# Patient Record
Sex: Female | Born: 1945
Health system: Southern US, Community
[De-identification: ages and names within clinical notes are randomized; demographics above are authoritative.]

## PROBLEM LIST (undated history)

## (undated) DIAGNOSIS — R609 Edema, unspecified: Secondary | ICD-10-CM

## (undated) DIAGNOSIS — I10 Essential (primary) hypertension: Secondary | ICD-10-CM

## (undated) DIAGNOSIS — E119 Type 2 diabetes mellitus without complications: Secondary | ICD-10-CM

## (undated) DIAGNOSIS — R42 Dizziness and giddiness: Secondary | ICD-10-CM

## (undated) DIAGNOSIS — R079 Chest pain, unspecified: Secondary | ICD-10-CM

## (undated) HISTORY — DX: Edema, unspecified: R60.9

## (undated) HISTORY — DX: Dizziness and giddiness: R42

## (undated) HISTORY — PX: OTHER SURGICAL HISTORY: SHX169

## (undated) HISTORY — DX: Type 2 diabetes mellitus without complications: E11.9

## (undated) HISTORY — DX: Essential (primary) hypertension: I10

## (undated) HISTORY — DX: Chest pain, unspecified: R07.9

## (undated) HISTORY — PX: TOTAL ABDOMINAL HYSTERECTOMY W/ BILATERAL SALPINGOOPHORECTOMY: SHX83

---

## 1998-12-06 ENCOUNTER — Ambulatory Visit (HOSPITAL_COMMUNITY): Admission: RE | Admit: 1998-12-06 | Discharge: 1998-12-06 | Payer: Self-pay | Admitting: Internal Medicine

## 1998-12-06 ENCOUNTER — Encounter: Payer: Self-pay | Admitting: Internal Medicine

## 1998-12-27 ENCOUNTER — Encounter: Admission: RE | Admit: 1998-12-27 | Discharge: 1999-03-27 | Payer: Self-pay | Admitting: Internal Medicine

## 2004-03-27 ENCOUNTER — Ambulatory Visit: Payer: Self-pay | Admitting: Internal Medicine

## 2004-04-02 ENCOUNTER — Ambulatory Visit: Payer: Self-pay | Admitting: Internal Medicine

## 2004-08-06 ENCOUNTER — Ambulatory Visit: Payer: Self-pay | Admitting: Internal Medicine

## 2004-10-17 ENCOUNTER — Ambulatory Visit: Payer: Self-pay | Admitting: Internal Medicine

## 2005-03-11 ENCOUNTER — Ambulatory Visit: Payer: Self-pay | Admitting: Internal Medicine

## 2005-03-14 ENCOUNTER — Ambulatory Visit: Payer: Self-pay

## 2006-07-22 ENCOUNTER — Ambulatory Visit: Payer: Self-pay | Admitting: Internal Medicine

## 2006-07-22 LAB — CONVERTED CEMR LAB
ALT: 12 units/L (ref 0–40)
AST: 15 units/L (ref 0–37)
Albumin: 3.4 g/dL — ABNORMAL LOW (ref 3.5–5.2)
Alkaline Phosphatase: 54 units/L (ref 39–117)
BUN: 14 mg/dL (ref 6–23)
Basophils Absolute: 0.1 10*3/uL (ref 0.0–0.1)
Basophils Relative: 1.7 % — ABNORMAL HIGH (ref 0.0–1.0)
Bilirubin Urine: NEGATIVE
Bilirubin, Direct: 0.1 mg/dL (ref 0.0–0.3)
CO2: 35 meq/L — ABNORMAL HIGH (ref 19–32)
Calcium: 9.8 mg/dL (ref 8.4–10.5)
Chloride: 106 meq/L (ref 96–112)
Cholesterol: 145 mg/dL (ref 0–200)
Creatinine, Ser: 0.7 mg/dL (ref 0.4–1.2)
Eosinophils Absolute: 0.1 10*3/uL (ref 0.0–0.6)
Eosinophils Relative: 2 % (ref 0.0–5.0)
GFR calc Af Amer: 110 mL/min
GFR calc non Af Amer: 91 mL/min
Glucose, Bld: 150 mg/dL — ABNORMAL HIGH (ref 70–99)
HCT: 36.6 % (ref 36.0–46.0)
HDL: 52.5 mg/dL (ref >39.0)
Hemoglobin, Urine: NEGATIVE
Hemoglobin: 12.2 g/dL (ref 12.0–15.0)
Ketones, ur: NEGATIVE mg/dL
LDL Cholesterol: 81 mg/dL (ref 0–99)
Leukocytes, UA: NEGATIVE
Lymphocytes Relative: 34.9 % (ref 12.0–46.0)
MCHC: 33.3 g/dL (ref 30.0–36.0)
MCV: 76.6 fL — ABNORMAL LOW (ref 78.0–100.0)
Monocytes Absolute: 0.4 10*3/uL (ref 0.2–0.7)
Monocytes Relative: 7.2 % (ref 3.0–11.0)
Neutro Abs: 3 10*3/uL (ref 1.4–7.7)
Neutrophils Relative %: 54.2 % (ref 43.0–77.0)
Nitrite: NEGATIVE
Platelets: 272 10*3/uL (ref 150–400)
Potassium: 4 meq/L (ref 3.5–5.1)
RBC: 4.78 M/uL (ref 3.87–5.11)
RDW: 17.2 % — ABNORMAL HIGH (ref 11.5–14.6)
Sodium: 146 meq/L — ABNORMAL HIGH (ref 135–145)
Specific Gravity, Urine: >=1.03 (ref 1.000–1.03)
TSH: 2.21 microintl units/mL (ref 0.35–5.50)
Total Bilirubin: 0.6 mg/dL (ref 0.3–1.2)
Total CHOL/HDL Ratio: 2.8
Total Protein, Urine: NEGATIVE mg/dL
Total Protein: 6.7 g/dL (ref 6.0–8.3)
Triglycerides: 60 mg/dL (ref 0–149)
Urine Glucose: NEGATIVE mg/dL
Urobilinogen, UA: 0.2 (ref 0.0–1.0)
VLDL: 12 mg/dL (ref 0–40)
WBC: 5.6 10*3/uL (ref 4.5–10.5)
pH: 5.5 (ref 5.0–8.0)

## 2006-07-30 ENCOUNTER — Ambulatory Visit: Payer: Self-pay | Admitting: Internal Medicine

## 2006-07-30 LAB — CONVERTED CEMR LAB
Creatinine,U: 178.5 mg/dL
Hgb A1c MFr Bld: 7.1 % — ABNORMAL HIGH (ref 4.6–6.0)
Microalb Creat Ratio: 2.2 mg/g (ref 0.0–30.0)
Microalb, Ur: 0.4 mg/dL (ref 0.0–1.9)

## 2006-10-16 ENCOUNTER — Ambulatory Visit: Payer: Self-pay | Admitting: Internal Medicine

## 2006-10-30 ENCOUNTER — Ambulatory Visit: Payer: Self-pay | Admitting: Internal Medicine

## 2006-10-30 HISTORY — PX: COLONOSCOPY: SHX174

## 2006-10-30 LAB — HM COLONOSCOPY

## 2007-01-29 ENCOUNTER — Ambulatory Visit: Payer: Self-pay | Admitting: Internal Medicine

## 2007-01-29 LAB — CONVERTED CEMR LAB
BUN: 13 mg/dL (ref 6–23)
Bilirubin Urine: NEGATIVE
CO2: 32 meq/L (ref 19–32)
Calcium: 10 mg/dL (ref 8.4–10.5)
Chloride: 102 meq/L (ref 96–112)
Creatinine, Ser: 0.6 mg/dL (ref 0.4–1.2)
Creatinine,U: 91.9 mg/dL
Crystals: NEGATIVE
GFR calc Af Amer: 131 mL/min
GFR calc non Af Amer: 108 mL/min
Glucose, Bld: 70 mg/dL (ref 70–99)
Hemoglobin, Urine: NEGATIVE
Hgb A1c MFr Bld: 6.9 % — ABNORMAL HIGH (ref 4.6–6.0)
Ketones, ur: NEGATIVE mg/dL
Leukocytes, UA: NEGATIVE
Microalb Creat Ratio: 13.1 mg/g (ref 0.0–30.0)
Microalb, Ur: 1.2 mg/dL (ref 0.0–1.9)
Mucus, UA: NEGATIVE
Nitrite: NEGATIVE
Potassium: 3.8 meq/L (ref 3.5–5.1)
Sodium: 141 meq/L (ref 135–145)
Specific Gravity, Urine: 1.025 (ref 1.000–1.03)
Total Protein, Urine: NEGATIVE mg/dL
Urine Glucose: NEGATIVE mg/dL
Urobilinogen, UA: 0.2 (ref 0.0–1.0)
pH: 5.5 (ref 5.0–8.0)

## 2007-08-15 ENCOUNTER — Encounter: Payer: Self-pay | Admitting: *Deleted

## 2007-08-15 DIAGNOSIS — E1149 Type 2 diabetes mellitus with other diabetic neurological complication: Secondary | ICD-10-CM | POA: Insufficient documentation

## 2007-08-15 DIAGNOSIS — Z8669 Personal history of other diseases of the nervous system and sense organs: Secondary | ICD-10-CM | POA: Insufficient documentation

## 2007-08-15 DIAGNOSIS — R079 Chest pain, unspecified: Secondary | ICD-10-CM | POA: Insufficient documentation

## 2007-08-15 DIAGNOSIS — I872 Venous insufficiency (chronic) (peripheral): Secondary | ICD-10-CM | POA: Insufficient documentation

## 2007-08-15 DIAGNOSIS — E118 Type 2 diabetes mellitus with unspecified complications: Secondary | ICD-10-CM | POA: Insufficient documentation

## 2007-08-15 DIAGNOSIS — I10 Essential (primary) hypertension: Secondary | ICD-10-CM | POA: Insufficient documentation

## 2007-08-15 DIAGNOSIS — Z9889 Other specified postprocedural states: Secondary | ICD-10-CM | POA: Insufficient documentation

## 2007-08-15 DIAGNOSIS — E1165 Type 2 diabetes mellitus with hyperglycemia: Secondary | ICD-10-CM | POA: Insufficient documentation

## 2007-12-04 ENCOUNTER — Ambulatory Visit: Payer: Self-pay | Admitting: Internal Medicine

## 2007-12-04 LAB — CONVERTED CEMR LAB
BUN: 14 mg/dL (ref 6–23)
CO2: 35 meq/L — ABNORMAL HIGH (ref 19–32)
Calcium: 9.4 mg/dL (ref 8.4–10.5)
Chloride: 102 meq/L (ref 96–112)
Cholesterol: 167 mg/dL (ref 0–200)
Creatinine, Ser: 0.5 mg/dL (ref 0.4–1.2)
GFR calc Af Amer: 161 mL/min
GFR calc non Af Amer: 133 mL/min
Glucose, Bld: 143 mg/dL — ABNORMAL HIGH (ref 70–99)
HDL: 43.6 mg/dL (ref >39.0)
Hgb A1c MFr Bld: 7.3 % — ABNORMAL HIGH (ref 4.6–6.0)
LDL Cholesterol: 115 mg/dL — ABNORMAL HIGH (ref 0–99)
Potassium: 4 meq/L (ref 3.5–5.1)
Sodium: 143 meq/L (ref 135–145)
TSH: 1.42 microintl units/mL (ref 0.35–5.50)
Total CHOL/HDL Ratio: 3.8
Triglycerides: 43 mg/dL (ref 0–149)
VLDL: 9 mg/dL (ref 0–40)

## 2007-12-08 ENCOUNTER — Encounter: Payer: Self-pay | Admitting: Internal Medicine

## 2007-12-08 LAB — CONVERTED CEMR LAB: Protein, Ur: 35 mg/24hr — ABNORMAL LOW (ref 50–100)

## 2007-12-10 ENCOUNTER — Encounter: Payer: Self-pay | Admitting: Internal Medicine

## 2007-12-11 ENCOUNTER — Telehealth: Payer: Self-pay | Admitting: Internal Medicine

## 2008-03-16 ENCOUNTER — Ambulatory Visit: Payer: Self-pay | Admitting: Internal Medicine

## 2008-03-16 ENCOUNTER — Encounter: Payer: Self-pay | Admitting: Internal Medicine

## 2008-03-16 DIAGNOSIS — S51009A Unspecified open wound of unspecified elbow, initial encounter: Secondary | ICD-10-CM | POA: Insufficient documentation

## 2008-03-16 DIAGNOSIS — S7000XA Contusion of unspecified hip, initial encounter: Secondary | ICD-10-CM | POA: Insufficient documentation

## 2008-08-08 ENCOUNTER — Ambulatory Visit: Payer: Self-pay | Admitting: Internal Medicine

## 2008-08-08 DIAGNOSIS — L0201 Cutaneous abscess of face: Secondary | ICD-10-CM | POA: Insufficient documentation

## 2008-08-08 DIAGNOSIS — L03211 Cellulitis of face: Secondary | ICD-10-CM

## 2008-08-08 LAB — HM DIABETES FOOT EXAM

## 2008-08-11 ENCOUNTER — Ambulatory Visit: Payer: Self-pay | Admitting: Internal Medicine

## 2008-12-12 ENCOUNTER — Encounter: Payer: Self-pay | Admitting: Internal Medicine

## 2009-07-03 ENCOUNTER — Ambulatory Visit: Payer: Self-pay | Admitting: Internal Medicine

## 2009-07-03 DIAGNOSIS — N951 Menopausal and female climacteric states: Secondary | ICD-10-CM | POA: Insufficient documentation

## 2009-07-03 DIAGNOSIS — J069 Acute upper respiratory infection, unspecified: Secondary | ICD-10-CM | POA: Insufficient documentation

## 2009-07-03 LAB — CONVERTED CEMR LAB
BUN: 10 mg/dL (ref 6–23)
CO2: 34 meq/L — ABNORMAL HIGH (ref 19–32)
Calcium: 9.3 mg/dL (ref 8.4–10.5)
Chloride: 105 meq/L (ref 96–112)
Creatinine, Ser: 0.6 mg/dL (ref 0.4–1.2)
FSH: 35 milliintl units/mL
GFR calc non Af Amer: 129.87 mL/min (ref >60)
Glucose, Bld: 146 mg/dL — ABNORMAL HIGH (ref 70–99)
Hgb A1c MFr Bld: 8 % — ABNORMAL HIGH (ref 4.6–6.5)
LH: 21 milliintl units/mL
Potassium: 3.6 meq/L (ref 3.5–5.1)
Sodium: 143 meq/L (ref 135–145)
TSH: 2.16 microintl units/mL (ref 0.35–5.50)

## 2009-07-06 ENCOUNTER — Encounter: Payer: Self-pay | Admitting: Internal Medicine

## 2009-07-06 LAB — CONVERTED CEMR LAB
Collection Interval-CRCL: 24 hr
Creatinine 24 HR UR: 1108 mg/24hr (ref 700–1800)
Creatinine Clearance: 108 mL/min (ref 75–115)
Creatinine, Urine: 110.8 mg/dL
Protein, Ur: 20 mg/24hr — ABNORMAL LOW (ref 50–100)

## 2009-07-08 ENCOUNTER — Encounter: Payer: Self-pay | Admitting: Internal Medicine

## 2009-12-12 ENCOUNTER — Telehealth: Payer: Self-pay | Admitting: Internal Medicine

## 2009-12-12 ENCOUNTER — Encounter: Payer: Self-pay | Admitting: Internal Medicine

## 2009-12-12 LAB — HM DIABETES EYE EXAM

## 2010-05-17 NOTE — Progress Notes (Signed)
     Diabetes Management Exam:    Eye Exam:       Eye Exam done elsewhere          Date: 12/12/2009          Results: diabetic retinopathy          Done by: Dr Antony Contras

## 2010-05-17 NOTE — Letter (Signed)
[facility] [address] [place], [place]  [address] Phone: [phone]      July 11, 2009   Kuuipo Russom [address] [place], [place] [address]  RE:  LAB RESULTS  Dear  Ms. Wehrman,  The following is an interpretation of your most recent lab tests.  Please take note of any instructions provided or changes to medications that have resulted from your lab work.  ELECTROLYTES:  Good - no changes needed  KIDNEY FUNCTION TESTS:  Good - no changes needed   THYROID STUDIES:  Thyroid studies normal TSH: 2.16     DIABETIC STUDIES:  Poor - schedule a follow-up appointment soon Blood Glucose: 146   HgbA1C: 8.0   Microalbumin/Creatinine Ratio: 13.1     Hormone levels - LH and FSH are consistent with post-menopausal state.  24 hurf urine studies reflect normal kidney function.    Blood sugar and A1C levels are too high indicating poor diabetic control. Please schedule an appointment so that we can develop a strategy for bringing your diabetes under better control.    Sincerely Yours,    Jacques Navy MD  Patient: Sherri Sear Note: All result statuses are Final unless otherwise noted.  Tests: (1) BMP (METABOL)   Sodium                    143 mEq/L                   135-145   Potassium                 3.6 mEq/L                   3.5-5.1   Chloride                  105 mEq/L                   96-112   Carbon Dioxide       [H]  34 mEq/L                    19-32   Glucose              [H]  146 mg/dL                   29-56   BUN                       10 mg/dL                    2-13   Creatinine                0.6 mg/dL                   0.8-6.5   Calcium                   9.3 mg/dL                   7.8-46.9   GFR                       129.87 mL/min               >60  Tests: (2) Hemoglobin A1C (A1C)   Hemoglobin A1C       [H]  8.0 %                       4.6-6.5     Glycemic Control Guidelines for People with Diabetes:     Non Diabetic:  <6%   Goal of Therapy: <7%     Additional Action Suggested:  >8%   Tests: (3) Luteinizing Hormone (LH)   Leutinizing Hormone       21.00 mIU/mL     Female Reference Range:     20-70 yrs     1.5-9.3 mIU/mL     >70 yrs       3.1-35.6 mIU/mL     Female Reference Range:     Follicular Phase     1.9-12.5 mIU/mL     Midcycle             8.7-76.3 mIU/mL     Luteal Phase         0.5-16.9 mIU/mL       Post Menopausal      15.9-54.0 mIU/mL     Pregnant             <1.5 mIU/mL     Contraceptives       0.7-5.6 mIU/mL  Tests: (4) FSH (FSH)   FSH                       35.0 mIU/ML     Female Reference Range:  1.4-18.1 mIU/mL     Female Reference Range:     Follicular Phase          2.5-10.2 mIU/mL     MidCycle Peak          3.4-33.4 mIU/mL     Luteal Phase          1.5-9.1 mIU/mL     Post Menopausal     23.0-116.3 mIU/mL     Pregnant          <0.3 mIU/mL  Tests: (5) TSH (TSH)   FastTSH                   2.16 uIU/mL                 0.35-5.50  Patient: Telesha Sieling Note: All result statuses are Final unless otherwise noted.  Tests: (1) Creatinine Clearance ([address]) ! Creatinine                0.71 mg/dL                  0.40-1.20 ! Total Volume, Urine       1000 mL   Collection Interval       24 hours   Creatinine, Urine         110.8 mg/dL  Creatinine, 24 Hr Urine                             1108 mg/day                 [phone]   Creatinine Clearance      108 mL/min                  75-115     To convert to Grams, divide mg by 1000  Tests: (2) Total Protein, Urine Timed ([address]) ! Total Volume, Urine       1000 mL ! Collection Interval  24 hours   Protein, 24 Hr Urine [L]  20 mg/day                   50-100

## 2010-05-17 NOTE — Assessment & Plan Note (Signed)
Summary: f/u app and meds/#/ cd   Vital Signs:  Patient profile:   65 year old female Height:      61 inches (154.94 cm) Weight:      246.50 pounds (112.05 kg) BMI:     46.74 O2 Sat:      94 % on Room air Temp:     97.8 degrees F (36.56 degrees C) oral Pulse rate:   80 / minute BP sitting:   118 / 72  (left arm)  Vitals Entered By: Bill Salinas CMA (2009-07-18 11:07 AM) Taken by Sydell Axon, SMA  O2 Flow:  Room air CC: pt here for a follow up and cold symptoms X4 days. pt  last mammogram 2 years ago, but unsure of the date per pt/[place]   Primary Care Provider:  Illene Regulus, MD  CC:  pt here for a follow up and cold symptoms X4 days. pt  last mammogram 2 years ago and but unsure of the date per pt/[place].  History of Present Illness: Presents with a 5 day h/o initial sore throat; deep cough - dark phlegm with a bad taste; low grade fevers over the w/e; mild SOB; no N/V; no sinus pain. She has been taking APAP cold medicine ([facility] cold medicine)  Paient with persistent swelling of the lower extremities which does not resolve over night.  She is due for refill on her meds.   Current Medications (verified): 1)  Glucotrol Xl 10 Mg  Tb24 (Glipizide) .... Take 1 Tablet By Mouth Once A Day 2)  Hydrochlorothiazide 25 Mg  Tabs (Hydrochlorothiazide) .... Take 1 Tablet By Mouth Once A Day 3)  Glucophage 1000 Mg  Tabs (Metformin Hcl) .... Take 1 Tablet By Mouth Two Times A Day 4)  Cardura 2 Mg  Tabs (Doxazosin Mesylate) .... Take 1 Tablet By Mouth Once A Day 5)  Actos 30 Mg  Tabs (Pioglitazone Hcl) .... Take 1 Tablet By Mouth Once A Day  Allergies (verified): No Known Drug Allergies  Past History:  Past Medical History: Last updated: 08/15/2007 VERTIGO, HX OF (ICD-V12.49) Hx of CHEST PAIN (ICD-786.50) Hx of PERIPHERAL EDEMA (ICD-782.3) HYPERTENSION (ICD-401.9) DIABETES MELLITUS (ICD-250.00)    Past Surgical History: Last updated: 08/15/2007 * Hx of TAH/BSO DUE TO  FIBROID TUMORS AND UTERINE BLEEDING DILATION AND CURETTAGE, HX OF (ICD-V45.89)  Family History: Last updated: 2009/07/18 Father - deceased - age ? Mother- deceased @ 80: CHF CAD, DM, HTN,  Neg - breast or colon cancer, CVA  Social History: Last updated: 03/16/2008 Married: 1970 Retired: Science writer Never Smoked Alcohol use-no Drug use-no Regular exercise-no  Family History: Father - deceased - age ? Mother- deceased @ 20: CHF CAD, DM, HTN,  Neg - breast or colon cancer, CVA  Review of Systems       The patient complains of fever and dyspnea on exertion.  The patient denies anorexia, weight loss, weight gain, decreased hearing, hoarseness, chest pain, syncope, peripheral edema, headaches, abdominal pain, muscle weakness, abnormal bleeding, and enlarged lymph nodes.    Physical Exam  General:  overweight AA female in no distress Head:  no sinus tenderness., Atraumatic Eyes:  vision grossly intact, pupils equal, pupils round, corneas and lenses clear, and no injection.   Ears:  R ear normal and L ear normal.   Nose:  no external deformity, no external erythema, and nasal dischargemucosal pallor.   Mouth:  posterior pharynx clear Neck:  supple, full ROM, no thyromegaly, and no carotid bruits.  Chest Wall:  no deformities.   Lungs:  normal respiratory effort, normal breath sounds, no crackles, and no wheezes.   Heart:  normal rate and regular rhythm.   Abdomen:  obese, soft, BS + Genitalia:  deferred to s/p hysterectomy Msk:  normal ROM, no joint tenderness, no joint swelling, no joint warmth, and no joint deformities.   Pulses:  2+ radial pulses Extremities:  2+ LE edema with minmal pitting Neurologic:  alert & oriented X3, cranial nerves II-XII intact, gait normal, and DTRs symmetrical and normal.   Skin:  turgor normal, color normal, no rashes, and no ulcerations.   Cervical Nodes:  no anterior cervical adenopathy and no posterior cervical adenopathy.   Psych:   Oriented X3, memory intact for recent and remote, normally interactive, and good eye contact.    Diabetes Management Exam:    Eye Exam:       Eye Exam done elsewhere          Date: 12/12/2008          Results: normal          Done by: Dr., Eulah Pont   Impression & Recommendations:  Problem # 1:  URI (ICD-465.9) No evidence of bacterial infection   Planj - supportive care - see pt instr.   Problem # 2:  Hx of PERIPHERAL EDEMA (ICD-782.3)  worsening problem in a diabetic pateint  Plan - 24 hr Urine for total protein and Creatinine clearance.   Her updated medication list for this problem includes:    Hydrochlorothiazide 25 Mg Tabs (Hydrochlorothiazide) .Marland Kitchen... Take 1 tablet by mouth once a day  Orders: T-Urine 24 Hr. Protein [phone]) T-Urine 24 Hr. Creatinine Clearance [phone])  Problem # 3:  HOT FLASHES (ICD-627.2) Will need to r/o hormonal deficiency as cause  Plan - lab: FSH, LH  Orders: TLB-Luteinizing Hormone (LH) (83002-LH) TLB-FSH (Follicle Stimulating Hormone) (83001-FSH) TLB-TSH (Thyroid Stimulating Hormone) (84443-TSH)  Problem # 4:  HYPERTENSION (ICD-401.9)  Her updated medication list for this problem includes:    Hydrochlorothiazide 25 Mg Tabs (Hydrochlorothiazide) .Marland Kitchen... Take 1 tablet by mouth once a day    Cardura 2 Mg Tabs (Doxazosin mesylate) .Marland Kitchen... Take 1 tablet by mouth once a day  Orders: TLB-BMP (Basic Metabolic Panel-BMET) (80048-METABOL)  BP today: 118/72 Prior BP: 140/88 (08/08/2008)  Prior 10 Yr Risk Heart Disease: > 32 % (08/08/2008)  Good control. due for routine lab: Bmet  Problem # 5:  DIABETES MELLITUS (ICD-250.00) Due for routine lab with recommendations to follow: A1C, 24 hr Urine eval, lipid panel with last LDL '09 of 116 - above goal.   Her updated medication list for this problem includes:    Glucotrol Xl 10 Mg Tb24 (Glipizide) .Marland Kitchen... Take 1 tablet by mouth once a day    Glucophage 1000 Mg Tabs (Metformin hcl) .Marland Kitchen...  Take 1 tablet by mouth two times a day    Actos 30 Mg Tabs (Pioglitazone hcl) .Marland Kitchen... Take 1 tablet by mouth once a day  Orders: T-Urine 24 Hr. Creatinine Clearance ([address]) TLB-A1C / Hgb A1C (Glycohemoglobin) (83036-A1C)  Complete Medication List: 1)  Glucotrol Xl 10 Mg Tb24 (Glipizide) .... Take 1 tablet by mouth once a day 2)  Hydrochlorothiazide 25 Mg Tabs (Hydrochlorothiazide) .... Take 1 tablet by mouth once a day 3)  Glucophage 1000 Mg Tabs (Metformin hcl) .... Take 1 tablet by mouth two times a day 4)  Cardura 2 Mg Tabs (Doxazosin mesylate) .... Take 1 tablet by mouth once a day 5)  Actos 30 Mg Tabs (Pioglitazone hcl) .... Take 1 tablet by mouth once a day  Patient Instructions: 1)  Cold- no evidence of bacterial infection, thus no need for antibiotics. Plan - loratadine 10mg  once a day, may take pseudoephedrine 30mg  after supper for nocturnal congestion; sugar free Robitussin DM, tylenol for fever or aches. 2)  Diabetes - for lab today. Will also recheck renal function (last checked in '09) as possible cause of swelling 3)  High blood pressure - well controlled. 4)  Cholesterol - last checked '09 LDL 116, which is above goal of 100 or less. Plan - lipid profile today 5)  Hot flashes - will check hormone levels.  Prescriptions: ACTOS 30 MG  TABS (PIOGLITAZONE HCL) Take 1 tablet by mouth once a day  #30 Tablet x 12   Entered and Authorized by:   Jacques Navy MD   Signed by:   Jacques Navy MD on 07/03/2009   Method used:   Electronically to        [facility] (retail)       [address]       [place], [place]  [address]       Ph: [phone] or [phone]       Fax: [phone]   RxID:   [id number] CARDURA 2 MG  TABS (DOXAZOSIN MESYLATE) Take 1 tablet by mouth once a day  #30 Tablet x 12   Entered and Authorized by:   Jacques Navy MD   Signed by:   Jacques Navy MD on 07/03/2009   Method used:   Electronically to         [facility] (retail)       [address]       [place], [place]  [address]       Ph: [phone] or [phone]       Fax: [phone]   RxID:   [id number] GLUCOPHAGE 1000 MG  TABS (METFORMIN HCL) Take 1 tablet by mouth two times a day  #60 Tablet x 12   Entered and Authorized by:   Jacques Navy MD   Signed by:   Jacques Navy MD on 07/03/2009   Method used:   Electronically to        [facility] (retail)       [address]       [place], [place]  [address]       Ph: [phone] or [phone]       Fax: [phone]   RxID:   [id number] HYDROCHLOROTHIAZIDE 25 MG  TABS (HYDROCHLOROTHIAZIDE) Take 1 tablet by mouth once a day  #30 Tablet x 12   Entered and Authorized by:   Jacques Navy MD   Signed by:   Jacques Navy MD on 07/03/2009   Method used:   Electronically to        [facility] (retail)       [address]       [place], [place]  [address]       Ph: [phone] or [phone]       Fax: [phone]   RxID:   [id number] GLUCOTROL XL 10 MG  TB24 (GLIPIZIDE) Take 1 tablet by mouth once a day  #30  Tablet x 12   Entered and Authorized by:   Jacques Navy MD   Signed by:   Jacques Navy MD on 07/03/2009   Method used:   Electronically to        [facility] (retail)       [address]       [place], [place]  [address]       Ph: [phone] or [phone]       Fax: [phone]   RxID:   [id number]

## 2010-05-17 NOTE — Letter (Signed)
Summary: [facility]   Imported By: Sherian Rein 12/15/2009 07:59:41  _____________________________________________________________________  External Attachment:    Type:   Image     Comment:   External Document

## 2010-07-25 ENCOUNTER — Other Ambulatory Visit: Payer: Self-pay | Admitting: Internal Medicine

## 2010-08-31 NOTE — Assessment & Plan Note (Signed)
[facility]                           PRIMARY CARE OFFICE NOTE   ARIBA, LEHNEN                       MRN:          [id number]  DATE:07/30/2006                            DOB:          [birth year]    Ms. Cheryl Delgado is a 65 year old African American woman who presents for  followup evaluation and exam.  She is followed for her diabetes and  hypertension.  The patient was last seen in the office March 11, 2005.  In the interval, she reports that she has been feeling well and  doing well with no specific medical problems.  She has continued to take  her diabetic medication, but only rarely monitors her blood sugars.   PAST MEDICAL HISTORY:   SURGICAL:  1. D&C in the past.  2. TAH/BSO secondary to fibroid tumors and dysfunctional uterine      bleeding.  3. Dental surgery.   MEDICAL ILLNESSES:  1. Usual childhood disease.  2. Diabetes.  3. Hypertension.   MEDICATIONS:  1. Glucotrol  XL 10 mg daily.  2. Glucophage 1000 mg b.i.d.  3. Cardura 2 mg daily.  4. Hydrochlorothiazide 25 mg daily.  5. Actos 30 mg daily.   FAMILY HISTORY:  Noncontributory.   SOCIAL HISTORY:  The patient is retired after working for [employer] for many years.  She has been married 38 years.   REVIEW OF SYSTEMS:  The patient has had no constitutional symptoms.  She  has had an eye exam and does get seen every 6 months for cataract and  early retinopathy.  She does have partial denture but has no active  dental problems.  No cardiovascular, respiratory complaints.  She does  have occasional constipation.  No GU problems.  The patient does have  some osteoarthritis of the left 5th digit and also some pain and  discomfort in her right knee, with occasional swelling.  No dermatologic  or neurologic complaints.   CHART REVIEW:  The last colorectal exam was a flex sig in 2002.   Last nuclear cardiology study, March 14, 2005, was read out as  negative  for any pharmacological stress abnormalities.  Last 2-D echo  from July 01, 2001 with an EF of 60% with no focal wall motion  abnormalities.  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   The patient had abdominal ultrasound April 02, 2004 which showed  moderate increase hepatic echodensity with no other abnormalities noted.   The patient had last mammogram January 05, 2003, and it was  unremarkable.   The patient had an x-ray of the left knee for pain and swelling which  showed no significant abnormality.   Last 12-lead electrocardiogram from March 11, 2005 showed no  significant change from previous studies with a sinus rhythm and mild ST-  T wave changes.   EXAMINATION:  Temperature was 98.  Blood pressure was 132/76.  Pulse was  73.  Weight 229.  Height 5 foot 4 inches.  GENERAL APPEARANCE:  This is an overweight African American woman in no  acute distress.  HEENT:  Normocephalic, atraumatic.  EACs and TMs were unremarkable.  Oropharynx reveals an upper partial denture.  No buccal lesions were  noted.  Posterior pharynx was clear.  Conjunctivae, sclerae were clear.  Pupils equal, round and reactive.  Further exam deferred to  ophthalmology.  NECK:  Supple without thyromegaly.  No lymphadenopathy was noted in the  cervical supraclavicular regions.  CHEST:  No CVA tenderness.  LUNGS:  Clears with no wheezes, rales or rhonchi.  BREAST:  Skin was normal.  Nipples without discharge.  The patient has  minor fibrocystic type changes, but no fixed mass, lesion or  abnormality.  There is no axillary adenopathy.  CARDIOVASCULAR:  Two-plus radial pulse, no JVD, no carotid bruits.  She  had a quiet precordium with regular rate and rhythm, without murmurs,  rubs  or gallops.  ABDOMEN:  Soft, no guarding, no rebound.  No organosplenomegaly was  appreciated.  PELVIC:  Deferred with the patient being status post hysterectomy.  RECTAL:  Exam deferred with patient going to be coming up for  colonoscopy.  EXTREMITIES:  Without clubbing, cyanosis, edema or deformity.  NEUROLOGIC:  The patient has well-preserved sensation.  Normal DTRs.   LABORATORY:  Hemoglobin was 12.2 grams, white count was 5,600, MCV was  slightly low at 76.6.  Chemistries with a sodium of 146, potassium 4,  glucose was 150.  Kidney function normal with a creatinine of 0.7.  Liver functions were normal.  Cholesterol 145, triglycerides 60, HDL  52.5, LDL was 81.  Thyroid function normal with a TSH of 2.21.  Urinalysis was unremarkable.  Hemoglobin A1c was 7.1%.  The patient was  negative for any microalbuminuria.   ASSESSMENT AND PLAN:  1. Diabetes.  The patient is adequately controlled on her present      medical regimen with a hemoglobin A1c that is very very close to      goal.  Plan is to continue her present medications.  2. Hypertension.  The patient's blood pressure is adequately      controlled on her present regimen.  She will continue the same.  3. Orthopedic.  The patient has no real significant orthopedic      problems.  If she continues to have knee discomfort, I would      recommend over-the-counter anti-inflammatory medication.  4. Health maintenance.  The patient is referred to GI for a      colonoscopy.  She is scheduled for a mammography at [facility]      [facility] for April 17 at 9:00 and she is aware of this      appointment.  The patient is current and up to date with her      ophthalmologist.   SUMMARY:  A very pleasant woman who seems to be medically stable with  her medical problems being well managed.  Mammography and colonoscopy  pending as noted.     Rosalyn Gess Norins, MD  Electronically Signed   MEN/MedQ  DD: 07/31/2006  DT: 07/31/2006  Job  #: [id number]   cc:   Dickie La., M.D.

## 2010-09-04 ENCOUNTER — Other Ambulatory Visit: Payer: Self-pay | Admitting: Internal Medicine

## 2010-10-08 ENCOUNTER — Other Ambulatory Visit: Payer: Self-pay | Admitting: Internal Medicine

## 2010-10-15 ENCOUNTER — Encounter: Payer: Self-pay | Admitting: Internal Medicine

## 2010-10-16 ENCOUNTER — Other Ambulatory Visit (INDEPENDENT_AMBULATORY_CARE_PROVIDER_SITE_OTHER): Payer: Managed Care, Other (non HMO)

## 2010-10-16 ENCOUNTER — Ambulatory Visit (INDEPENDENT_AMBULATORY_CARE_PROVIDER_SITE_OTHER): Payer: Managed Care, Other (non HMO) | Admitting: Internal Medicine

## 2010-10-16 VITALS — BP 120/80 | HR 75 | Temp 98.0°F | Wt 240.0 lb

## 2010-10-16 DIAGNOSIS — Z23 Encounter for immunization: Secondary | ICD-10-CM

## 2010-10-16 DIAGNOSIS — I10 Essential (primary) hypertension: Secondary | ICD-10-CM

## 2010-10-16 DIAGNOSIS — R609 Edema, unspecified: Secondary | ICD-10-CM

## 2010-10-16 DIAGNOSIS — Z1239 Encounter for other screening for malignant neoplasm of breast: Secondary | ICD-10-CM

## 2010-10-16 DIAGNOSIS — E119 Type 2 diabetes mellitus without complications: Secondary | ICD-10-CM

## 2010-10-16 DIAGNOSIS — Z136 Encounter for screening for cardiovascular disorders: Secondary | ICD-10-CM

## 2010-10-16 DIAGNOSIS — E669 Obesity, unspecified: Secondary | ICD-10-CM

## 2010-10-16 DIAGNOSIS — Z0000 Encounter for general adult medical examination without abnormal findings: Secondary | ICD-10-CM

## 2010-10-16 LAB — COMPREHENSIVE METABOLIC PANEL
Albumin: 3.7 g/dL (ref 3.5–5.2)
CO2: 32 mEq/L (ref 19–32)
Calcium: 9.5 mg/dL (ref 8.4–10.5)
Chloride: 102 mEq/L (ref 96–112)
GFR: 115.87 mL/min (ref >60.00)
Glucose, Bld: 105 mg/dL — ABNORMAL HIGH (ref 70–99)
Potassium: 3.7 mEq/L (ref 3.5–5.1)
Sodium: 141 mEq/L (ref 135–145)
Total Protein: 7.1 g/dL (ref 6.0–8.3)

## 2010-10-16 LAB — CBC WITH DIFFERENTIAL/PLATELET
Eosinophils Relative: 2.2 % (ref 0.0–5.0)
HCT: 37.7 % (ref 36.0–46.0)
Hemoglobin: 12.3 g/dL (ref 12.0–15.0)
Lymphs Abs: 2.1 10*3/uL (ref 0.7–4.0)
Monocytes Relative: 8.9 % (ref 3.0–12.0)
Platelets: 228 10*3/uL (ref 150.0–400.0)
RBC: 4.73 Mil/uL (ref 3.87–5.11)
WBC: 6.3 10*3/uL (ref 4.5–10.5)

## 2010-10-16 LAB — TSH: TSH: 1.87 u[IU]/mL (ref 0.35–5.50)

## 2010-10-16 LAB — HEPATIC FUNCTION PANEL
ALT: 10 U/L (ref 0–35)
Total Bilirubin: 0.4 mg/dL (ref 0.3–1.2)

## 2010-10-16 LAB — HEMOGLOBIN A1C: Hgb A1c MFr Bld: 8.4 % — ABNORMAL HIGH (ref 4.6–6.5)

## 2010-10-16 LAB — LIPID PANEL: Cholesterol: 153 mg/dL (ref 0–200)

## 2010-10-16 MED ORDER — PNEUMOCOCCAL VAC POLYVALENT 25 MCG/0.5ML IJ INJ: 0.5000 mL | INJECTION | Freq: Once | INTRAMUSCULAR | Status: DC

## 2010-10-16 NOTE — Progress Notes (Signed)
Subjective:    Patient ID: Cheryl Delgado, female    DOB: 1945-11-10, 65 y.o.   MRN: [id number]  HPI The patient is here for annual wellness examination and management of other chronic and acute problems. She reports that she has been doing OK except for pedal edema which seems to have gotten worse. In the interval she has had no major medical illness, surgery or injury   The risk factors are reflected in the social history.  The roster of all physicians providing medical care to patient - is listed in the Snapshot section of the chart.  Activities of daily living:  The patient is 100% inedpendent in all ADLs: dressing, toileting, feeding as well as independent mobility  Home safety : The patient has smoke detectors in the home. They wear seatbelts. No firearms at home. No violence in the home.   There is no risks for hepatitis, STDs or HIV. There is no   history of blood transfusion. They have no travel history to infectious disease endemic areas of the world.  The patient has not seen their dentist in the last six month. They have seen their eye doctor in the last year. They deny any hearing difficulty and have not had audiologic testing in the last year.  They do not  have excessive sun exposure. Discussed the need for sun protection: hats, long sleeves and use of sunscreen if there is significant sun exposure.   Diet: the importance of a healthy diet is discussed. They do have a sort of healthy diabetic diet.  The patient has no regular exercise program.  The benefits of regular aerobic exercise were discussed.  Depression screen: there are no signs or vegative symptoms of depression- irritability, change in appetite, anhedonia, sadness/tearfullness.  Cognitive assessment: the patient manages all their financial and personal affairs and is actively engaged. They could relate day,date,year and events; recalled 3/3 objects at 3 minutes.  The following portions of the patient's history  were reviewed and updated as appropriate: allergies, current medications, past family history, past medical history,  past surgical history, past social history  and problem list.  Vision, hearing, body mass index were assessed and reviewed.   During the course of the visit the patient was educated and counseled about appropriate screening and preventive services including : fall prevention , diabetes screening, nutrition counseling, colorectal cancer screening, and recommended immunizations.  Past Medical History  Diagnosis Date  . Hypertension   . Type II or unspecified type diabetes mellitus without mention of complication, not stated as uncontrolled   . Edema     peripheral  . Chest pain, unspecified   . Vertigo    Past Surgical History  Procedure Date  . Total abdominal hysterectomy w/ bilateral salpingoophorectomy     due to fibroid tumors and uterine bleeding  . Dialtion and curettage    Family History  Problem Relation Age of Onset  . Breast cancer Neg Hx   . Colon cancer Neg Hx   . Stroke Neg Hx    History   Social History  . Marital Status: Married    Spouse Name: N/A    Number of Children: N/A  . Years of Education: N/A   Occupational History  . retired     Science writer   Social History Main Topics  . Smoking status: Never Smoker   . Smokeless tobacco: Not on file  . Alcohol Use: No  . Drug Use: No  . Sexually Active: Not  on file   Other Topics Concern  . Not on file   Social History Narrative   Regular exercise-No. Married 1970.       Review of Systems Review of Systems  Constitutional:  Negative for fever, chills, activity change and unexpected weight change.  HEENT:  Negative for hearing loss, ear pain, congestion, neck stiffness and postnasal drip. Negative for sore throat or swallowing problems. Negative for dental complaints.   Eyes: Negative for vision loss or change in visual acuity.  Respiratory: Negative for chest tightness  and wheezing.   Cardiovascular: Negative for chest pain and palpitation. No decreased exercise tolerance Gastrointestinal: No change in bowel habit. No bloating or gas. No reflux or indigestion Genitourinary: Negative for urgency, frequency, flank pain and difficulty urinating.  Musculoskeletal: Negative for myalgias, back pain, arthralgias and gait problem.  Neurological: Negative for dizziness, tremors, weakness and headaches.  Hematological: Negative for adenopathy.  Psychiatric/Behavioral: Negative for behavioral problems and dysphoric mood.       Objective:   Physical Exam Vitals reviewed.BMI 43.9 based on height of 5'2" Gen'l: well nourished, well developed, very overweight AA woman in no distress HEENT - [place]/AT, EACs/TMs normal, oropharynx with upper denture and native dentition mandible in good condition, no buccal or palatal lesions, posterior pharynx clear, mucous membranes moist. C&S clear, PERRLA, fundi - normal Neck - supple, no thyromegaly Nodes- negative submental, cervical, supraclavicular regions Chest - no deformity, no CVAT Lungs - cleat without rales, wheezes. No increased work of breathing Breast - skin normal, nipples without discharge, no fixed mass or lesion, no axillary nodes. Cardiovascular - regular rate and rhythm, quiet precordium, no murmurs, rubs or gallops, 2+ radial, DP and PT pulses Abdomen - BS+ x 4, no HSM, no guarding or rebound or tenderness Pelvic - deferred  Rectal - deferred  Extremities - no clubbing, cyanosis, edema or deformity.  Neuro - A&O x 3, CN II-XII normal, motor strength normal and equal, DTRs 2+ and symmetrical biceps, radial, and patellar tendons. Cerebellar - no tremor, no rigidity, fluid movement and normal gait. Derm - Head, neck, back, abdomen and extremities without suspicious lesions  Lab Results  Component Value Date   WBC 6.3 10/16/2010   HGB 12.3 10/16/2010   HCT 37.7 10/16/2010   PLT 228.0 10/16/2010   CHOL 153 10/16/2010   TRIG  70.0 10/16/2010   HDL 63.60 10/16/2010   ALT 10 10/16/2010   ALT 10 10/16/2010   AST 13 10/16/2010   AST 13 10/16/2010   NA 141 10/16/2010   K 3.7 10/16/2010   CL 102 10/16/2010   CREATININE 0.7 10/16/2010   BUN 17 10/16/2010   CO2 32 10/16/2010   TSH 1.87 10/16/2010   HGBA1C 8.4* 10/16/2010   MICROALBUR 1.2 01/29/2007   Lab Results  Component Value Date   LDLCALC 75 10/16/2010           Assessment & Plan:

## 2010-10-17 DIAGNOSIS — Z0000 Encounter for general adult medical examination without abnormal findings: Secondary | ICD-10-CM | POA: Insufficient documentation

## 2010-10-17 DIAGNOSIS — Z0001 Encounter for general adult medical examination with abnormal findings: Secondary | ICD-10-CM | POA: Insufficient documentation

## 2010-10-17 NOTE — Assessment & Plan Note (Signed)
BP Readings from Last 3 Encounters:  10/16/10 120/80  07/03/09 118/72  08/08/08 140/88   Good control on present medications

## 2010-10-17 NOTE — Assessment & Plan Note (Signed)
Very elevated BMI. Patient advised as to the health risk associated with her obesity, especially in the setting of diabetes and hypertension.  Plan - weight management: Improved food choices - strict no sugar low calorie diet; portion size control; regular exercise.

## 2010-10-17 NOTE — Assessment & Plan Note (Signed)
Interval history notable for progressive leg swelling and poorly controlled diabetes. Physical exam, sans pelvic, notable for obesity and leg swelling with no skin breakdown. Lab results are in normal limits except for A1C. She is overdue for mammography and study to be scheduled. She is current with colorectal cancer screening with last study July '08. Immunizations: Tetanus Dec '09; she is due for pneumonia and shingles vaccine. 12 lead EKG is normal with no signs of ischemia or injury.  In summary - a very nice woman who needs better control of her diabetes, weight and peripheral edema. She will be asked to return for consultation on medical management of diabetes.

## 2010-10-17 NOTE — Assessment & Plan Note (Signed)
A1C reveals diabetes to be out of control at 8+ %.  Plan - return office visit to adjust medical management

## 2010-10-17 NOTE — Assessment & Plan Note (Signed)
Progressive problem with swelling now being constant with no change after elevation of legs. She has had no skin break-down.  Plan - will refer to vein specialist for evaluation

## 2010-10-18 ENCOUNTER — Encounter: Payer: Self-pay | Admitting: Internal Medicine

## 2010-11-14 ENCOUNTER — Other Ambulatory Visit: Payer: Self-pay | Admitting: Internal Medicine

## 2010-12-19 ENCOUNTER — Other Ambulatory Visit: Payer: Self-pay | Admitting: *Deleted

## 2010-12-19 MED ORDER — HYDROCHLOROTHIAZIDE 25 MG PO TABS: 25.0000 mg | ORAL_TABLET | Freq: Every day | ORAL | Status: DC

## 2010-12-19 MED ORDER — DOXAZOSIN MESYLATE 2 MG PO TABS: 2.0000 mg | ORAL_TABLET | Freq: Every day | ORAL | Status: DC

## 2010-12-19 MED ORDER — PIOGLITAZONE HCL 30 MG PO TABS: 30.0000 mg | ORAL_TABLET | Freq: Every day | ORAL | Status: DC

## 2010-12-19 MED ORDER — METFORMIN HCL 1000 MG PO TABS: 500.0000 mg | ORAL_TABLET | Freq: Two times a day (BID) | ORAL | Status: DC

## 2010-12-19 MED ORDER — GLIPIZIDE ER 10 MG PO TB24: 10.0000 mg | ORAL_TABLET | Freq: Every day | ORAL | Status: DC

## 2010-12-20 ENCOUNTER — Telehealth: Payer: Self-pay | Admitting: *Deleted

## 2010-12-20 NOTE — Telephone Encounter (Signed)
Patient requesting a call regarding med change at last OV.

## 2010-12-24 MED ORDER — METFORMIN HCL 1000 MG PO TABS: 1000.0000 mg | ORAL_TABLET | Freq: Two times a day (BID) | ORAL | Status: DC

## 2010-12-24 NOTE — Telephone Encounter (Signed)
Ok to update med list. THANK YOU

## 2010-12-24 NOTE — Telephone Encounter (Signed)
Spoke w/pt - She says she takes glucophage 1000 mg bid # 60. Our records show 1/2 tab bid. She was not aware of need for any change. OK to update to 1 tab bid?

## 2011-06-28 ENCOUNTER — Other Ambulatory Visit: Payer: Self-pay

## 2011-06-28 MED ORDER — DOXAZOSIN MESYLATE 2 MG PO TABS: 2.0000 mg | ORAL_TABLET | Freq: Every day | ORAL | Status: DC

## 2011-06-28 MED ORDER — HYDROCHLOROTHIAZIDE 25 MG PO TABS: 25.0000 mg | ORAL_TABLET | Freq: Every day | ORAL | Status: DC

## 2011-06-28 MED ORDER — GLIPIZIDE ER 10 MG PO TB24: 10.0000 mg | ORAL_TABLET | Freq: Every day | ORAL | Status: DC

## 2011-07-02 ENCOUNTER — Other Ambulatory Visit: Payer: Self-pay

## 2011-07-02 MED ORDER — HYDROCHLOROTHIAZIDE 25 MG PO TABS: 25.0000 mg | ORAL_TABLET | Freq: Every day | ORAL | Status: DC

## 2011-07-02 MED ORDER — METFORMIN HCL 1000 MG PO TABS: 1000.0000 mg | ORAL_TABLET | Freq: Two times a day (BID) | ORAL | Status: DC

## 2011-07-10 ENCOUNTER — Other Ambulatory Visit: Payer: Self-pay

## 2011-07-10 MED ORDER — PIOGLITAZONE HCL 30 MG PO TABS: 30.0000 mg | ORAL_TABLET | Freq: Every day | ORAL | Status: DC

## 2011-12-31 ENCOUNTER — Other Ambulatory Visit: Payer: Self-pay | Admitting: Internal Medicine

## 2012-01-20 ENCOUNTER — Encounter: Payer: Self-pay | Admitting: Internal Medicine

## 2012-01-20 ENCOUNTER — Ambulatory Visit (INDEPENDENT_AMBULATORY_CARE_PROVIDER_SITE_OTHER): Payer: Managed Care, Other (non HMO) | Admitting: Internal Medicine

## 2012-01-20 ENCOUNTER — Other Ambulatory Visit (INDEPENDENT_AMBULATORY_CARE_PROVIDER_SITE_OTHER): Payer: Managed Care, Other (non HMO)

## 2012-01-20 ENCOUNTER — Telehealth: Payer: Self-pay | Admitting: Internal Medicine

## 2012-01-20 VITALS — BP 132/82 | HR 72 | Temp 98.0°F | Ht 61.0 in | Wt 249.0 lb

## 2012-01-20 DIAGNOSIS — L600 Ingrowing nail: Secondary | ICD-10-CM

## 2012-01-20 DIAGNOSIS — L03019 Cellulitis of unspecified finger: Secondary | ICD-10-CM

## 2012-01-20 DIAGNOSIS — IMO0001 Reserved for inherently not codable concepts without codable children: Secondary | ICD-10-CM

## 2012-01-20 MED ORDER — CEPHALEXIN 500 MG PO CAPS: 500.0000 mg | ORAL_CAPSULE | Freq: Three times a day (TID) | ORAL | Status: DC

## 2012-01-20 NOTE — Patient Instructions (Signed)
It was good to see you today. We have drained your nailbed abscess (paronychia) in the office today Soak your affected finger in warm soapy water for 10 minutes 3 times a day Keflex antibiotics 3 times a day - Your prescription(s) have been submitted to your pharmacy. Please take as directed and contact our office if you believe you are having problem(s) with the medication(s). we'll make referral to podiatry for your toe and nail care. Our office will contact you regarding appointment(s) once made. Medications reviewed, no other changes at this time. Test(s) ordered today for diabetes mellitus check. Your results will be released to MyChart (or called to you) after review, usually within 72hours after test completion. If any changes need to be made, you will be notified at that same time. Please schedule followup in 3 months for diabetes mellitus check with Dr [name], call sooner if problems. Paronychia Paronychia is an inflammatory reaction involving the folds of the skin surrounding the fingernail. This is commonly caused by an infection in the skin around a nail. The most common cause of paronychia is frequent wetting of the hands (as seen with bartenders, food servers, nurses or others who wet their hands). This makes the skin around the fingernail susceptible to infection by bacteria (germs) or fungus. Other predisposing factors are:  Aggressive manicuring.   Nail biting.   Thumb sucking.  The most common cause is a staphylococcal (a type of germ) infection, or a fungal (Candida) infection. When caused by a germ, it usually comes on suddenly with redness, swelling, pus and is often painful. It may get under the nail and form an abscess (collection of pus), or form an abscess around the nail. If the nail itself is infected with a fungus, the treatment is usually prolonged and may require oral medicine for up to one year. Your caregiver will determine the length of time treatment is required. The  paronychia caused by bacteria (germs) may largely be avoided by not pulling on hangnails or picking at cuticles. When the infection occurs at the tips of the finger it is called felon. When the cause of paronychia is from the herpes simplex virus (HSV) it is called herpetic whitlow. TREATMENT   When an abscess is present treatment is often incision and drainage. This means that the abscess must be cut open so the pus can get out. When this is done, the following home care instructions should be followed. HOME CARE INSTRUCTIONS    It is important to keep the affected fingers very dry. Rubber or plastic gloves over cotton gloves should be used whenever the hand must be placed in water.   Keep wound clean, dry and dressed as suggested by your caregiver between warm soaks or warm compresses.   Soak in warm water for fifteen to twenty minutes three to four times per day for bacterial infections. Fungal infections are very difficult to treat, so often require treatment for long periods of time.   For bacterial (germ) infections take antibiotics (medicine which kill germs) as directed and finish the prescription, even if the problem appears to be solved before the medicine is gone.   Only take over-the-counter or prescription medicines for pain, discomfort, or fever as directed by your caregiver.  SEEK IMMEDIATE MEDICAL CARE IF:  You have redness, swelling, or increasing pain in the wound.   You notice pus coming from the wound.   You have a fever.   You notice a bad smell coming from the wound or  dressing.  Document Released: 09/25/2000 Document Revised: 06/24/2011 Document Reviewed: 05/27/2008 [facility] Patient Information 2013 [place], [place].

## 2012-01-20 NOTE — Progress Notes (Signed)
  Subjective:    Patient ID: Cheryl Delgado, female    DOB: March 26, 1946, 66 y.o.   MRN: [id number]  HPI complains of finger pain and redness Onset 6 days ago, progressively worse Denies trauma  No hx same on same or other fingers - ?similar problem on L 4th toe last month  Past Medical History  Diagnosis Date  . Hypertension   . Type II or unspecified type diabetes mellitus without mention of complication, not stated as uncontrolled   . Edema     peripheral  . Chest pain, unspecified   . Vertigo     Review of Systems  Constitutional: Negative for fever and fatigue.  Skin: Positive for color change (4th L toe) and wound. Negative for rash.       Objective:   Physical Exam BP 132/82  Pulse 72  Temp 98 F (36.7 C) (Oral)  Ht 5\' 1"  (1.549 m)  Wt 249 lb (112.946 kg)  BMI 47.05 kg/m2  SpO2 95% Weight: 249 lb (112.946 kg)  Constitutional: She is overweight, but appears well-developed and well-nourished. No distress.  Neck: Normal range of motion. Neck supple. No JVD present. No thyromegaly present.  Cardiovascular: Normal rate, regular rhythm and normal heart sounds.  No murmur heard. No BLE edema. Pulmonary/Chest: Effort normal and breath sounds normal. No respiratory distress. She has no wheezes.  Skin: Paronychia around L 3rd nailbed -left fourth toe with darkening at interdigital fold and web space but no edema, tenderness and neurovascularly intact. Skin is warm and dry. No rash noted. No erythema.  Psychiatric: She has a normal mood and affect. Her behavior is normal. Judgment and thought content normal.   Lab Results  Component Value Date   WBC 6.3 10/16/2010   HGB 12.3 10/16/2010   HCT 37.7 10/16/2010   PLT 228.0 10/16/2010   GLUCOSE 105* 10/16/2010   CHOL 153 10/16/2010   TRIG 70.0 10/16/2010   HDL 63.60 10/16/2010   LDLCALC 75 10/16/2010   ALT 10 10/16/2010   ALT 10 10/16/2010   AST 13 10/16/2010   AST 13 10/16/2010   NA 141 10/16/2010   K 3.7 10/16/2010   CL 102 10/16/2010   CREATININE  0.7 10/16/2010   BUN 17 10/16/2010   CO2 32 10/16/2010   TSH 1.87 10/16/2010   HGBA1C 8.4* 10/16/2010   MICROALBUR 1.2 01/29/2007   Procedure: I&D of paronychia   the patient elects to proceed after verbal consent is obtained. the patient informed of possible risks and complications. Using sterile technique throughout,  I&D of abscess performed. Copious amounts of purulent material expressed followed by irrigation of the wound. the patient tolerated the procedure well. Wound care instructions provided.       Assessment & Plan:  Paronychia, left index finger -drain today. Instructions provided on soaking and wound care, also systemic antibiotics - education provided  L 4th toe discoloration- ?fungal changes - no paronychia. Also in need of nail care. Will refer to podiatry for review and care/treatment as needed  DM2 - check A1c and followup with primary care as needed

## 2012-01-20 NOTE — Telephone Encounter (Signed)
Patient calling, her middle finger on the left hand is swollen more then 2x normal size.  Denies any trauma.  The swelling started last week but worsened over the weekend.  Denies any insect bites.  The entire finger is red and painful, unable to bend completely.   The finger is gray around the fingernail.  Needs to be seen in 4 hours per Hand Non-Injury protocal. Scheduled for 1345 with Dr. Felicity Coyer.

## 2012-02-06 ENCOUNTER — Other Ambulatory Visit: Payer: Self-pay | Admitting: Internal Medicine

## 2012-04-22 ENCOUNTER — Ambulatory Visit: Payer: Medicare Other | Admitting: Internal Medicine

## 2012-06-17 ENCOUNTER — Other Ambulatory Visit: Payer: Self-pay | Admitting: *Deleted

## 2012-06-17 MED ORDER — METFORMIN HCL 1000 MG PO TABS: 1000.0000 mg | ORAL_TABLET | Freq: Two times a day (BID) | ORAL | Status: DC

## 2012-06-17 NOTE — Telephone Encounter (Signed)
PATIENT CALLED TO REQUEST REFILL ON METFORMIN, INFORMED PATIENT NEEDING OFFICE VISIT WITH DR. [name] FOR FURTHER REFILLS. PATIENT TRANSFERRED TO SCHEDULER. PATIENT STATES SHE HAS NOT BEEN FEELING WELL DUE TO BEING OUT OF METFORMIN. Rx SENT IN FOR 30 DAYS

## 2012-07-15 ENCOUNTER — Encounter: Payer: Self-pay | Admitting: Internal Medicine

## 2012-07-15 ENCOUNTER — Ambulatory Visit (INDEPENDENT_AMBULATORY_CARE_PROVIDER_SITE_OTHER): Payer: Medicare HMO | Admitting: Internal Medicine

## 2012-07-15 ENCOUNTER — Other Ambulatory Visit (INDEPENDENT_AMBULATORY_CARE_PROVIDER_SITE_OTHER): Payer: Medicare HMO

## 2012-07-15 VITALS — BP 134/78 | HR 78 | Temp 98.0°F | Resp 12 | Ht 62.0 in | Wt 241.4 lb

## 2012-07-15 DIAGNOSIS — R609 Edema, unspecified: Secondary | ICD-10-CM

## 2012-07-15 DIAGNOSIS — E119 Type 2 diabetes mellitus without complications: Secondary | ICD-10-CM

## 2012-07-15 DIAGNOSIS — I10 Essential (primary) hypertension: Secondary | ICD-10-CM

## 2012-07-15 LAB — LIPID PANEL
HDL: 55.8 mg/dL (ref >39.00)
Triglycerides: 66 mg/dL (ref 0.0–149.0)
VLDL: 13.2 mg/dL (ref 0.0–40.0)

## 2012-07-15 LAB — COMPREHENSIVE METABOLIC PANEL
ALT: 10 U/L (ref 0–35)
AST: 12 U/L (ref 0–37)
Alkaline Phosphatase: 51 U/L (ref 39–117)
Glucose, Bld: 142 mg/dL — ABNORMAL HIGH (ref 70–99)
Sodium: 138 mEq/L (ref 135–145)
Total Bilirubin: 0.6 mg/dL (ref 0.3–1.2)
Total Protein: 6.9 g/dL (ref 6.0–8.3)

## 2012-07-15 LAB — CBC WITH DIFFERENTIAL/PLATELET
Basophils Absolute: 0.1 10*3/uL (ref 0.0–0.1)
Eosinophils Absolute: 0.1 10*3/uL (ref 0.0–0.7)
HCT: 36.2 % (ref 36.0–46.0)
Lymphs Abs: 1.8 10*3/uL (ref 0.7–4.0)
MCV: 77 fl — ABNORMAL LOW (ref 78.0–100.0)
Monocytes Absolute: 0.4 10*3/uL (ref 0.1–1.0)
Platelets: 227 10*3/uL (ref 150.0–400.0)
RDW: 17.6 % — ABNORMAL HIGH (ref 11.5–14.6)

## 2012-07-15 LAB — MICROALBUMIN / CREATININE URINE RATIO
Creatinine,U: 116.3 mg/dL
Microalb Creat Ratio: 0.8 mg/g (ref 0.0–30.0)

## 2012-07-15 LAB — HEPATIC FUNCTION PANEL
ALT: 10 U/L (ref 0–35)
AST: 12 U/L (ref 0–37)
Total Bilirubin: 0.6 mg/dL (ref 0.3–1.2)

## 2012-07-15 NOTE — Assessment & Plan Note (Signed)
Worsening peripheral edema, non-pitting  Plan  24 hr urine for protein and Cr clearance

## 2012-07-15 NOTE — Progress Notes (Signed)
  Subjective:    Patient ID: Cheryl Delgado, female    DOB: December 05, 1945, 67 y.o.   MRN: [id number]  HPI Cheryl Delgado presents for follow up of several medical problems. Her Rx's have expired and OV was needed to refill.  She has been feeling well. She does report swelling at the ankles that does not diminish overnight. Her CBGs have not been well controlled.   PMH, FamHx and SocHx reviewed for any changes and relevance.  Current Outpatient Prescriptions on File Prior to Visit  Medication Sig Dispense Refill  . cephALEXin (KEFLEX) 500 MG capsule Take 1 capsule (500 mg total) by mouth 3 (three) times daily.  21 capsule  0  . doxazosin (CARDURA) 2 MG tablet TAKE ONE TABLET BY MOUTH AT BEDTIME  30 tablet  6  . GLIPIZIDE XL 10 MG 24 hr tablet TAKE ONE TABLET BY MOUTH ONE TIME DAILY  30 tablet  6  . hydrochlorothiazide (HYDRODIURIL) 25 MG tablet Take 1 tablet (25 mg total) by mouth daily.  30 tablet  5  . metFORMIN (GLUCOPHAGE) 1000 MG tablet Take 1 tablet (1,000 mg total) by mouth 2 (two) times daily with a meal.  60 tablet  0  . metFORMIN (GLUCOPHAGE) 1000 MG tablet Take 1 tablet (1,000 mg total) by mouth 2 (two) times daily with a meal.  60 tablet  5  . pioglitazone (ACTOS) 30 MG tablet TAKE ONE TABLET BY MOUTH ONE TIME DAILY  30 tablet  6   Current Facility-Administered Medications on File Prior to Visit  Medication Dose Route Frequency Provider Last Rate Last Dose  . pneumococcal 23 valent vaccine (PNU-IMMUNE) injection 0.5 mL  0.5 mL Intramuscular Once Jacques Navy, MD          Review of Systems System review is negative for any constitutional, cardiac, pulmonary, GI or neuro symptoms or complaints other than as described in the HPI.     Objective:   Physical Exam Filed Vitals:   07/15/12 1033  BP: 134/78  Pulse: 78  Temp: 98 F (36.7 C)  Resp: 12   Weight: 241 lb 6.4 oz (109.498 kg)    Gen'l - overweight AA woman in no distress Cor - RRR, no JVD, no carotid bruits PUlm -  lungs CTAP Ext- 3+ non-pitting edema at th ankles. Neuro - A&O x 3.        Assessment & Plan:

## 2012-07-15 NOTE — Assessment & Plan Note (Signed)
BP Readings from Last 3 Encounters:  07/15/12 134/78  01/20/12 132/82  10/16/10 120/80   Reasonable control.  Plan Bmet  Continue present medications

## 2012-07-15 NOTE — Patient Instructions (Addendum)
Good to see you  We will check labs today inlcuding special studies to investigate the swelling in your legs  I will see you in June for a Welcome to Medicare visit  Please sign up for MyChart

## 2012-07-15 NOTE — Assessment & Plan Note (Signed)
Patient due for lab follow up  A1C 8.1 %  Plan Will adjust medications at up-coming physical exam.

## 2012-07-20 ENCOUNTER — Other Ambulatory Visit: Payer: Self-pay

## 2012-07-20 MED ORDER — HYDROCHLOROTHIAZIDE 25 MG PO TABS: 25.0000 mg | ORAL_TABLET | Freq: Every day | ORAL | Status: DC

## 2012-07-21 ENCOUNTER — Other Ambulatory Visit: Payer: Medicare HMO

## 2012-07-21 DIAGNOSIS — R609 Edema, unspecified: Secondary | ICD-10-CM

## 2012-07-22 LAB — CREATININE CLEARANCE, URINE, 24 HOUR
Creatinine, Urine: 72.2 mg/dL
Creatinine: 0.63 mg/dL (ref 0.50–1.10)

## 2012-07-22 LAB — PROTEIN, URINE, 24 HOUR

## 2012-07-27 ENCOUNTER — Encounter: Payer: Self-pay | Admitting: Internal Medicine

## 2012-09-21 ENCOUNTER — Other Ambulatory Visit: Payer: Self-pay | Admitting: Internal Medicine

## 2012-09-22 ENCOUNTER — Encounter: Payer: Self-pay | Admitting: Internal Medicine

## 2012-09-22 ENCOUNTER — Ambulatory Visit (INDEPENDENT_AMBULATORY_CARE_PROVIDER_SITE_OTHER): Payer: Medicare HMO | Admitting: Internal Medicine

## 2012-09-22 VITALS — BP 138/76 | HR 67 | Temp 97.9°F | Resp 12 | Ht 62.0 in | Wt 243.8 lb

## 2012-09-22 DIAGNOSIS — Z1239 Encounter for other screening for malignant neoplasm of breast: Secondary | ICD-10-CM

## 2012-09-22 DIAGNOSIS — F17210 Nicotine dependence, cigarettes, uncomplicated: Secondary | ICD-10-CM

## 2012-09-22 DIAGNOSIS — I10 Essential (primary) hypertension: Secondary | ICD-10-CM

## 2012-09-22 DIAGNOSIS — F17200 Nicotine dependence, unspecified, uncomplicated: Secondary | ICD-10-CM

## 2012-09-22 DIAGNOSIS — Z0000 Encounter for general adult medical examination without abnormal findings: Secondary | ICD-10-CM

## 2012-09-22 DIAGNOSIS — E669 Obesity, unspecified: Secondary | ICD-10-CM

## 2012-09-22 DIAGNOSIS — R609 Edema, unspecified: Secondary | ICD-10-CM

## 2012-09-22 DIAGNOSIS — E119 Type 2 diabetes mellitus without complications: Secondary | ICD-10-CM

## 2012-09-22 MED ORDER — DOXAZOSIN MESYLATE 2 MG PO TABS: 2.0000 mg | ORAL_TABLET | Freq: Every day | ORAL | Status: DC

## 2012-09-22 MED ORDER — METFORMIN HCL 1000 MG PO TABS: 1000.0000 mg | ORAL_TABLET | Freq: Two times a day (BID) | ORAL | Status: DC

## 2012-09-22 MED ORDER — HYDROCHLOROTHIAZIDE 25 MG PO TABS: 25.0000 mg | ORAL_TABLET | Freq: Every day | ORAL | Status: DC

## 2012-09-22 NOTE — Patient Instructions (Addendum)
Thanks for coming in to see me.  The most important issue is getting control of the diabetes - the A1C should be 7% or less and you are at 8% on 3 oral agents. Plan Stop glucotrol, Stop Actos  Continue metformin  Start Levemir basal insulin at 36 units once a day at bedtime.  Check your blood sugar every morning before breakfast - the goal is to have a blood sugar of 150 or less every morning. If not we will increase levemir  Come back to see me in 7-10 days  Blood pressure is good  Check with your insuror about coverage for the shingles vaccine - Zostavax. We can give this at your next visit.  Please follow a good diabetic diet: sugar free and low carb; please develop a regular exercise program - at least 3 days a week. Look at [employer].   Need to stop smoking: Smoking Cessation Quitting smoking is important to your health and has many advantages. However, it is not always easy to quit since nicotine is a very addictive drug. Often times, people try 3 times or more before being able to quit. This document explains the best ways for you to prepare to quit smoking. Quitting takes hard work and a lot of effort, but you can do it. ADVANTAGES OF QUITTING SMOKING  You will live longer, feel better, and live better.  Your body will feel the impact of quitting smoking almost immediately.  Within 20 minutes, blood pressure decreases. Your pulse returns to its normal level.  After 8 hours, carbon monoxide levels in the blood return to normal. Your oxygen level increases.  After 24 hours, the chance of having a heart attack starts to decrease. Your breath, hair, and body stop smelling like smoke.  After 48 hours, damaged nerve endings begin to recover. Your sense of taste and smell improve.  After 72 hours, the body is virtually free of nicotine. Your bronchial tubes relax and breathing becomes easier.  After 2 to 12 weeks, lungs can hold more air. Exercise becomes easier and  circulation improves.  The risk of having a heart attack, stroke, cancer, or lung disease is greatly reduced.  After 1 year, the risk of coronary heart disease is cut in half.  After 5 years, the risk of stroke falls to the same as a nonsmoker.  After 10 years, the risk of lung cancer is cut in half and the risk of other cancers decreases significantly.  After 15 years, the risk of coronary heart disease drops, usually to the level of a nonsmoker.  If you are pregnant, quitting smoking will improve your chances of having a healthy baby.  The people you live with, especially any children, will be healthier.  You will have extra money to spend on things other than cigarettes. QUESTIONS TO THINK ABOUT BEFORE ATTEMPTING TO QUIT You may want to talk about your answers with your caregiver.  Why do you want to quit?  If you tried to quit in the past, what helped and what did not?  What will be the most difficult situations for you after you quit? How will you plan to handle them?  Who can help you through the tough times? Your family? Friends? A caregiver?  What pleasures do you get from smoking? What ways can you still get pleasure if you quit? Here are some questions to ask your caregiver:  How can you help me to be successful at quitting?  What medicine do you  think would be best for me and how should I take it?  What should I do if I need more help?  What is smoking withdrawal like? How can I get information on withdrawal? GET READY  Set a quit date.  Change your environment by getting rid of all cigarettes, ashtrays, matches, and lighters in your home, car, or work. Do not let people smoke in your home.  Review your past attempts to quit. Think about what worked and what did not. GET SUPPORT AND ENCOURAGEMENT You have a better chance of being successful if you have help. You can get support in many ways.  Tell your family, friends, and co-workers that you are going to  quit and need their support. Ask them not to smoke around you.  Get individual, group, or telephone counseling and support. Programs are available at [employer] and health centers. Call your local health department for information about programs in your area.  Spiritual beliefs and practices may help some smokers quit.  Download a "quit meter" on your computer to keep track of quit statistics, such as how long you have gone without smoking, cigarettes not smoked, and money saved.  Get a self-help book about quitting smoking and staying off of tobacco. LEARN NEW SKILLS AND BEHAVIORS  Distract yourself from urges to smoke. Talk to someone, go for a walk, or occupy your time with a task.  Change your normal routine. Take a different route to work. Drink tea instead of coffee. Eat breakfast in a different place.  Reduce your stress. Take a hot bath, exercise, or read a book.  Plan something enjoyable to do every day. Reward yourself for not smoking.  Explore interactive web-based programs that specialize in helping you quit. GET MEDICINE AND USE IT CORRECTLY Medicines can help you stop smoking and decrease the urge to smoke. Combining medicine with the above behavioral methods and support can greatly increase your chances of successfully quitting smoking.  Nicotine replacement therapy helps deliver nicotine to your body without the negative effects and risks of smoking. Nicotine replacement therapy includes nicotine gum, lozenges, inhalers, nasal sprays, and skin patches. Some may be available over-the-counter and others require a prescription.  Antidepressant medicine helps people abstain from smoking, but how this works is unknown. This medicine is available by prescription.  Nicotinic receptor partial agonist medicine simulates the effect of nicotine in your brain. This medicine is available by prescription. Ask your caregiver for advice about which medicines to use and how to use  them based on your health history. Your caregiver will tell you what side effects to look out for if you choose to be on a medicine or therapy. Carefully read the information on the package. Do not use any other product containing nicotine while using a nicotine replacement product.  RELAPSE OR DIFFICULT SITUATIONS Most relapses occur within the first 3 months after quitting. Do not be discouraged if you start smoking again. Remember, most people try several times before finally quitting. You may have symptoms of withdrawal because your body is used to nicotine. You may crave cigarettes, be irritable, feel very hungry, cough often, get headaches, or have difficulty concentrating. The withdrawal symptoms are only temporary. They are strongest when you first quit, but they will go away within 10 14 days. To reduce the chances of relapse, try to:  Avoid drinking alcohol. Drinking lowers your chances of successfully quitting.  Reduce the amount of caffeine you consume. Once you quit smoking, the amount of  caffeine in your body increases and can give you symptoms, such as a rapid heartbeat, sweating, and anxiety.  Avoid smokers because they can make you want to smoke.  Do not let weight gain distract you. Many smokers will gain weight when they quit, usually less than 10 pounds. Eat a healthy diet and stay active. You can always lose the weight gained after you quit.  Find ways to improve your mood other than smoking. FOR MORE INFORMATION  www.smokefree.gov  Document Released: 03/26/2001 Document Revised: 10/01/2011 Document Reviewed: 07/11/2011 [facility] Patient Information 2014 [place], [place].

## 2012-09-22 NOTE — Assessment & Plan Note (Signed)
She is adamantly counseled to stop smoking

## 2012-09-22 NOTE — Assessment & Plan Note (Signed)
BP Readings from Last 3 Encounters:  09/22/12 138/76  07/15/12 134/78  01/20/12 132/82   Adequate control on present medications  Plan Refills provided.

## 2012-09-22 NOTE — Assessment & Plan Note (Signed)
Interval history is significant for poor control of diabetes but otherwise normal. Labs reviewed - A1C elevated with remainder of labs being OK. She is current with colorectal cancer screening and is due for mammography (office to schedule). Immunizations are up to date except for shingles vaccine.  In summary - a nice woman with out of control diabetes plus weight issues. She is upset about insulin shots but reassured that this will be less of a problem then she imagines. She is to return in 7-10 days.

## 2012-09-22 NOTE — Assessment & Plan Note (Signed)
Edema became more prominent and did not resolve over night since starting Actos.  Plan  D/c Actos

## 2012-09-22 NOTE — Assessment & Plan Note (Signed)
A1C remains elevated on triple oral drug therapy  Plan Strongly recommended basal insulin therapy - levemir 36 u qHS.  She is reassured that even with her fear of needles that this will be very tolerable. Demonstration done  F/u 7-10 days.

## 2012-09-22 NOTE — Assessment & Plan Note (Signed)
Patient with Obesity III. It is very important to reduce weight.  Plan - Diet management: smart food choices, PORTION SIZE CONTROL, regular exercise. Goal - to loose 1-2 lbs.month. Target weight - 180 lbs, 4 year project

## 2012-09-22 NOTE — Progress Notes (Signed)
Subjective:    Patient ID: Cheryl Delgado, female    DOB: 04/06/1946, 67 y.o.   MRN: [id number]  HPI Ms. Gintz is here for annual Medicare wellness examination and management of other chronic and acute problems. She is doing ok. In the interval she has not had any major illness or surgery. DM has not been as well controlled as she would like.    The risk factors are reflected in the social history.  The roster of all physicians providing medical care to patient - is listed in the Snapshot section of the chart.  Activities of daily living:  The patient is 100% inedpendent in all ADLs: dressing, toileting, feeding as well as independent mobility  Home safety : The patient has smoke detectors in the home. Falls - no falls. Home is fall safe. They wear seatbelts. No firearms at home. There is no violence in the home.   There is no risks for hepatitis, STDs or HIV. There is no history of blood transfusion. They have no travel history to infectious disease endemic areas of the world.  The patient has not seen their dentist in the last six month. They have seen their eye doctor in the last year. They deny any hearing difficulty and have not had audiologic testing in the last year.    They do not  have excessive sun exposure. Discussed the need for sun protection: hats, long sleeves and use of sunscreen if there is significant sun exposure.   Diet: the importance of a healthy diet is discussed. They do have a healthy diet.  The patient has no regular exercise program.  The benefits of regular aerobic exercise were discussed.  Depression screen: there are no signs or vegative symptoms of depression- irritability, change in appetite, anhedonia, sadness/tearfullness.  Cognitive assessment: the patient manages all their financial and personal affairs and is actively engaged.   The following portions of the patient's history were reviewed and updated as appropriate: allergies, current medications,  past family history, past medical history,  past surgical history, past social history  and problem list.  Vision, hearing, body mass index were assessed and reviewed.   During the course of the visit the patient was educated and counseled about appropriate screening and preventive services including : fall prevention , diabetes screening, nutrition counseling, colorectal cancer screening, and recommended immunizations.  Past Medical History  Diagnosis Date  . Hypertension   . Type II or unspecified type diabetes mellitus without mention of complication, not stated as uncontrolled   . Edema     peripheral  . Chest pain, unspecified   . Vertigo    Past Surgical History  Procedure Laterality Date  . Total abdominal hysterectomy w/ bilateral salpingoophorectomy      due to fibroid tumors and uterine bleeding  . Dialtion and curettage    . Colonoscopy  [id number]   Family History  Problem Relation Age of Onset  . Breast cancer Neg Hx   . Colon cancer Neg Hx   . Stroke Neg Hx   . Hyperlipidemia Mother   . Diabetes Mother   . Heart disease Mother    History   Social History  . Marital Status: Married    Spouse Name: N/A    Number of Children: 1  . Years of Education: 15   Occupational History  . retired     Science writer   Social History Main Topics  . Smoking status: Current Every Day Smoker -- 0.50 packs/day  for 30 years    Types: Cigarettes  . Smokeless tobacco: Never Used     Comment: pt states she has smoked for a long time  . Alcohol Use: No  . Drug Use: No  . Sexually Active: Yes -- Female partner(s)   Other Topics Concern  . Not on file   Social History Narrative   HSG, 3 years college. Married 1971. 1 son - '78.  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 doxazosin  (CARDURA) 2 MG tablet TAKE 1 TABLET BY MOUTH EVERY NIGHT AT BEDTIME.  30 tablet  5  . glipiZIDE (GLUCOTROL XL) 10 MG 24 hr tablet TAKE 1 TABLET BY MOUTH DAILY.  30 tablet  5  . hydrochlorothiazide (HYDRODIURIL) 25 MG tablet Take 1 tablet (25 mg total) by mouth daily.  30 tablet  2  . metFORMIN (GLUCOPHAGE) 1000 MG tablet Take 1 tablet (1,000 mg total) by mouth 2 (two) times daily with a meal.  60 tablet  5  . pioglitazone (ACTOS) 30 MG tablet TAKE 1 TABLET BY MOUTH DAILY.  30 tablet  5   Current Facility-Administered Medications on File Prior to Visit  Medication Dose Route Frequency Provider Last Rate Last Dose  . pneumococcal 23 valent vaccine (PNU-IMMUNE) injection 0.5 mL  0.5 mL Intramuscular Once Jacques Navy, MD            Review of Systems Constitutional:  Negative for fever, chills, activity change and unexpected weight change.  HEENT:  Negative for hearing loss, ear pain, congestion, neck stiffness and postnasal drip. Negative for sore throat or swallowing problems. Negative for dental complaints.   Eyes: Negative for vision loss or change in visual acuity.  Respiratory: Negative for chest tightness and wheezing. Negative for DOE.   Cardiovascular: Negative for chest pain or palpitations. No decreased exercise tolerance Gastrointestinal: No change in bowel habit. No bloating or gas. No reflux or indigestion Genitourinary: Negative for urgency, frequency, flank pain and difficulty urinating.  Musculoskeletal: Negative for myalgias, back pain, arthralgias and gait problem.  Neurological: Negative for dizziness, tremors, weakness and headaches.  Hematological: Negative for adenopathy.  Psychiatric/Behavioral: Negative for behavioral problems and dysphoric mood.       Objective:   Physical Exam Filed Vitals:   09/22/12 1019  BP: 138/76  Pulse: 67  Temp: 97.9 F (36.6 C)  Resp: 12   Wt Readings from Last 3 Encounters:  09/22/12 243 lb 12.8 oz (110.587 kg)  07/15/12  241 lb 6.4 oz (109.498 kg)  01/20/12 249 lb (112.946 kg)   Gen'l: well nourished, well developed AA Woman in no distress HEENT - [place]/AT, EACs/TMs normal, oropharynx with upper denture with lower teeth native in good condition, no buccal lesions, posterior pharynx clear, mucous membranes moist. C&S clear, PERRLA. Neck - supple, no thyromegaly Nodes- negative submental, cervical, supraclavicular regions Chest - no deformity, no CVAT Lungs - clear without rales, wheezes. No increased work of breathing Breast - deferred - will schedule mammogram Cardiovascular - regular rate and rhythm, quiet precordium, no murmurs, rubs or gallops, 2+ radial, DP and PT pulses Abdomen - BS+ x 4, no HSM, no guarding or rebound or tenderness Pelvic - deferred due to s/p hysterectomy Rectal - deferred to GI Extremities - no clubbing, cyanosis, edema or deformity.  Neuro - A&O x 3, CN II-XII normal, motor  strength normal and equal, DTRs 2+ and symmetrical biceps, radial, and patellar tendons. Cerebellar - no tremor, no rigidity, fluid movement and normal gait. Normal light touch, pin-prick and deep vibratory sensation. Derm - Head, neck, back, abdomen and extremities without suspicious lesions  Recent Results (from the past 2160 hour(s))  LIPID PANEL     Status: None   Collection Time    07/15/12 12:04 PM      Result Value Range   Cholesterol 155  0 - 200 mg/dL   Comment: ATP III Classification       Desirable:  < 200 mg/dL               Borderline High:  200 - 239 mg/dL          High:  > = 960 mg/dL   Triglycerides 45.4  0.0 - 149.0 mg/dL   Comment: Normal:  <098 mg/dLBorderline High:  150 - 199 mg/dL   HDL 11.91  >47.82 mg/dL   VLDL 95.6  0.0 - 21.3 mg/dL   LDL Cholesterol 86  0 - 99 mg/dL   Total CHOL/HDL Ratio 3     Comment:                Men          Women1/2 Average Risk     3.4          3.3Average Risk          5.0          4.42X Average Risk          9.6          7.13X Average Risk          15.0           11.0                      CBC WITH DIFFERENTIAL     Status: Abnormal   Collection Time    07/15/12 12:04 PM      Result Value Range   WBC 5.8  4.5 - 10.5 K/uL   RBC 4.69  3.87 - 5.11 Mil/uL   Hemoglobin 11.7 (*) 12.0 - 15.0 g/dL   HCT 08.6  57.8 - 46.9 %   MCV 77.0 (*) 78.0 - 100.0 fl   MCHC 32.5  30.0 - 36.0 g/dL   RDW 62.9 (*) 52.8 - 41.3 %   Platelets 227.0  150.0 - 400.0 K/uL   Neutrophils Relative % 59.5  43.0 - 77.0 %   Lymphocytes Relative 31.1  12.0 - 46.0 %   Monocytes Relative 7.5  3.0 - 12.0 %   Eosinophils Relative 1.0  0.0 - 5.0 %   Basophils Relative 0.9  0.0 - 3.0 %   Neutro Abs 3.5  1.4 - 7.7 K/uL   Lymphs Abs 1.8  0.7 - 4.0 K/uL   Monocytes Absolute 0.4  0.1 - 1.0 K/uL   Eosinophils Absolute 0.1  0.0 - 0.7 K/uL   Basophils Absolute 0.1  0.0 - 0.1 K/uL  COMPREHENSIVE METABOLIC PANEL     Status: Abnormal   Collection Time    07/15/12 12:04 PM      Result Value Range   Sodium 138  135 - 145 mEq/L   Potassium 4.1  3.5 - 5.1 mEq/L   Chloride 101  96 - 112 mEq/L   CO2 32  19 - 32 mEq/L   Glucose, Bld 142 (*) 70 -  99 mg/dL   BUN 15  6 - 23 mg/dL   Creatinine, Ser 0.5  0.4 - 1.2 mg/dL   Total Bilirubin 0.6  0.3 - 1.2 mg/dL   Alkaline Phosphatase 51  39 - 117 U/L   AST 12  0 - 37 U/L   ALT 10  0 - 35 U/L   Total Protein 6.9  6.0 - 8.3 g/dL   Albumin 3.4 (*) 3.5 - 5.2 g/dL   Calcium 9.2  8.4 - 09.8 mg/dL   GFR 119.14  >78.29 mL/min  HEPATIC FUNCTION PANEL     Status: Abnormal   Collection Time    07/15/12 12:04 PM      Result Value Range   Total Bilirubin 0.6  0.3 - 1.2 mg/dL   Bilirubin, Direct 0.1  0.0 - 0.3 mg/dL   Alkaline Phosphatase 51  39 - 117 U/L   AST 12  0 - 37 U/L   ALT 10  0 - 35 U/L   Total Protein 6.9  6.0 - 8.3 g/dL   Albumin 3.4 (*) 3.5 - 5.2 g/dL  SEDIMENTATION RATE     Status: Abnormal   Collection Time    07/15/12 12:04 PM      Result Value Range   Sed Rate 29 (*) 0 - 22 mm/hr  HEMOGLOBIN A1C     Status: Abnormal   Collection  Time    07/15/12 12:04 PM      Result Value Range   Hemoglobin A1C 8.1 (*) 4.6 - 6.5 %   Comment: Glycemic Control Guidelines for People with Diabetes:Non Diabetic:  <6%Goal of Therapy: <7%Additional Action Suggested:  >8%   MICROALBUMIN / CREATININE URINE RATIO     Status: None   Collection Time    07/15/12 12:04 PM      Result Value Range   Microalb, Ur 0.9  0.0 - 1.9 mg/dL   Creatinine,U 562.1     Microalb Creat Ratio 0.8  0.0 - 30.0 mg/g  PROTEIN, URINE, 24 HOUR     Status: None   Collection Time    07/21/12  2:03 PM      Result Value Range   Protein, Urine <3     Protein, 24H Urine NOT CALC  50 - 100 mg/day   Comment:                                                                            UNABLE TO CALCULATE mg/day OF URINE PROTEIN DUE TO LOW TOTAL PROTEIN     VALUE OF < 3 mg/dL.  CREATININE CLEARANCE, URINE, 24 HOUR     Status: Abnormal   Collection Time    07/21/12  2:03 PM      Result Value Range   Creatinine 0.63  0.50 - 1.10 mg/dL   Creatinine, Urine 30.8     Creatinine, 24H Ur 1083  700 - 1800 mg/day   Creatinine Clearance 119 (*) 75 - 115 mL/min   Comment: To convert to Grams, divide mg by 1000         Assessment & Plan:  Smoking cessation: Encouraged her to stop smoking - reviewed multi-system effects.

## 2012-09-29 ENCOUNTER — Ambulatory Visit (INDEPENDENT_AMBULATORY_CARE_PROVIDER_SITE_OTHER): Payer: Medicare HMO | Admitting: Internal Medicine

## 2012-09-29 ENCOUNTER — Encounter: Payer: Self-pay | Admitting: Internal Medicine

## 2012-09-29 VITALS — BP 140/82 | HR 80 | Temp 97.1°F | Ht 62.0 in | Wt 243.0 lb

## 2012-09-29 DIAGNOSIS — E119 Type 2 diabetes mellitus without complications: Secondary | ICD-10-CM

## 2012-09-29 MED ORDER — "PEN NEEDLES 5/16"" 31G X 8 MM MISC": 1.0000 [IU] | Freq: Every day | Status: DC

## 2012-09-29 MED ORDER — INSULIN DETEMIR 100 UNIT/ML FLEXPEN: 50.0000 [IU] | PEN_INJECTOR | Freq: Every morning | SUBCUTANEOUS | Status: DC

## 2012-09-29 NOTE — Progress Notes (Signed)
  Subjective:    Patient ID: Cheryl Delgado, female    DOB: 12/17/1945, 67 y.o.   MRN: [id number]  HPI Ms. Murin returns for follow up after starting basal insulin. She is doing well - the injections are not as bad as she thought.  PMH, FamHx and SocHx reviewed for any changes and relevance.  Current Outpatient Prescriptions on File Prior to Visit  Medication Sig Dispense Refill  . doxazosin (CARDURA) 2 MG tablet Take 1 tablet (2 mg total) by mouth at bedtime.  30 tablet  5  . hydrochlorothiazide (HYDRODIURIL) 25 MG tablet Take 1 tablet (25 mg total) by mouth daily.  30 tablet  5  . metFORMIN (GLUCOPHAGE) 1000 MG tablet Take 1 tablet (1,000 mg total) by mouth 2 (two) times daily with a meal.  60 tablet  5   Current Facility-Administered Medications on File Prior to Visit  Medication Dose Route Frequency Provider Last Rate Last Dose  . pneumococcal 23 valent vaccine (PNU-IMMUNE) injection 0.5 mL  0.5 mL Intramuscular Once Jacques Navy, MD          Review of Systems System review is negative for any constitutional, cardiac, pulmonary, GI or neuro symptoms or complaints other than as described in the HPI.     Objective:   Physical Exam Filed Vitals:   09/29/12 1109  BP: 140/82  Pulse: 80  Temp: 97.1 F (36.2 C)   Cor- RRR Pulm - normal respirations       Assessment & Plan:

## 2012-09-29 NOTE — Patient Instructions (Addendum)
I am very PROUD of you for taking the basal insulin. Good Job.  Titration schedule: check blood sugar every morning before you eat. On a three day cycle - if the blood sugar is 150 or higher 2 out of three days or more increase the levemir by 3 units. Continue this tiration until the AM fasting blood sugar if 2 out 3 times under 150 and this will be you final long term dose.  The needles are very sharp and you can use them 2 or 3 times before they will start to dull.  Rx sent in for Pens and Needles.

## 2012-10-01 NOTE — Assessment & Plan Note (Signed)
Tolerating basal insulin injections.  Plan Titration schedule: check blood sugar every morning before you eat. On a three day cycle - if the blood sugar is 150 or higher 2 out of three days or more increase the levemir by 3 units. Continue this tiration until the AM fasting blood sugar if 2 out 3 times under 150 and this will be you final long term dose.  The needles are very sharp and you can use them 2 or 3 times before they will start to dull.

## 2012-10-09 ENCOUNTER — Ambulatory Visit
Admission: RE | Admit: 2012-10-09 | Discharge: 2012-10-09 | Disposition: A | Discharge status: Home / Self Care | Payer: Medicare Other | Source: Ambulatory Visit

## 2012-10-09 ENCOUNTER — Other Ambulatory Visit: Payer: Self-pay | Admitting: Internal Medicine

## 2012-10-09 ENCOUNTER — Ambulatory Visit: Admission: RE | Admit: 2012-10-09 | Payer: Medicare Other | Source: Ambulatory Visit

## 2012-10-09 DIAGNOSIS — Z1239 Encounter for other screening for malignant neoplasm of breast: Secondary | ICD-10-CM

## 2012-10-29 ENCOUNTER — Ambulatory Visit (INDEPENDENT_AMBULATORY_CARE_PROVIDER_SITE_OTHER): Payer: Medicare Other | Admitting: Internal Medicine

## 2012-10-29 ENCOUNTER — Encounter: Payer: Self-pay | Admitting: Internal Medicine

## 2012-10-29 VITALS — BP 138/80 | HR 69 | Temp 98.1°F | Ht 62.0 in | Wt 235.4 lb

## 2012-10-29 DIAGNOSIS — R609 Edema, unspecified: Secondary | ICD-10-CM

## 2012-10-29 DIAGNOSIS — I10 Essential (primary) hypertension: Secondary | ICD-10-CM

## 2012-10-29 DIAGNOSIS — Z8669 Personal history of other diseases of the nervous system and sense organs: Secondary | ICD-10-CM

## 2012-10-29 DIAGNOSIS — E119 Type 2 diabetes mellitus without complications: Secondary | ICD-10-CM

## 2012-10-29 MED ORDER — FUROSEMIDE 40 MG PO TABS: 40.0000 mg | ORAL_TABLET | Freq: Every day | ORAL | Status: DC

## 2012-10-29 NOTE — Patient Instructions (Addendum)
1. Diabetes - you are doing well.  Plan Continue to titrate up the levemir dose - makes slow changes going up 3 units every 3 days as needed to get to a fasting blood sugar of less than 150.   Continue metformin  2. Peripheral edema - still a problem with swelling of the ankles Plan Continue the use of the stockings  Change diuretic: finish the HCTZ and the switch to furosemide 40 mg once a day.   3. Positional vertigo - a common phenomena.  Plan Be sure you are adequately hydrated  As long as the dizziness doesn't last for more than a minute or so we don't have to do any further diagnostic testing.  4. Blood pressure: Vitals - 1 value per visit 10/29/2012 09/29/2012 09/22/2012 07/15/2012 01/20/2012  SYSTOLIC 138 140 409 134 132  DIASTOLIC 80 82 76 78 82   Vitals - 1 value per visit 10/16/2010 07/03/2009  SYSTOLIC 120 118  DIASTOLIC 80 72   Doing OK   1800 Calorie Diet for Diabetes Meal Planning The 1800 calorie diet is designed for eating up to 1800 calories each day. Following this diet and making healthy meal choices can help improve overall health. This diet controls blood sugar (glucose) levels and can also help lower blood pressure and cholesterol. SERVING SIZES Measuring foods and serving sizes helps to make sure you are getting the right amount of food. The list below tells how big or small some common serving sizes are:  1 oz.........4 stacked dice.  3 oz........Marland KitchenDeck of cards.  1 tsp.......Marland KitchenTip of little finger.  1 tbs......Marland KitchenMarland KitchenThumb.  2 tbs.......Marland KitchenGolf ball.   cup......Marland KitchenHalf of a fist.  1 cup.......Marland KitchenA fist. GUIDELINES FOR CHOOSING FOODS The goal of this diet is to eat a variety of foods and limit calories to 1800 each day. This can be done by choosing foods that are low in calories and fat. The diet also suggests eating small amounts of food frequently. Doing this helps control your blood glucose levels so they do not get too high or too low. Each meal or snack may include  a protein food source to help you feel more satisfied and to stabilize your blood glucose. Try to eat about the same amount of food around the same time each day. This includes weekend days, travel days, and days off work. Space your meals about 4 to 5 hours apart and add a snack between them if you wish.  For example, a daily food plan could include breakfast, a morning snack, lunch, dinner, and an evening snack. Healthy meals and snacks include whole grains, vegetables, fruits, lean meats, poultry, fish, and dairy products. As you plan your meals, select a variety of foods. Choose from the bread and starch, vegetable, fruit, dairy, and meat/protein groups. Examples of foods from each group and their suggested serving sizes are listed below. Use measuring cups and spoons to become familiar with what a healthy portion looks like. Bread and Starch Each serving equals 15 grams of carbohydrates.  1 slice bread.   bagel.   cup cold cereal (unsweetened).   cup hot cereal or mashed potatoes.  1 small potato (size of a computer mouse).   cup cooked pasta or rice.   English muffin.  1 cup broth-based soup.  3 cups of popcorn.  4 to 6 whole-wheat crackers.   cup cooked beans, peas, or corn. Vegetable Each serving equals 5 grams of carbohydrates.   cup cooked vegetables.  1 cup raw vegetables.  cup tomato or vegetable juice. Fruit Each serving equals 15 grams of carbohydrates.  1 small apple or orange.  1 cup watermelon or strawberries.   cup applesauce (no sugar added).  2 tbs raisins.   banana.   cup canned fruit, packed in water, its own juice, or sweetened with a sugar substitute.   cup unsweetened fruit juice. Dairy Each serving equals 12 to 15 grams of carbohydrates.  1 cup fat-free milk.  6 oz artificially sweetened yogurt or plain yogurt.  1 cup low-fat buttermilk.  1 cup soy milk.  1 cup almond milk. Meat/Protein  1 large egg.  2 to 3 oz  meat, poultry, or fish.   cup low-fat cottage cheese.  1 tbs peanut butter.  1 oz low-fat cheese.   cup tuna in water.   cup tofu. Fat  1 tsp oil.  1 tsp trans-fat-free margarine.  1 tsp butter.  1 tsp mayonnaise.  2 tbs avocado.  1 tbs salad dressing.  1 tbs cream cheese.  2 tbs sour cream. SAMPLE 1800 CALORIE DIET PLAN Breakfast   cup unsweetened cereal (1 carb serving).  1 cup fat-free milk (1 carb serving).  1 slice whole-wheat toast (1 carb serving).   small banana (1 carb serving).  1 scrambled egg.  1 tsp trans-fat-free margarine. Lunch  Tuna sandwich.  2 slices whole-wheat bread (2 carb servings).   cup canned tuna in water, drained.  1 tbs reduced fat mayonnaise.  1 stalk celery, chopped.  2 slices tomato.  1 lettuce leaf.  1 cup carrot sticks.  24 to 30 seedless grapes (2 carb servings).  6 oz light yogurt (1 carb serving). Afternoon Snack  3 graham cracker squares (1 carb serving).  Fat-free milk, 1 cup (1 carb serving).  1 tbs peanut butter. Dinner  3 oz salmon, broiled with 1 tsp oil.  1 cup mashed potatoes (2 carb servings) with 1 tsp trans-fat-free margarine.  1 cup fresh or frozen green beans.  1 cup steamed asparagus.  1 cup fat-free milk (1 carb serving). Evening Snack  3 cups air-popped popcorn (1 carb serving).  2 tbs parmesan cheese sprinkled on top. MEAL PLAN Use this worksheet to help you make a daily meal plan based on the 1800 calorie diet suggestions. If you are using this plan to help you control your blood glucose, you may interchange carbohydrate-containing foods (dairy, starches, and fruits). Select a variety of fresh foods of varying colors and flavors. The total amount of carbohydrate in your meals or snacks is more important than making sure you include all of the food groups every time you eat. Choose from the following foods to build your day's meals:  8 Starches.  4 Vegetables.  3  Fruits.  2 Dairy.  6 to 7 oz Meat/Protein.  Up to 4 Fats. Your dietician can use this worksheet to help you decide how many servings and which types of foods are right for you. BREAKFAST Food Group and Servings / Food Choice Starch ________________________________________________________ Dairy _________________________________________________________ Fruit _________________________________________________________ Meat/Protein __________________________________________________ Fat ___________________________________________________________ LUNCH Food Group and Servings / Food Choice Starch ________________________________________________________ Meat/Protein __________________________________________________ Vegetable _____________________________________________________ Fruit _________________________________________________________ Dairy _________________________________________________________ Fat ___________________________________________________________ Cheryl Delgado Food Group and Servings / Food Choice Starch ________________________________________________________ Meat/Protein __________________________________________________ Fruit __________________________________________________________ Dairy _________________________________________________________ Cheryl Delgado Food Group and Servings / Food Choice Starch _________________________________________________________ Meat/Protein ___________________________________________________ Dairy __________________________________________________________ Vegetable ______________________________________________________ Fruit ___________________________________________________________ Fat ____________________________________________________________ Cheryl Delgado Food Group and Servings / Food Choice Fruit __________________________________________________________ Meat/Protein ___________________________________________________ Dairy  __________________________________________________________ Starch _________________________________________________________  DAILY TOTALS Starch ____________________________ Vegetable _________________________ Fruit _____________________________ Dairy _____________________________ Meat/Protein______________________ Fat _______________________________ Document Released: 10/22/2004 Document Revised: 06/24/2011 Document Reviewed: 02/15/2011 [employer] Patient Information 2014 [place], [place].   Benign Positional Vertigo Vertigo means you feel like you or your surroundings are moving when they are not. Benign positional vertigo is the most common form of vertigo. Benign means that the cause of your condition is not serious. Benign positional vertigo is more common in older adults. CAUSES  Benign positional vertigo is the result of an upset in the labyrinth system. This is an area in the middle ear that helps control your balance. This may be caused by a viral infection, head injury, or repetitive motion. However, often no specific cause is found. SYMPTOMS  Symptoms of benign positional vertigo occur when you move your head or eyes in different directions. Some of the symptoms may include:  Loss of balance and falls.  Vomiting.  Blurred vision.  Dizziness.  Nausea.  Involuntary eye movements (nystagmus). DIAGNOSIS  Benign positional vertigo is usually diagnosed by physical exam. If the specific cause of your benign positional vertigo is unknown, your caregiver may perform imaging tests, such as magnetic resonance imaging (MRI) or computed tomography (CT). TREATMENT  Your caregiver may recommend movements or procedures to correct the benign positional vertigo. Medicines such as meclizine, benzodiazepines, and medicines for nausea may be used to treat your symptoms. In rare cases, if your symptoms are caused by certain conditions that affect the inner ear, you may need surgery. HOME CARE  INSTRUCTIONS   Follow your caregiver's instructions.  Move slowly. Do not make sudden body or head movements.  Avoid driving.  Avoid operating heavy machinery.  Avoid performing any tasks that would be dangerous to you or others during a vertigo episode.  Drink enough fluids to keep your urine clear or pale yellow. SEEK IMMEDIATE MEDICAL CARE IF:   You develop problems with walking, weakness, numbness, or using your arms, hands, or legs.  You have difficulty speaking.  You develop severe headaches.  Your nausea or vomiting continues or gets worse.  You develop visual changes.  Your family or friends notice any behavioral changes.  Your condition gets worse.  You have a fever.  You develop a stiff neck or sensitivity to light. MAKE SURE YOU:   Understand these instructions.  Will watch your condition.  Will get help right away if you are not doing well or get worse. Document Released: 01/07/2006 Document Revised: 06/24/2011 Document Reviewed: 12/20/2010 [facility] Patient Information 2014 [place], [place].

## 2012-10-29 NOTE — Assessment & Plan Note (Signed)
Diabetes - you are doing well.  Plan Continue to titrate up the levemir dose - makes slow changes going up 3 units every 3 days as needed to get to a fasting blood sugar of less than 150.   Continue metformin

## 2012-10-29 NOTE — Assessment & Plan Note (Signed)
BP Readings from Last 3 Encounters:  10/29/12 138/80  09/29/12 140/82  09/22/12 138/76   Good control. Will be better on furosemide instead of HCT

## 2012-10-29 NOTE — Assessment & Plan Note (Signed)
Peripheral edema - still a problem with swelling of the ankles Plan Continue the use of the stockings  Change diuretic: finish the HCTZ and the switch to furosemide 40 mg once a day

## 2012-10-29 NOTE — Assessment & Plan Note (Signed)
Positional vertigo - a common phenomena.  Plan Be sure you are adequately hydrated  As long as the dizziness doesn't last for more than a minute or so we don't have to do any further diagnostic testing.

## 2012-10-29 NOTE — Progress Notes (Signed)
  Subjective:    Patient ID: Cheryl Delgado, female    DOB: Nov 25, 1945, 67 y.o.   MRN: [id number]  HPI Follow up for insulin dependent diabetes. She is doing ok with shots. Her fasting CBG is still running in the 200 range. She has been asymptomatic.  She is still having peripheral edema despite the use of stockings and diuretic.  PMH, FamHx and SocHx reviewed for any changes and relevance. Current Outpatient Prescriptions on File Prior to Visit  Medication Sig Dispense Refill  . doxazosin (CARDURA) 2 MG tablet Take 1 tablet (2 mg total) by mouth at bedtime.  30 tablet  5  . hydrochlorothiazide (HYDRODIURIL) 25 MG tablet Take 1 tablet (25 mg total) by mouth daily.  30 tablet  5  . Insulin Detemir (LEVEMIR FLEXPEN) 100 UNIT/ML SOPN Inject 50 Units into the skin every morning.  5 pen  11  . Insulin Pen Needle (PEN NEEDLES 31GX5/16") 31G X 8 MM MISC 1 Units by Does not apply route daily.  30 each  11  . metFORMIN (GLUCOPHAGE) 1000 MG tablet Take 1 tablet (1,000 mg total) by mouth 2 (two) times daily with a meal.  60 tablet  5   Current Facility-Administered Medications on File Prior to Visit  Medication Dose Route Frequency Provider Last Rate Last Dose  . pneumococcal 23 valent vaccine (PNU-IMMUNE) injection 0.5 mL  0.5 mL Intramuscular Once Jacques Navy, MD          Review of Systems System review is negative for any constitutional, cardiac, pulmonary, GI or neuro symptoms or complaints other than as described in the HPI.     Objective:   Physical Exam Filed Vitals:   10/29/12 1041  BP: 138/80  Pulse: 69  Temp: 98.1 F (36.7 C)   Wt Readings from Last 3 Encounters:  10/29/12 235 lb 6.4 oz (106.777 kg)  09/29/12 243 lb (110.224 kg)  09/22/12 243 lb 12.8 oz (110.587 kg)   Gen'l- overweight AA woman in no distress Cor- RRR, ext - 2+ pedal edema Pulm - normal respirations. Neuro - A&O x 3, normal gait, normal strength       Assessment & Plan:

## 2012-11-18 ENCOUNTER — Other Ambulatory Visit: Payer: Self-pay

## 2013-01-20 ENCOUNTER — Other Ambulatory Visit: Payer: Self-pay | Admitting: Internal Medicine

## 2013-02-18 ENCOUNTER — Other Ambulatory Visit: Payer: Self-pay

## 2013-07-01 ENCOUNTER — Other Ambulatory Visit: Payer: Self-pay | Admitting: Internal Medicine

## 2013-08-03 ENCOUNTER — Other Ambulatory Visit: Payer: Self-pay | Admitting: *Deleted

## 2013-08-03 MED ORDER — METFORMIN HCL 1000 MG PO TABS: ORAL_TABLET | ORAL | Status: DC

## 2013-08-17 LAB — HM DIABETES EYE EXAM

## 2013-10-06 ENCOUNTER — Other Ambulatory Visit: Payer: Self-pay

## 2013-10-06 MED ORDER — DOXAZOSIN MESYLATE 2 MG PO TABS: 2.0000 mg | ORAL_TABLET | Freq: Every day | ORAL | Status: DC

## 2014-01-01 ENCOUNTER — Telehealth: Payer: Self-pay

## 2014-01-01 NOTE — Telephone Encounter (Signed)
LVM for pt to call back to schedule AWV

## 2014-01-06 ENCOUNTER — Encounter: Payer: Self-pay | Admitting: Internal Medicine

## 2014-01-06 ENCOUNTER — Ambulatory Visit (INDEPENDENT_AMBULATORY_CARE_PROVIDER_SITE_OTHER): Payer: Medicare HMO | Admitting: Internal Medicine

## 2014-01-06 VITALS — BP 142/84 | HR 89 | Temp 99.1°F | Ht 62.0 in | Wt 225.1 lb

## 2014-01-06 DIAGNOSIS — Z23 Encounter for immunization: Secondary | ICD-10-CM

## 2014-01-06 DIAGNOSIS — I10 Essential (primary) hypertension: Secondary | ICD-10-CM

## 2014-01-06 DIAGNOSIS — R609 Edema, unspecified: Secondary | ICD-10-CM

## 2014-01-06 DIAGNOSIS — E119 Type 2 diabetes mellitus without complications: Secondary | ICD-10-CM

## 2014-01-06 DIAGNOSIS — Z0000 Encounter for general adult medical examination without abnormal findings: Secondary | ICD-10-CM

## 2014-01-06 MED ORDER — DOXAZOSIN MESYLATE 2 MG PO TABS: 2.0000 mg | ORAL_TABLET | Freq: Every day | ORAL | Status: DC

## 2014-01-06 MED ORDER — METFORMIN HCL 1000 MG PO TABS: ORAL_TABLET | ORAL | Status: DC

## 2014-01-06 NOTE — Progress Notes (Signed)
Pre visit review using our clinic review tool, if applicable. No additional management support is needed unless otherwise documented below in the visit note. 

## 2014-01-06 NOTE — Progress Notes (Signed)
Subjective:    Patient ID: Cheryl Delgado, female    DOB: [birth year], 68 y.o.   MRN: [id number]  HPI  Here to f/u; overall doing ok,  Pt denies chest pain, increased sob or doe, wheezing, orthopnea, PND, increased LE swelling, palpitations, dizziness or syncope.  Pt denies polydipsia, polyuria, or low sugar symptoms such as weakness or confusion improved with po intake.  Pt denies new neurological symptoms such as new headache, or facial or extremity weakness or numbness.   Pt states overall good compliance with meds, has been trying to follow lower cholesterol, diabetic diet, with wt overall stable,  but little exercise however. Out of BP meds for 5 days  Increased fluid pills did not help swelling. Has appt with new PCP oct 16 Past Medical History  Diagnosis Date  . Hypertension   . Type II or unspecified type diabetes mellitus without mention of complication, not stated as uncontrolled   . Edema     peripheral  . Chest pain, unspecified   . Vertigo    Past Surgical History  Procedure Laterality Date  . Total abdominal hysterectomy w/ bilateral salpingoophorectomy      due to fibroid tumors and uterine bleeding  . Dialtion and curettage    . Colonoscopy  [id number]    reports that she has been smoking Cigarettes.  She has a 15 pack-year smoking history. She has never used smokeless tobacco. She reports that she does not drink alcohol or use illicit drugs. family history includes Diabetes in her mother; Heart disease in her mother; Hyperlipidemia in her mother. There is no history of Breast cancer, Colon cancer, or Stroke. No Known Allergies Current Outpatient Prescriptions on File Prior to Visit  Medication Sig Dispense Refill  . furosemide (LASIX) 40 MG tablet TAKE 1 TABLET BY MOUTH DAILY  30 tablet  5  . Insulin Detemir (LEVEMIR FLEXPEN) 100 UNIT/ML SOPN Inject 50 Units into the skin every morning.  5 pen  11  . Insulin Pen Needle (PEN NEEDLES 31GX5/16") 31G X 8 MM MISC 1 Units by Does  not apply route daily.  30 each  11  . ONETOUCH DELICA LANCETS 01S MISC CHECK BLOOD SUGARS TWICE DAILY  100 each  0  . ONETOUCH VERIO test strip TEST BLOOD SUGARS TWICE DAILY  100 each  0   Current Facility-Administered Medications on File Prior to Visit  Medication Dose Route Frequency Provider Last Rate Last Dose  . pneumococcal 23 valent vaccine (PNU-IMMUNE) injection 0.5 mL  0.5 mL Intramuscular Once Neena Rhymes, MD        Review of Systems  Constitutional: Negative for unusual diaphoresis or other sweats  HENT: Negative for ringing in ear Eyes: Negative for double vision or worsening visual disturbance.  Respiratory: Negative for choking and stridor.   Gastrointestinal: Negative for vomiting or other signifcant bowel change Genitourinary: Negative for hematuria or decreased urine volume.  Musculoskeletal: Negative for other MSK pain or swelling Skin: Negative for color change and worsening wound.  Neurological: Negative for tremors and numbness other than noted  Psychiatric/Behavioral: Negative for decreased concentration or agitation other than above       Objective:   Physical Exam BP 142/84  Pulse 89  Temp(Src) 99.1 F (37.3 C) (Oral)  Ht 5\' 2"  (1.575 m)  Wt 225 lb 2 oz (102.116 kg)  BMI 41.17 kg/m2  SpO2 95% VS noted,  Constitutional: Pt appears well-developed, well-nourished.  HENT: Head: NCAT.  Right Ear: External ear normal.  Left Ear: External ear normal.  Eyes: . Pupils are equal, round, and reactive to light. Conjunctivae and EOM are normal Neck: Normal range of motion. Neck supple.  Cardiovascular: Normal rate and regular rhythm.   Pulmonary/Chest: Effort normal and breath sounds normal.  Abd:  Soft, NT, ND, + BS Neurological: Pt is alert. Not confused , motor grossly intact Skin: Skin is warm. 1+ LE edema to knees bilat, mult varicose veins Psychiatric: Pt behavior is normal. No agitation.     Assessment & Plan:

## 2014-01-06 NOTE — Patient Instructions (Addendum)
You had the flu shot today  You should plan to have the new Prevnar pneumonia shot on oct 16 with seeing Dr Radonna Ricker are given the prescription for the compression stockings, to only wear during the day  Please continue all other medications as before, and refills have been done if requested.  Please have the pharmacy call with any other refills you may need.  Please keep your appointments with your specialists as you may have planned  Please go to the LAB in the Basement (turn left off the elevator) for the tests to be done a few days before your next appt with your new PCP - Dr Doug Sou - Oct 16

## 2014-01-07 NOTE — Assessment & Plan Note (Signed)
stable overall by history and exam, recent data reviewed with pt, and pt to continue medical treatment as before,  to f/u any worsening symptoms or concerns Lab Results  Component Value Date   HGBA1C 8.1* 07/15/2012   For f/u labs

## 2014-01-07 NOTE — Assessment & Plan Note (Signed)
stable overall by history and exam, recent data reviewed with pt, and pt to continue medical treatment as before,  to f/u any worsening symptoms or concerns BP Readings from Last 3 Encounters:  01/06/14 142/84  10/29/12 138/80  09/29/12 140/82

## 2014-01-07 NOTE — Assessment & Plan Note (Signed)
C/w venous insuff - for compression stockings, low salt diet, leg elevation, wt loss

## 2014-01-26 ENCOUNTER — Other Ambulatory Visit (INDEPENDENT_AMBULATORY_CARE_PROVIDER_SITE_OTHER): Payer: Medicare HMO

## 2014-01-26 DIAGNOSIS — E119 Type 2 diabetes mellitus without complications: Secondary | ICD-10-CM

## 2014-01-26 DIAGNOSIS — Z0000 Encounter for general adult medical examination without abnormal findings: Secondary | ICD-10-CM

## 2014-01-26 LAB — URINALYSIS, ROUTINE W REFLEX MICROSCOPIC
Bilirubin Urine: NEGATIVE
HGB URINE DIPSTICK: NEGATIVE
KETONES UR: NEGATIVE
Leukocytes, UA: NEGATIVE
Nitrite: NEGATIVE
RBC / HPF: NONE SEEN (ref 0–?)
Specific Gravity, Urine: 1.015 (ref 1.000–1.030)
Total Protein, Urine: NEGATIVE
URINE GLUCOSE: NEGATIVE
UROBILINOGEN UA: 1 (ref 0.0–1.0)
pH: 6.5 (ref 5.0–8.0)

## 2014-01-26 LAB — HEPATIC FUNCTION PANEL
ALK PHOS: 63 U/L (ref 39–117)
ALT: 11 U/L (ref 0–35)
AST: 14 U/L (ref 0–37)
Albumin: 3.2 g/dL — ABNORMAL LOW (ref 3.5–5.2)
BILIRUBIN DIRECT: 0.1 mg/dL (ref 0.0–0.3)
TOTAL PROTEIN: 7.1 g/dL (ref 6.0–8.3)
Total Bilirubin: 0.7 mg/dL (ref 0.2–1.2)

## 2014-01-26 LAB — CBC WITH DIFFERENTIAL/PLATELET
BASOS ABS: 0 10*3/uL (ref 0.0–0.1)
Basophils Relative: 0.6 % (ref 0.0–3.0)
EOS ABS: 0.2 10*3/uL (ref 0.0–0.7)
Eosinophils Relative: 2.8 % (ref 0.0–5.0)
HEMATOCRIT: 39.4 % (ref 36.0–46.0)
HEMOGLOBIN: 12.5 g/dL (ref 12.0–15.0)
LYMPHS ABS: 2.6 10*3/uL (ref 0.7–4.0)
LYMPHS PCT: 38.6 % (ref 12.0–46.0)
MCHC: 31.7 g/dL (ref 30.0–36.0)
MCV: 78.4 fl (ref 78.0–100.0)
Monocytes Absolute: 0.5 10*3/uL (ref 0.1–1.0)
Monocytes Relative: 7.6 % (ref 3.0–12.0)
NEUTROS ABS: 3.4 10*3/uL (ref 1.4–7.7)
Neutrophils Relative %: 50.4 % (ref 43.0–77.0)
Platelets: 224 10*3/uL (ref 150.0–400.0)
RBC: 5.03 Mil/uL (ref 3.87–5.11)
RDW: 17 % — AB (ref 11.5–15.5)
WBC: 6.7 10*3/uL (ref 4.0–10.5)

## 2014-01-26 LAB — BASIC METABOLIC PANEL
BUN: 12 mg/dL (ref 6–23)
CALCIUM: 9.5 mg/dL (ref 8.4–10.5)
CO2: 35 meq/L — AB (ref 19–32)
Chloride: 103 mEq/L (ref 96–112)
Creatinine, Ser: 0.6 mg/dL (ref 0.4–1.2)
GFR: 133.15 mL/min (ref >60.00)
Glucose, Bld: 85 mg/dL (ref 70–99)
Potassium: 4.1 mEq/L (ref 3.5–5.1)
SODIUM: 141 meq/L (ref 135–145)

## 2014-01-26 LAB — LIPID PANEL
Cholesterol: 141 mg/dL (ref 0–200)
HDL: 42.6 mg/dL (ref >39.00)
LDL Cholesterol: 84 mg/dL (ref 0–99)
NONHDL: 98.4
Total CHOL/HDL Ratio: 3
Triglycerides: 74 mg/dL (ref 0.0–149.0)
VLDL: 14.8 mg/dL (ref 0.0–40.0)

## 2014-01-26 LAB — MICROALBUMIN / CREATININE URINE RATIO
CREATININE, U: 186.6 mg/dL
MICROALB UR: 1 mg/dL (ref 0.0–1.9)
MICROALB/CREAT RATIO: 0.5 mg/g (ref 0.0–30.0)

## 2014-01-26 LAB — HEMOGLOBIN A1C: HEMOGLOBIN A1C: 7.7 % — AB (ref 4.6–6.5)

## 2014-01-26 LAB — TSH: TSH: 4.14 u[IU]/mL (ref 0.35–4.50)

## 2014-01-28 ENCOUNTER — Ambulatory Visit (INDEPENDENT_AMBULATORY_CARE_PROVIDER_SITE_OTHER): Payer: Medicare HMO | Admitting: Internal Medicine

## 2014-01-28 ENCOUNTER — Encounter: Payer: Self-pay | Admitting: Internal Medicine

## 2014-01-28 VITALS — BP 124/72 | HR 73 | Temp 98.2°F | Resp 14 | Ht 62.0 in | Wt 224.8 lb

## 2014-01-28 DIAGNOSIS — E669 Obesity, unspecified: Secondary | ICD-10-CM

## 2014-01-28 DIAGNOSIS — Z0000 Encounter for general adult medical examination without abnormal findings: Secondary | ICD-10-CM

## 2014-01-28 DIAGNOSIS — I872 Venous insufficiency (chronic) (peripheral): Secondary | ICD-10-CM

## 2014-01-28 DIAGNOSIS — Z418 Encounter for other procedures for purposes other than remedying health state: Secondary | ICD-10-CM

## 2014-01-28 DIAGNOSIS — F17210 Nicotine dependence, cigarettes, uncomplicated: Secondary | ICD-10-CM

## 2014-01-28 DIAGNOSIS — I10 Essential (primary) hypertension: Secondary | ICD-10-CM

## 2014-01-28 DIAGNOSIS — Z299 Encounter for prophylactic measures, unspecified: Secondary | ICD-10-CM

## 2014-01-28 DIAGNOSIS — E1169 Type 2 diabetes mellitus with other specified complication: Secondary | ICD-10-CM

## 2014-01-28 DIAGNOSIS — E119 Type 2 diabetes mellitus without complications: Secondary | ICD-10-CM

## 2014-01-28 MED ORDER — GABAPENTIN 100 MG PO CAPS: 100.0000 mg | ORAL_CAPSULE | Freq: Two times a day (BID) | ORAL | Status: DC | PRN

## 2014-01-28 MED ORDER — FUROSEMIDE 40 MG PO TABS: 40.0000 mg | ORAL_TABLET | Freq: Every day | ORAL | Status: DC

## 2014-01-28 MED ORDER — PNEUMOCOCCAL 13-VAL CONJ VACC IM SUSP: 0.5000 mL | INTRAMUSCULAR | Status: DC

## 2014-01-28 NOTE — Assessment & Plan Note (Addendum)
She is currently lost 10 pounds since last year and advised her she needs to continue to work on weight loss and increasing her level of exercise.

## 2014-01-28 NOTE — Patient Instructions (Addendum)
We think that the nerve that runs along the thigh is likely getting compressed and causing you to have pain when you are sitting for long periods of time.   The first step to treatment is working on wearing loose fitting garments that do not squeeze your thighs and to try to lose some weight to help with the pressure on the nerve.   If you think that we need to try a medicine for it the first option is a nerve medicine called gabapentin. If you want to try that we will have you start with 1 pill daily for the first 4 days, then you can take 1 pill up to 2 times a day as needed for pain. This medicine can make you sleepy and you should not attempt to drive until you know how it will affect you.  For the diabetes we will give you some information about a new medicine to replace the levemir that may help you to lose weight more easily. Look over the information and call us before your next appointment, if you want to try it we will send in the prescription and we will have you give the first dose during our visit to make sure we can help show you how to use it.   Exercise to Lose Weight Exercise and a healthy diet may help you lose weight. Your doctor may suggest specific exercises. EXERCISE IDEAS AND TIPS  Choose low-cost things you enjoy doing, such as walking, bicycling, or exercising to workout videos.  Take stairs instead of the elevator.  Walk during your lunch break.  Park your car further away from work or school.  Go to a gym or an exercise class.  Start with 5 to 10 minutes of exercise each day. Build up to 30 minutes of exercise 4 to 6 days a week.  Wear shoes with good support and comfortable clothes.  Stretch before and after working out.  Work out until you breathe harder and your heart beats faster.  Drink extra water when you exercise.  Do not do so much that you hurt yourself, feel dizzy, or get very short of breath. Exercises that burn about 150 calories:  Running 1   miles in 15 minutes.  Playing volleyball for 45 to 60 minutes.  Washing and waxing a car for 45 to 60 minutes.  Playing touch football for 45 minutes.  Walking 1  miles in 35 minutes.  Pushing a stroller 1  miles in 30 minutes.  Playing basketball for 30 minutes.  Raking leaves for 30 minutes.  Bicycling 5 miles in 30 minutes.  Walking 2 miles in 30 minutes.  Dancing for 30 minutes.  Shoveling snow for 15 minutes.  Swimming laps for 20 minutes.  Walking up stairs for 15 minutes.  Bicycling 4 miles in 15 minutes.  Gardening for 30 to 45 minutes.  Jumping rope for 15 minutes.  Washing windows or floors for 45 to 60 minutes. Document Released: 05/04/2010 Document Revised: 06/24/2011 Document Reviewed: 05/04/2010 [facility] Patient Information 2015 [place], [place]. This information is not intended to replace advice given to you by your health care provider. Make sure you discuss any questions you have with your health care provider.

## 2014-01-28 NOTE — Progress Notes (Signed)
   Subjective:    Patient ID: Cheryl Delgado, female    DOB: [birth year], 68 y.o.   MRN: [id number]  HPI The patient is a 68 year old female who comes in today for establish care. She has past medical history of tobacco abuse, venous insufficiency, diabetes mellitus type 2, hypertension. She has been having some leg pains on the lateral aspect of the thigh which are worse when she is sitting and has pressure against leg.she has been taking Levemir metformin for her diabetes and denies any hyperglycemia, she does have occasional episodes where she is shaky although her meter is not currently working. She does take some food and this resolves the issue. She denies chest pains, shortness of breath, abdominal pain, constipation, diarrhea, blood in stool. She is not exercising less recently although she does play cards with her friends. Since retirement her physical activity level has decreased significantly.  Review of Systems  Constitutional: Negative for fever, activity change, appetite change, fatigue and unexpected weight change.  HENT: Negative.   Eyes: Negative.   Respiratory: Negative for cough, chest tightness, shortness of breath and wheezing.   Cardiovascular: Positive for leg swelling. Negative for chest pain and palpitations.  Gastrointestinal: Negative for abdominal pain, diarrhea, constipation and abdominal distention.  Musculoskeletal: Positive for myalgias.  Skin: Negative.   Neurological: Positive for dizziness. Negative for weakness, light-headedness and headaches.       Some dizziness at night time when she lies down in bed.      Objective:   Physical Exam  Constitutional: She is oriented to person, place, and time. She appears well-developed and well-nourished.  Obese  HENT:  Head: Normocephalic and atraumatic.  Eyes: EOM are normal.  Neck: Normal range of motion.  Cardiovascular: Normal rate and regular rhythm.   No murmur heard. Pulmonary/Chest: Effort normal and breath  sounds normal. No respiratory distress. She has no wheezes. She has no rales.  Abdominal: Soft. Bowel sounds are normal. She exhibits no distension. There is no tenderness. There is no rebound.  Musculoskeletal: She exhibits tenderness.  Some tenderness along the lateral aspect of the thigh which radiates below the knee. Sensitive to compression of the lateral femoral nerve.  Neurological: She is alert and oriented to person, place, and time. Coordination normal.   Filed Vitals:   01/28/14 0935  BP: 124/72  Pulse: 73  Temp: 98.2 F (36.8 C)  TempSrc: Oral  Resp: 14  Height: 5\' 2"  (1.575 m)  Weight: 224 lb 12.8 oz (101.969 kg)  SpO2: 96%      Assessment & Plan:

## 2014-01-28 NOTE — Assessment & Plan Note (Signed)
Already had flu shot, given Prevnar 13 at today's visit. Reminded she is overdue for eye exam.

## 2014-01-28 NOTE — Assessment & Plan Note (Signed)
Continue metformin 1000 twice a day, Levemir advise decrease to 40 units. Last hemoglobin A1c of 7.7. Talk to her about Glp-1 agonist that she could take weekly to replace or work with Levemir. She does not like the injections. Spoke with her also about modifying her diet and increasing her level of activity. She is overdue for eye exam and reminded her about that. Exam done today. No signs of diabetic nephropathy on recent micron to creatinine ratio. She's not currently on ACE inhibitor and will discuss change at next visit.

## 2014-01-28 NOTE — Progress Notes (Signed)
Pre visit review using our clinic review tool, if applicable. No additional management support is needed unless otherwise documented below in the visit note. 

## 2014-01-28 NOTE — Assessment & Plan Note (Signed)
She continues to smoke and does not wish to quit at this time although I have advised her about the adverse effects on her health.

## 2014-01-28 NOTE — Assessment & Plan Note (Signed)
Doxazosin and Lasix. This is not an ideal regimen given her concomitant diabetes. She does not wish to make any switches at today's visit however we'll think about it at next visit we may try to start ACE inhibitor and stopped one of her current medications.

## 2014-01-28 NOTE — Assessment & Plan Note (Signed)
No kidney impairment, she does take Lasix 40 mg daily and her legs are recumbent most of the time. She was given prescription for compression stockings which he has been unable to fill yet.

## 2014-03-17 ENCOUNTER — Ambulatory Visit: Payer: Medicare HMO | Admitting: Internal Medicine

## 2014-04-06 ENCOUNTER — Telehealth: Payer: Self-pay | Admitting: Internal Medicine

## 2014-04-06 ENCOUNTER — Other Ambulatory Visit: Payer: Self-pay | Admitting: Geriatric Medicine

## 2014-04-06 MED ORDER — INSULIN DETEMIR 100 UNIT/ML FLEXPEN: 50.0000 [IU] | PEN_INJECTOR | Freq: Every morning | SUBCUTANEOUS | Status: DC

## 2014-04-06 NOTE — Telephone Encounter (Signed)
Sent to pharmacy 

## 2014-04-06 NOTE — Telephone Encounter (Signed)
[facility]/[address] told pt they have been trying to get Levamir prescription filled with office for 3 days with no response. Pt is out of this and needs for her diabetes.  [phone]---pt's #

## 2015-01-12 ENCOUNTER — Other Ambulatory Visit: Payer: Self-pay | Admitting: Internal Medicine

## 2015-01-13 ENCOUNTER — Other Ambulatory Visit: Payer: Self-pay | Admitting: Internal Medicine

## 2015-02-16 ENCOUNTER — Other Ambulatory Visit: Payer: Self-pay | Admitting: Internal Medicine

## 2015-03-02 ENCOUNTER — Telehealth: Payer: Self-pay

## 2015-03-02 NOTE — Telephone Encounter (Signed)
LVM for pt to call back in regards to scheduling AWV with our health coach.   RE: please schedule visit with pt for 2016

## 2015-04-19 ENCOUNTER — Other Ambulatory Visit: Payer: Self-pay | Admitting: Internal Medicine

## 2015-05-25 ENCOUNTER — Other Ambulatory Visit: Payer: Self-pay | Admitting: Internal Medicine

## 2015-07-21 ENCOUNTER — Other Ambulatory Visit: Payer: Self-pay | Admitting: Internal Medicine

## 2015-08-16 ENCOUNTER — Other Ambulatory Visit: Payer: Self-pay | Admitting: Internal Medicine

## 2015-08-17 ENCOUNTER — Other Ambulatory Visit: Payer: Self-pay | Admitting: Internal Medicine

## 2015-08-29 ENCOUNTER — Other Ambulatory Visit: Payer: Self-pay | Admitting: Internal Medicine

## 2015-10-16 ENCOUNTER — Other Ambulatory Visit: Payer: Self-pay | Admitting: Internal Medicine

## 2015-10-27 ENCOUNTER — Encounter: Payer: Self-pay | Admitting: Internal Medicine

## 2015-10-27 ENCOUNTER — Ambulatory Visit (INDEPENDENT_AMBULATORY_CARE_PROVIDER_SITE_OTHER): Payer: Medicare HMO | Admitting: Internal Medicine

## 2015-10-27 ENCOUNTER — Other Ambulatory Visit (INDEPENDENT_AMBULATORY_CARE_PROVIDER_SITE_OTHER): Payer: Medicare HMO

## 2015-10-27 VITALS — BP 142/90 | HR 68 | Temp 98.9°F | Ht 62.0 in | Wt 207.8 lb

## 2015-10-27 DIAGNOSIS — R69 Illness, unspecified: Secondary | ICD-10-CM | POA: Diagnosis not present

## 2015-10-27 DIAGNOSIS — F17210 Nicotine dependence, cigarettes, uncomplicated: Secondary | ICD-10-CM | POA: Diagnosis not present

## 2015-10-27 DIAGNOSIS — IMO0001 Reserved for inherently not codable concepts without codable children: Secondary | ICD-10-CM

## 2015-10-27 DIAGNOSIS — E1165 Type 2 diabetes mellitus with hyperglycemia: Secondary | ICD-10-CM

## 2015-10-27 DIAGNOSIS — E1169 Type 2 diabetes mellitus with other specified complication: Secondary | ICD-10-CM

## 2015-10-27 DIAGNOSIS — E119 Type 2 diabetes mellitus without complications: Secondary | ICD-10-CM

## 2015-10-27 DIAGNOSIS — E669 Obesity, unspecified: Secondary | ICD-10-CM | POA: Diagnosis not present

## 2015-10-27 DIAGNOSIS — I10 Essential (primary) hypertension: Secondary | ICD-10-CM | POA: Diagnosis not present

## 2015-10-27 LAB — LIPID PANEL
CHOL/HDL RATIO: 3
Cholesterol: 145 mg/dL (ref 0–200)
HDL: 54.5 mg/dL (ref >39.00)
LDL Cholesterol: 69 mg/dL (ref 0–99)
NONHDL: 90.44
Triglycerides: 108 mg/dL (ref 0.0–149.0)
VLDL: 21.6 mg/dL (ref 0.0–40.0)

## 2015-10-27 LAB — COMPREHENSIVE METABOLIC PANEL
ALBUMIN: 4 g/dL (ref 3.5–5.2)
ALK PHOS: 64 U/L (ref 39–117)
ALT: 8 U/L (ref 0–35)
AST: 10 U/L (ref 0–37)
BUN: 13 mg/dL (ref 6–23)
CO2: 31 mEq/L (ref 19–32)
CREATININE: 0.64 mg/dL (ref 0.40–1.20)
Calcium: 9.9 mg/dL (ref 8.4–10.5)
Chloride: 101 mEq/L (ref 96–112)
GFR: 118.24 mL/min (ref >60.00)
GLUCOSE: 289 mg/dL — AB (ref 70–99)
POTASSIUM: 4.2 meq/L (ref 3.5–5.1)
SODIUM: 140 meq/L (ref 135–145)
TOTAL PROTEIN: 7.2 g/dL (ref 6.0–8.3)
Total Bilirubin: 0.5 mg/dL (ref 0.2–1.2)

## 2015-10-27 LAB — HEMOGLOBIN A1C

## 2015-10-27 LAB — MICROALBUMIN / CREATININE URINE RATIO
CREATININE, U: 70.8 mg/dL
MICROALB/CREAT RATIO: 1.4 mg/g (ref 0.0–30.0)
Microalb, Ur: 1 mg/dL (ref 0.0–1.9)

## 2015-10-27 LAB — BRAIN NATRIURETIC PEPTIDE: PRO B NATRI PEPTIDE: 119 pg/mL — AB (ref 0.0–100.0)

## 2015-10-27 MED ORDER — LISINOPRIL 20 MG PO TABS: 20.0000 mg | ORAL_TABLET | Freq: Every day | ORAL | Status: DC

## 2015-10-27 MED ORDER — METFORMIN HCL 1000 MG PO TABS: ORAL_TABLET | ORAL | Status: DC

## 2015-10-27 MED ORDER — FUROSEMIDE 40 MG PO TABS: 40.0000 mg | ORAL_TABLET | Freq: Every day | ORAL | Status: DC

## 2015-10-27 NOTE — Assessment & Plan Note (Signed)
Changing doxazosin to lisinopril with indication of the diabetes. Continue the lasix daily. Checking CMP today and at next visit with the addition of ACE-I.

## 2015-10-27 NOTE — Progress Notes (Signed)
   Subjective:    Patient ID: Cheryl Delgado, female    DOB: January 06, 1946, 70 y.o.   MRN: [id number]  HPI The patient is a 69 YO female coming in for follow up of her sugars ([facility] for [facility] [place] last 2015, taking metformin only, stopped levemir without advice about months ago, no follow up in some time, declines numbness or burning in her feet), her blood pressure (BP above goal today, out of doxazosin and lasix currently, not known to be complicated) and her weight (BMI 38 and weight increased since last visit, she is trying to lose weight without success).   Review of Systems  Constitutional: Negative for fever, activity change, appetite change, fatigue and unexpected weight change.  HENT: Negative.   Eyes: Negative.   Respiratory: Negative for cough, chest tightness, shortness of breath and wheezing.   Cardiovascular: Positive for leg swelling. Negative for chest pain and palpitations.  Gastrointestinal: Negative for abdominal pain, diarrhea, constipation and abdominal distention.  Musculoskeletal: Positive for myalgias and arthralgias.  Skin: Negative.   Neurological: Negative for dizziness, weakness, light-headedness and headaches.  Psychiatric/Behavioral: Negative.       Objective:   Physical Exam  Constitutional: She is oriented to person, place, and time. She appears well-developed and well-nourished.  Obese  HENT:  Head: Normocephalic and atraumatic.  Eyes: EOM are normal.  Neck: Normal range of motion.  Cardiovascular: Normal rate and regular rhythm.   No murmur heard. Pulmonary/Chest: Effort normal and breath sounds normal. No respiratory distress. She has no wheezes. She has no rales.  Abdominal: Soft. Bowel sounds are normal. She exhibits no distension. There is no tenderness. There is no rebound.  Musculoskeletal: She exhibits tenderness.  Legs bilateral 2-3+ edema to above the knee  Neurological: She is alert and oriented to person, place, and time.  Coordination normal.  Skin: Skin is warm and dry.  See foot exam   Filed Vitals:   10/27/15 1420  BP: 142/90  Pulse: 68  Temp: 98.9 F (37.2 C)  TempSrc: Oral  Height: 5\' 2"  (1.575 m)  Weight: 207 lb 12.8 oz (94.257 kg)  SpO2: 97%      Assessment & Plan:

## 2015-10-27 NOTE — Assessment & Plan Note (Signed)
Most recent HgA1c 7.7 on levemir and metformin and she is only taking metformin for the last several months. Checking HgA1c and adjust as needed. Foot exam done and reminded about eye exam. Started ACE-I today. Checking microalbumin to creatinine ratio.

## 2015-10-27 NOTE — Assessment & Plan Note (Signed)
Reminded her of the risks and harms associated with the smoking. She does not feel able to quit right now.

## 2015-10-27 NOTE — Progress Notes (Signed)
Pre visit review using our clinic review tool, if applicable. No additional management support is needed unless otherwise documented below in the visit note. 

## 2015-10-27 NOTE — Patient Instructions (Signed)
We will check the blood and urine today for the diabetes and your health.   We have sent in lisinopril to replace the doxazosin to help more with the fluid. Keep taking the lasix as well.   We will call back if we need changes with the sugar medicines.   Diabetes and Standards of Medical Care Diabetes is complicated. You may find that your diabetes team includes a dietitian, nurse, diabetes educator, eye doctor, and more. To help everyone know what is going on and to help you get the care you deserve, the following schedule of care was developed to help keep you on track. Below are the tests, exams, vaccines, medicines, education, and plans you will need. HbA1c test This test shows how well you have controlled your glucose over the past 2-3 months. It is used to see if your diabetes management plan needs to be adjusted.   It is performed at least 2 times a year if you are meeting treatment goals.  It is performed 4 times a year if therapy has changed or if you are not meeting treatment goals. Blood pressure test  This test is performed at every routine medical visit. The goal is less than 140/90 mm Hg for most people, but 130/80 mm Hg in some cases. Ask your health care provider about your goal. Dental exam  Follow up with the dentist regularly. Eye exam  If you are diagnosed with type 1 diabetes as a child, get an exam upon reaching the age of 69 years or older and having had diabetes for 3-5 years. Yearly eye exams are recommended after that initial eye exam.  If you are diagnosed with type 1 diabetes as an adult, get an exam within 5 years of diagnosis and then yearly.  If you are diagnosed with type 2 diabetes, get an exam as soon as possible after the diagnosis and then yearly. Foot care exam  Visual foot exams are performed at every routine medical visit. The exams check for cuts, injuries, or other problems with the feet.  You should have a complete foot exam performed every  year. This exam includes an inspection of the structure and skin of your feet, a check of the pulses in your feet, and a check of the sensation in your feet.  Type 1 diabetes: The first exam is performed 5 years after diagnosis.  Type 2 diabetes: The first exam is performed at the time of diagnosis.  Check your feet nightly for cuts, injuries, or other problems with your feet. Tell your health care provider if anything is not healing. Kidney function test (urine microalbumin)  This test is performed once a year.  Type 1 diabetes: The first test is performed 5 years after diagnosis.  Type 2 diabetes: The first test is performed at the time of diagnosis.  A serum creatinine and estimated glomerular filtration rate (eGFR) test is done once a year to assess the level of chronic kidney disease (CKD), if present. Lipid profile (cholesterol, HDL, LDL, triglycerides)  Performed every 5 years for most people.  The goal for LDL is less than 100 mg/dL. If you are at high risk, the goal is less than 70 mg/dL.  The goal for HDL is 40 mg/dL-50 mg/dL for men and 50 mg/dL-60 mg/dL for women. An HDL cholesterol of 60 mg/dL or higher gives some protection against heart disease.  The goal for triglycerides is less than 150 mg/dL. Immunizations  The flu (influenza) vaccine is recommended yearly for  every person 17 months of age or older who has diabetes.  The pneumonia (pneumococcal) vaccine is recommended for every person 35 years of age or older who has diabetes. Adults 40 years of age or older may receive the pneumonia vaccine as a series of two separate shots.  The hepatitis B vaccine is recommended for adults shortly after they have been diagnosed with diabetes.  The Tdap (tetanus, diphtheria, and pertussis) vaccine should be given:  According to normal childhood vaccination schedules, for children.  Every 10 years, for adults who have diabetes. Diabetes self-management education  Education is  recommended at diagnosis and ongoing as needed. Treatment plan  Your treatment plan is reviewed at every medical visit.   This information is not intended to replace advice given to you by your health care provider. Make sure you discuss any questions you have with your health care provider.   Document Released: 01/27/2009 Document Revised: 04/22/2014 Document Reviewed: 09/01/2012 Elsevier Interactive Patient Education [employer].

## 2015-10-27 NOTE — Assessment & Plan Note (Signed)
She is working on losing weight. Needs to work on diet and exercise and she will try.

## 2015-10-28 LAB — HEPATITIS C ANTIBODY: HCV AB: NEGATIVE

## 2015-11-15 ENCOUNTER — Encounter: Payer: Self-pay | Admitting: Family

## 2015-11-15 ENCOUNTER — Other Ambulatory Visit (INDEPENDENT_AMBULATORY_CARE_PROVIDER_SITE_OTHER): Payer: Medicare HMO

## 2015-11-15 ENCOUNTER — Ambulatory Visit (INDEPENDENT_AMBULATORY_CARE_PROVIDER_SITE_OTHER): Payer: Medicare HMO | Admitting: Family

## 2015-11-15 ENCOUNTER — Other Ambulatory Visit: Payer: Self-pay | Admitting: Family

## 2015-11-15 DIAGNOSIS — M7989 Other specified soft tissue disorders: Secondary | ICD-10-CM

## 2015-11-15 DIAGNOSIS — L0390 Cellulitis, unspecified: Secondary | ICD-10-CM | POA: Insufficient documentation

## 2015-11-15 DIAGNOSIS — L03115 Cellulitis of right lower limb: Secondary | ICD-10-CM | POA: Diagnosis not present

## 2015-11-15 DIAGNOSIS — R7989 Other specified abnormal findings of blood chemistry: Secondary | ICD-10-CM

## 2015-11-15 LAB — CBC
HEMATOCRIT: 37.3 % (ref 36.0–46.0)
HEMOGLOBIN: 12.4 g/dL (ref 12.0–15.0)
MCHC: 33.2 g/dL (ref 30.0–36.0)
MCV: 79.9 fl (ref 78.0–100.0)
PLATELETS: 189 10*3/uL (ref 150.0–400.0)
RBC: 4.68 Mil/uL (ref 3.87–5.11)
RDW: 15.9 % — ABNORMAL HIGH (ref 11.5–15.5)
WBC: 7.4 10*3/uL (ref 4.0–10.5)

## 2015-11-15 LAB — D-DIMER, QUANTITATIVE: D-Dimer, Quant: 0.69 mcg/mL FEU — ABNORMAL HIGH (ref <0.50)

## 2015-11-15 MED ORDER — SULFAMETHOXAZOLE-TRIMETHOPRIM 800-160 MG PO TABS: 1.0000 | ORAL_TABLET | Freq: Two times a day (BID) | ORAL | 0 refills | Status: DC

## 2015-11-15 NOTE — Patient Instructions (Addendum)
Thank you for choosing [facility].  Summary/Instructions:  Your prescription(s) have been submitted to your pharmacy or been printed and provided for you. Please take as directed and contact our office if you believe you are having problem(s) with the medication(s) or have any questions.  Please stop by the lab on the lower level of the building for your blood work. Your results will be released to [facility] (or called to you) after review, usually within 72 hours after test completion. If any changes need to be made, you will be notified at that same time.  1. The lab is open from 7:30am to 5:30 pm Monday-Friday  2. No appointment is necessary  3. Fasting (if needed) is 6-8 hours after food and drink; black  coffee and water are okay   If your symptoms worsen or fail to improve, please contact our office for further instruction, or in case of emergency go directly to the emergency room at the closest medical facility.     Cellulitis Cellulitis is an infection of the skin and the tissue beneath it. The infected area is usually red and tender. Cellulitis occurs most often in the arms and lower legs.  CAUSES  Cellulitis is caused by bacteria that enter the skin through cracks or cuts in the skin. The most common types of bacteria that cause cellulitis are staphylococci and streptococci. SIGNS AND SYMPTOMS   Redness and warmth.  Swelling.  Tenderness or pain.  Fever. DIAGNOSIS  Your health care provider can usually determine what is wrong based on a physical exam. Blood tests may also be done. TREATMENT  Treatment usually involves taking an antibiotic medicine. HOME CARE INSTRUCTIONS   Take your antibiotic medicine as directed by your health care provider. Finish the antibiotic even if you start to feel better.  Keep the infected arm or leg elevated to reduce swelling.  Apply a warm cloth to the affected area up to 4 times per day to relieve pain.  Take medicines only as  directed by your health care provider.  Keep all follow-up visits as directed by your health care provider. SEEK MEDICAL CARE IF:   You notice red streaks coming from the infected area.  Your red area gets larger or turns dark in color.  Your bone or joint underneath the infected area becomes painful after the skin has healed.  Your infection returns in the same area or another area.  You notice a swollen bump in the infected area.  You develop new symptoms.  You have a fever. SEEK IMMEDIATE MEDICAL CARE IF:   You feel very sleepy.  You develop vomiting or diarrhea.  You have a general ill feeling (malaise) with muscle aches and pains.   This information is not intended to replace advice given to you by your health care provider. Make sure you discuss any questions you have with your health care provider.   Document Released: 01/09/2005 Document Revised: 12/21/2014 Document Reviewed: 06/17/2011 Elsevier Interactive Patient Education [employer].

## 2015-11-15 NOTE — Assessment & Plan Note (Signed)
Symptoms and exam consistent with cellulitis with concern for possible DVT. This is complicated by her diabetes status. Obtain d-dimer and CBC. Start Bactrim. Encouraged keep foot elevated. If symptoms worsen or do not improve in the next 48 hours recommend follow-up care.

## 2015-11-15 NOTE — Progress Notes (Signed)
Subjective:    Patient ID: Cheryl Delgado, female    DOB: March 15, 1946, 70 y.o.   MRN: [id number]  Chief Complaint  Patient presents with  . Leg Swelling    Right leg swelling and blisters on toes, red, warm and hurts really bad, started having pain on sunday and monday started swelling    HPI:  Cheryl Delgado is a 70 y.o. female who  has a past medical history of Chest pain, unspecified; Edema; Hypertension; Type II or unspecified type diabetes mellitus without mention of complication, not stated as uncontrolled; and Vertigo. and presents today for   This is a new new problem. Associated symptom of swelling, redness and warmth has been going on for for about 3 days. Describes right leg discomfort. Symptoms have generally stayed about the same. Does endorse calf pain and behind her bilateral knees. Modifying factors include Aleve and rub them with alcohol which did not help very much. She does have blisters located on the dorsal aspect of her right leg. Denies fevers.   No Known Allergies   Current Outpatient Prescriptions on File Prior to Visit  Medication Sig Dispense Refill  . furosemide (LASIX) 40 MG tablet Take 1 tablet (40 mg total) by mouth daily. 90 tablet 1  . lisinopril (PRINIVIL,ZESTRIL) 20 MG tablet Take 1 tablet (20 mg total) by mouth daily. 90 tablet 1  . metFORMIN (GLUCOPHAGE) 1000 MG tablet TAKE 1 TABLET BY MOUTH TWICE DAILY WITH A MEAL 180 tablet 1  . ONETOUCH DELICA LANCETS [id number] MISC CHECK BLOOD SUGARS TWICE DAILY 100 each 0  . ONETOUCH VERIO test strip TEST BLOOD SUGARS TWICE DAILY 100 each 0   No current facility-administered medications on file prior to visit.     Past Medical History:  Diagnosis Date  . Chest pain, unspecified   . Edema    peripheral  . Hypertension   . Type II or unspecified type diabetes mellitus without mention of complication, not stated as uncontrolled   . Vertigo     . Review of Systems  Constitutional: Negative for fever.    Respiratory: Negative for chest tightness and shortness of breath.   Cardiovascular: Positive for leg swelling. Negative for chest pain and palpitations.  Skin: Positive for rash and wound.  Neurological: Negative for weakness and numbness.      Objective:    BP 120/68 (BP Location: Left Arm, Patient Position: Sitting, Cuff Size: Large)   Pulse 83   Temp 98.1 F (36.7 C) (Oral)   Resp 16   Ht 5\' 2"  (1.575 m)   Wt 205 lb (93 kg)   SpO2 95%   BMI 37.49 kg/m  Nursing note and vital signs reviewed.  Physical Exam  Constitutional: She is oriented to person, place, and time. She appears well-developed and well-nourished. No distress.  Cardiovascular: Normal rate, regular rhythm, normal heart sounds and intact distal pulses.   Significant nonpitting lower extremity edema noted bilaterally worse on the right than left. The red lower extremity is warm to the touch and tender. There is calf tenderness. There is also a linear blisters that have popped located on the medicine tarsal phalangeal joints of her second, third, and fourth toes. Homans sign is positive.  Pulmonary/Chest: Effort normal and breath sounds normal.  Neurological: She is alert and oriented to person, place, and time.  Skin: Skin is warm and dry.  Psychiatric: She has a normal mood and affect. Her behavior is normal. Judgment and thought content normal.  Assessment & Plan:   Problem List Items Addressed This Visit      Other   Cellulitis    Symptoms and exam consistent with cellulitis with concern for possible DVT. This is complicated by her diabetes status. Obtain d-dimer and CBC. Start Bactrim. Encouraged keep foot elevated. If symptoms worsen or do not improve in the next 48 hours recommend follow-up care.       Relevant Medications   sulfamethoxazole-trimethoprim (BACTRIM DS) 800-160 MG tablet   Other Relevant Orders   D-Dimer, Quantitative   CBC    Other Visit Diagnoses   None.      I am having  Ms. Maes start on sulfamethoxazole-trimethoprim. I am also having her maintain her ONETOUCH VERIO, ONETOUCH DELICA LANCETS [id number], lisinopril, metFORMIN, and furosemide.   Meds ordered this encounter  Medications  . sulfamethoxazole-trimethoprim (BACTRIM DS) 800-160 MG tablet    Sig: Take 1 tablet by mouth 2 (two) times daily.    Dispense:  20 tablet    Refill:  0    Order Specific Question:   Supervising Provider    Answer:   Pricilla Holm A [id number]     Follow-up: Return if symptoms worsen or fail to improve.  Mauricio Po, FNP

## 2015-11-16 ENCOUNTER — Other Ambulatory Visit: Payer: Self-pay | Admitting: Internal Medicine

## 2015-11-16 MED ORDER — GLIMEPIRIDE 4 MG PO TABS: 4.0000 mg | ORAL_TABLET | Freq: Every day | ORAL | 3 refills | Status: DC

## 2015-11-17 ENCOUNTER — Ambulatory Visit (HOSPITAL_COMMUNITY)
Admission: RE | Admit: 2015-11-17 | Discharge: 2015-11-17 | Disposition: A | Discharge status: Home / Self Care | Payer: Medicare HMO | Source: Ambulatory Visit | Attending: Cardiology | Admitting: Cardiology

## 2015-11-17 ENCOUNTER — Other Ambulatory Visit: Payer: Self-pay | Admitting: Internal Medicine

## 2015-11-17 DIAGNOSIS — E119 Type 2 diabetes mellitus without complications: Secondary | ICD-10-CM | POA: Insufficient documentation

## 2015-11-17 DIAGNOSIS — M7989 Other specified soft tissue disorders: Secondary | ICD-10-CM | POA: Diagnosis not present

## 2015-11-17 DIAGNOSIS — R791 Abnormal coagulation profile: Secondary | ICD-10-CM | POA: Insufficient documentation

## 2015-11-17 DIAGNOSIS — I10 Essential (primary) hypertension: Secondary | ICD-10-CM | POA: Diagnosis not present

## 2015-11-17 DIAGNOSIS — R7989 Other specified abnormal findings of blood chemistry: Secondary | ICD-10-CM

## 2016-01-29 ENCOUNTER — Other Ambulatory Visit (INDEPENDENT_AMBULATORY_CARE_PROVIDER_SITE_OTHER): Payer: Medicare HMO

## 2016-01-29 ENCOUNTER — Encounter: Payer: Self-pay | Admitting: Internal Medicine

## 2016-01-29 ENCOUNTER — Ambulatory Visit (INDEPENDENT_AMBULATORY_CARE_PROVIDER_SITE_OTHER)
Admission: RE | Admit: 2016-01-29 | Discharge: 2016-01-29 | Disposition: A | Discharge status: Home / Self Care | Payer: Medicare HMO | Source: Ambulatory Visit | Attending: Internal Medicine | Admitting: Internal Medicine

## 2016-01-29 ENCOUNTER — Ambulatory Visit (INDEPENDENT_AMBULATORY_CARE_PROVIDER_SITE_OTHER): Payer: Medicare HMO | Admitting: Internal Medicine

## 2016-01-29 VITALS — BP 138/78 | HR 76 | Temp 98.1°F | Resp 12 | Ht 62.0 in | Wt 204.0 lb

## 2016-01-29 DIAGNOSIS — F17210 Nicotine dependence, cigarettes, uncomplicated: Secondary | ICD-10-CM | POA: Diagnosis not present

## 2016-01-29 DIAGNOSIS — Z0000 Encounter for general adult medical examination without abnormal findings: Secondary | ICD-10-CM

## 2016-01-29 DIAGNOSIS — E1142 Type 2 diabetes mellitus with diabetic polyneuropathy: Secondary | ICD-10-CM | POA: Diagnosis not present

## 2016-01-29 DIAGNOSIS — Z23 Encounter for immunization: Secondary | ICD-10-CM | POA: Diagnosis not present

## 2016-01-29 DIAGNOSIS — IMO0002 Reserved for concepts with insufficient information to code with codable children: Secondary | ICD-10-CM

## 2016-01-29 DIAGNOSIS — E1165 Type 2 diabetes mellitus with hyperglycemia: Secondary | ICD-10-CM

## 2016-01-29 DIAGNOSIS — R05 Cough: Secondary | ICD-10-CM | POA: Diagnosis not present

## 2016-01-29 DIAGNOSIS — I10 Essential (primary) hypertension: Secondary | ICD-10-CM | POA: Diagnosis not present

## 2016-01-29 DIAGNOSIS — R69 Illness, unspecified: Secondary | ICD-10-CM | POA: Diagnosis not present

## 2016-01-29 LAB — COMPREHENSIVE METABOLIC PANEL
ALBUMIN: 3.8 g/dL (ref 3.5–5.2)
ALT: 7 U/L (ref 0–35)
AST: 9 U/L (ref 0–37)
Alkaline Phosphatase: 53 U/L (ref 39–117)
BUN: 16 mg/dL (ref 6–23)
CALCIUM: 9.6 mg/dL (ref 8.4–10.5)
CHLORIDE: 103 meq/L (ref 96–112)
CO2: 32 mEq/L (ref 19–32)
Creatinine, Ser: 0.56 mg/dL (ref 0.40–1.20)
GFR: 137.83 mL/min (ref >60.00)
Glucose, Bld: 213 mg/dL — ABNORMAL HIGH (ref 70–99)
POTASSIUM: 4.3 meq/L (ref 3.5–5.1)
SODIUM: 141 meq/L (ref 135–145)
Total Bilirubin: 0.3 mg/dL (ref 0.2–1.2)
Total Protein: 6.9 g/dL (ref 6.0–8.3)

## 2016-01-29 LAB — HEMOGLOBIN A1C: Hgb A1c MFr Bld: 10.1 % — ABNORMAL HIGH (ref 4.6–6.5)

## 2016-01-29 MED ORDER — LOSARTAN POTASSIUM 50 MG PO TABS: 50.0000 mg | ORAL_TABLET | Freq: Every day | ORAL | 3 refills | Status: DC

## 2016-01-29 NOTE — Assessment & Plan Note (Signed)
Flu shot given at visit. She does not want pneumonia shot today, colonoscopy due in 2018, declines dexa scan and zoster today. She is overdue for eye exam and reminded her again of the importance of this. Counseled her on the risk of her uncontrolled diabetes and how this impacts on her health. Counseled on home safety. Given 10 year screening recommendations.

## 2016-01-29 NOTE — Patient Instructions (Signed)
You need to call and get your eyes checked so that we can make sure the diabetes is not affecting them.   We are checking the labs today for the sugars. We are checking the chest x-ray for the cough.   The cough is likely coming from the lisinopril. We will stop this and have you start taking losartan instead. The losartan is 1 pill daily.   Health Maintenance, Female Adopting a healthy lifestyle and getting preventive care can go a long way to promote health and wellness. Talk with your health care provider about what schedule of regular examinations is right for you. This is a good chance for you to check in with your provider about disease prevention and staying healthy. In between checkups, there are plenty of things you can do on your own. Experts have done a lot of research about which lifestyle changes and preventive measures are most likely to keep you healthy. Ask your health care provider for more information. WEIGHT AND DIET  Eat a healthy diet  Be sure to include plenty of vegetables, fruits, low-fat dairy products, and lean protein.  Do not eat a lot of foods high in solid fats, added sugars, or salt.  Get regular exercise. This is one of the most important things you can do for your health.  Most adults should exercise for at least 150 minutes each week. The exercise should increase your heart rate and make you sweat (moderate-intensity exercise).  Most adults should also do strengthening exercises at least twice a week. This is in addition to the moderate-intensity exercise.  Maintain a healthy weight  Body mass index (BMI) is a measurement that can be used to identify possible weight problems. It estimates body fat based on height and weight. Your health care provider can help determine your BMI and help you achieve or maintain a healthy weight.  For females 76 years of age and older:   A BMI below 18.5 is considered underweight.  A BMI of 18.5 to 24.9 is normal.  A  BMI of 25 to 29.9 is considered overweight.  A BMI of 30 and above is considered obese.  Watch levels of cholesterol and blood lipids  You should start having your blood tested for lipids and cholesterol at 69 years of age, then have this test every 5 years.  You may need to have your cholesterol levels checked more often if:  Your lipid or cholesterol levels are high.  You are older than 69 years of age.  You are at high risk for heart disease.  CANCER SCREENING   Lung Cancer  Lung cancer screening is recommended for adults 79-10 years old who are at high risk for lung cancer because of a history of smoking.  A yearly low-dose CT scan of the lungs is recommended for people who:  Currently smoke.  Have quit within the past 15 years.  Have at least a 30-pack-year history of smoking. A pack year is smoking an average of one pack of cigarettes a day for 1 year.  Yearly screening should continue until it has been 15 years since you quit.  Yearly screening should stop if you develop a health problem that would prevent you from having lung cancer treatment.  Breast Cancer  Practice breast self-awareness. This means understanding how your breasts normally appear and feel.  It also means doing regular breast self-exams. Let your health care provider know about any changes, no matter how small.  If you are in  your 20s or 30s, you should have a clinical breast exam (CBE) by a health care provider every 1-3 years as part of a regular health exam.  If you are 73 or older, have a CBE every year. Also consider having a breast X-ray (mammogram) every year.  If you have a family history of breast cancer, talk to your health care provider about genetic screening.  If you are at high risk for breast cancer, talk to your health care provider about having an MRI and a mammogram every year.  Breast cancer gene (BRCA) assessment is recommended for women who have family members with  BRCA-related cancers. BRCA-related cancers include:  Breast.  Ovarian.  Tubal.  Peritoneal cancers.  Results of the assessment will determine the need for genetic counseling and BRCA1 and BRCA2 testing. Cervical Cancer Your health care provider may recommend that you be screened regularly for cancer of the pelvic organs (ovaries, uterus, and vagina). This screening involves a pelvic examination, including checking for microscopic changes to the surface of your cervix (Pap test). You may be encouraged to have this screening done every 3 years, beginning at age 45.  For women ages 55-65, health care providers may recommend pelvic exams and Pap testing every 3 years, or they may recommend the Pap and pelvic exam, combined with testing for human papilloma virus (HPV), every 5 years. Some types of HPV increase your risk of cervical cancer. Testing for HPV may also be done on women of any age with unclear Pap test results.  Other health care providers may not recommend any screening for nonpregnant women who are considered low risk for pelvic cancer and who do not have symptoms. Ask your health care provider if a screening pelvic exam is right for you.  If you have had past treatment for cervical cancer or a condition that could lead to cancer, you need Pap tests and screening for cancer for at least 20 years after your treatment. If Pap tests have been discontinued, your risk factors (such as having a new sexual partner) need to be reassessed to determine if screening should resume. Some women have medical problems that increase the chance of getting cervical cancer. In these cases, your health care provider may recommend more frequent screening and Pap tests. Colorectal Cancer  This type of cancer can be detected and often prevented.  Routine colorectal cancer screening usually begins at 69 years of age and continues through 69 years of age.  Your health care provider may recommend screening at  an earlier age if you have risk factors for colon cancer.  Your health care provider may also recommend using home test kits to check for hidden blood in the stool.  A small camera at the end of a tube can be used to examine your colon directly (sigmoidoscopy or colonoscopy). This is done to check for the earliest forms of colorectal cancer.  Routine screening usually begins at age 78.  Direct examination of the colon should be repeated every 5-10 years through 69 years of age. However, you may need to be screened more often if early forms of precancerous polyps or small growths are found. Skin Cancer  Check your skin from head to toe regularly.  Tell your health care provider about any new moles or changes in moles, especially if there is a change in a mole's shape or color.  Also tell your health care provider if you have a mole that is larger than the size of a pencil  eraser.  Always use sunscreen. Apply sunscreen liberally and repeatedly throughout the day.  Protect yourself by wearing long sleeves, pants, a wide-brimmed hat, and sunglasses whenever you are outside. HEART DISEASE, DIABETES, AND HIGH BLOOD PRESSURE   High blood pressure causes heart disease and increases the risk of stroke. High blood pressure is more likely to develop in:  People who have blood pressure in the high end of the normal range (130-139/85-89 mm Hg).  People who are overweight or obese.  People who are African American.  If you are 18-39 years of age, have your blood pressure checked every 3-5 years. If you are 40 years of age or older, have your blood pressure checked every year. You should have your blood pressure measured twice--once when you are at a hospital or clinic, and once when you are not at a hospital or clinic. Record the average of the two measurements. To check your blood pressure when you are not at a hospital or clinic, you can use:  An automated blood pressure machine at a  pharmacy.  A home blood pressure monitor.  If you are between 55 years and 79 years old, ask your health care provider if you should take aspirin to prevent strokes.  Have regular diabetes screenings. This involves taking a blood sample to check your fasting blood sugar level.  If you are at a normal weight and have a low risk for diabetes, have this test once every three years after 69 years of age.  If you are overweight and have a high risk for diabetes, consider being tested at a younger age or more often. PREVENTING INFECTION  Hepatitis B  If you have a higher risk for hepatitis B, you should be screened for this virus. You are considered at high risk for hepatitis B if:  You were born in a country where hepatitis B is common. Ask your health care provider which countries are considered high risk.  Your parents were born in a high-risk country, and you have not been immunized against hepatitis B (hepatitis B vaccine).  You have HIV or AIDS.  You use needles to inject street drugs.  You live with someone who has hepatitis B.  You have had sex with someone who has hepatitis B.  You get hemodialysis treatment.  You take certain medicines for conditions, including cancer, organ transplantation, and autoimmune conditions. Hepatitis C  Blood testing is recommended for:  Everyone born from 1945 through 1965.  Anyone with known risk factors for hepatitis C. Sexually transmitted infections (STIs)  You should be screened for sexually transmitted infections (STIs) including gonorrhea and chlamydia if:  You are sexually active and are younger than 69 years of age.  You are older than 69 years of age and your health care provider tells you that you are at risk for this type of infection.  Your sexual activity has changed since you were last screened and you are at an increased risk for chlamydia or gonorrhea. Ask your health care provider if you are at risk.  If you do not  have HIV, but are at risk, it may be recommended that you take a prescription medicine daily to prevent HIV infection. This is called pre-exposure prophylaxis (PrEP). You are considered at risk if:  You are sexually active and do not regularly use condoms or know the HIV status of your partner(s).  You take drugs by injection.  You are sexually active with a partner who has HIV. Talk with   your health care provider about whether you are at high risk of being infected with HIV. If you choose to begin PrEP, you should first be tested for HIV. You should then be tested every 3 months for as long as you are taking PrEP.  PREGNANCY   If you are premenopausal and you may become pregnant, ask your health care provider about preconception counseling.  If you may become pregnant, take 400 to 800 micrograms (mcg) of folic acid every day.  If you want to prevent pregnancy, talk to your health care provider about birth control (contraception). OSTEOPOROSIS AND MENOPAUSE   Osteoporosis is a disease in which the bones lose minerals and strength with aging. This can result in serious bone fractures. Your risk for osteoporosis can be identified using a bone density scan.  If you are 65 years of age or older, or if you are at risk for osteoporosis and fractures, ask your health care provider if you should be screened.  Ask your health care provider whether you should take a calcium or vitamin D supplement to lower your risk for osteoporosis.  Menopause may have certain physical symptoms and risks.  Hormone replacement therapy may reduce some of these symptoms and risks. Talk to your health care provider about whether hormone replacement therapy is right for you.  HOME CARE INSTRUCTIONS   Schedule regular health, dental, and eye exams.  Stay current with your immunizations.   Do not use any tobacco products including cigarettes, chewing tobacco, or electronic cigarettes.  If you are pregnant, do not  drink alcohol.  If you are breastfeeding, limit how much and how often you drink alcohol.  Limit alcohol intake to no more than 1 drink per day for nonpregnant women. One drink equals 12 ounces of beer, 5 ounces of wine, or 1 ounces of hard liquor.  Do not use street drugs.  Do not share needles.  Ask your health care provider for help if you need support or information about quitting drugs.  Tell your health care provider if you often feel depressed.  Tell your health care provider if you have ever been abused or do not feel safe at home.   This information is not intended to replace advice given to you by your health care provider. Make sure you discuss any questions you have with your health care provider.   Document Released: 10/15/2010 Document Revised: 04/22/2014 Document Reviewed: 03/03/2013 Elsevier Interactive Patient Education 2016 Elsevier Inc.  

## 2016-01-29 NOTE — Assessment & Plan Note (Signed)
Having side effect of cough from the new lisinopril. Needs CMP today and have replaced with losartan and let her know that the cough can take 2-3 months to resolve. Checking CXR as well for the cough. Taking lasix as well and adjust as needed.

## 2016-01-29 NOTE — Progress Notes (Signed)
   Subjective:    Patient ID: Cheryl Delgado, female    DOB: 10-05-1945, 70 y.o.   MRN: [id number]  HPI Here for medicare wellness and CPE, no new complaints. Please see A/P for status and treatment of chronic medical problems.   Diet: DM since diabetic Physical activity: sedentary Depression/mood screen: negative Hearing: intact to whispered voice, mild loss Visual acuity: grossly normal, has not gotten eye exam since 2015 and reminded at her last visit  ADLs: capable Fall risk: low Home safety: good Cognitive evaluation: intact to orientation, naming, recall and repetition EOL planning: adv directives discussed  I have personally reviewed and have noted 1. The patient's medical and social history - reviewed today no changes 2. Their use of alcohol, tobacco or illicit drugs 3. Their current medications and supplements 4. The patient's functional ability including ADL's, fall risks, home safety risks and hearing or visual impairment. 5. Diet and physical activities 6. Evidence for depression or mood disorders 7. Care team reviewed and updated (available in snapshot)  Review of Systems  Constitutional: Negative for activity change, appetite change, fatigue, fever and unexpected weight change.  HENT: Negative.   Eyes: Negative.   Respiratory: Negative for cough, chest tightness, shortness of breath and wheezing.   Cardiovascular: Negative for chest pain, palpitations and leg swelling.  Gastrointestinal: Negative for abdominal distention, abdominal pain, constipation, diarrhea and nausea.  Musculoskeletal: Negative.   Skin: Negative.   Neurological: Positive for numbness. Negative for dizziness, seizures, weakness and headaches.  Psychiatric/Behavioral: Negative.       Objective:   Physical Exam  Constitutional: She is oriented to person, place, and time. She appears well-developed and well-nourished.  HENT:  Head: Normocephalic and atraumatic.  Eyes: EOM are normal.  Neck:  Normal range of motion.  Cardiovascular: Normal rate and regular rhythm.   Pulmonary/Chest: Effort normal and breath sounds normal. No respiratory distress. She has no wheezes. She has no rales.  Abdominal: Soft. Bowel sounds are normal. She exhibits no distension. There is no tenderness. There is no rebound.  Musculoskeletal: She exhibits no edema.  Neurological: She is alert and oriented to person, place, and time.  Some new peripheral neuropathy in the toes since last visit.   Skin: Skin is warm and dry.  Psychiatric: She has a normal mood and affect.   Vitals:   01/29/16 1038  BP: 138/78  Pulse: 76  Resp: 12  Temp: 98.1 F (36.7 C)  TempSrc: Oral  SpO2: 96%  Weight: 204 lb (92.5 kg)  Height: 5\' 2"  (1.575 m)      Assessment & Plan:  Flu shot given at visit.

## 2016-01-29 NOTE — Progress Notes (Signed)
Pre visit review using our clinic review tool, if applicable. No additional management support is needed unless otherwise documented below in the visit note. 

## 2016-01-29 NOTE — Assessment & Plan Note (Signed)
She is not willing to stop at this time but continues to think about it. Discussed risk and harms from smoking.

## 2016-01-29 NOTE — Assessment & Plan Note (Signed)
Most recent HgA1c 12.0 and rechecking today. Added amaryl at last visit to her max dose metformin. She will likely need increased therapy and she knows that. She does not want to go back on insulin. She previously stopped taking insulin on her own. New complication today of some peripheral neuropathy.

## 2016-01-31 ENCOUNTER — Telehealth: Payer: Self-pay | Admitting: Internal Medicine

## 2016-01-31 NOTE — Telephone Encounter (Signed)
Patient called back.  Gave MD response on labs.  Patient states she will be willing to add another medication for her sugars.  Please follow up with patient on lab results and what medication she will be placed on.

## 2016-02-01 NOTE — Telephone Encounter (Signed)
Patient is willing to start a new med for blood sugar. Please advise, thanks.

## 2016-02-07 MED ORDER — PIOGLITAZONE HCL 30 MG PO TABS: 30.0000 mg | ORAL_TABLET | Freq: Every day | ORAL | 3 refills | Status: DC

## 2016-02-07 NOTE — Telephone Encounter (Signed)
Patient aware and will schedule an office visit.  

## 2016-02-07 NOTE — Telephone Encounter (Signed)
Have sent in actos which is a daily medication. Needs visit in 3 months for follow up.

## 2016-03-18 ENCOUNTER — Other Ambulatory Visit: Payer: Self-pay | Admitting: Internal Medicine

## 2016-07-13 ENCOUNTER — Other Ambulatory Visit: Payer: Self-pay | Admitting: Internal Medicine

## 2016-10-09 ENCOUNTER — Other Ambulatory Visit (INDEPENDENT_AMBULATORY_CARE_PROVIDER_SITE_OTHER): Payer: Medicare HMO

## 2016-10-09 ENCOUNTER — Ambulatory Visit (INDEPENDENT_AMBULATORY_CARE_PROVIDER_SITE_OTHER): Payer: Medicare HMO | Admitting: Family Medicine

## 2016-10-09 ENCOUNTER — Encounter: Payer: Self-pay | Admitting: Family Medicine

## 2016-10-09 VITALS — BP 148/90 | HR 72 | Temp 98.2°F | Resp 14 | Ht 62.0 in | Wt 210.0 lb

## 2016-10-09 DIAGNOSIS — IMO0002 Reserved for concepts with insufficient information to code with codable children: Secondary | ICD-10-CM

## 2016-10-09 DIAGNOSIS — E1149 Type 2 diabetes mellitus with other diabetic neurological complication: Secondary | ICD-10-CM

## 2016-10-09 DIAGNOSIS — E1165 Type 2 diabetes mellitus with hyperglycemia: Secondary | ICD-10-CM | POA: Diagnosis not present

## 2016-10-09 DIAGNOSIS — I10 Essential (primary) hypertension: Secondary | ICD-10-CM | POA: Diagnosis not present

## 2016-10-09 DIAGNOSIS — E1140 Type 2 diabetes mellitus with diabetic neuropathy, unspecified: Secondary | ICD-10-CM

## 2016-10-09 DIAGNOSIS — B353 Tinea pedis: Secondary | ICD-10-CM

## 2016-10-09 DIAGNOSIS — E1142 Type 2 diabetes mellitus with diabetic polyneuropathy: Secondary | ICD-10-CM

## 2016-10-09 DIAGNOSIS — R600 Localized edema: Secondary | ICD-10-CM | POA: Diagnosis not present

## 2016-10-09 DIAGNOSIS — L03115 Cellulitis of right lower limb: Secondary | ICD-10-CM | POA: Diagnosis not present

## 2016-10-09 LAB — CBC WITH DIFFERENTIAL/PLATELET
BASOS ABS: 0.1 10*3/uL (ref 0.0–0.1)
Basophils Relative: 1.1 % (ref 0.0–3.0)
Eosinophils Absolute: 0.2 10*3/uL (ref 0.0–0.7)
Eosinophils Relative: 2.9 % (ref 0.0–5.0)
HCT: 38.8 % (ref 36.0–46.0)
Hemoglobin: 12.7 g/dL (ref 12.0–15.0)
LYMPHS ABS: 2.3 10*3/uL (ref 0.7–4.0)
LYMPHS PCT: 37.1 % (ref 12.0–46.0)
MCHC: 32.8 g/dL (ref 30.0–36.0)
MCV: 80.3 fl (ref 78.0–100.0)
MONOS PCT: 10.1 % (ref 3.0–12.0)
Monocytes Absolute: 0.6 10*3/uL (ref 0.1–1.0)
NEUTROS PCT: 48.8 % (ref 43.0–77.0)
Neutro Abs: 3 10*3/uL (ref 1.4–7.7)
Platelets: 221 10*3/uL (ref 150.0–400.0)
RBC: 4.83 Mil/uL (ref 3.87–5.11)
RDW: 15.4 % (ref 11.5–15.5)
WBC: 6.2 10*3/uL (ref 4.0–10.5)

## 2016-10-09 LAB — COMPREHENSIVE METABOLIC PANEL
ALBUMIN: 3.6 g/dL (ref 3.5–5.2)
ALK PHOS: 64 U/L (ref 39–117)
ALT: 7 U/L (ref 0–35)
AST: 9 U/L (ref 0–37)
BILIRUBIN TOTAL: 0.5 mg/dL (ref 0.2–1.2)
BUN: 12 mg/dL (ref 6–23)
CALCIUM: 9.6 mg/dL (ref 8.4–10.5)
CO2: 31 mEq/L (ref 19–32)
Chloride: 102 mEq/L (ref 96–112)
Creatinine, Ser: 0.57 mg/dL (ref 0.40–1.20)
GFR: 134.77 mL/min (ref >60.00)
GLUCOSE: 309 mg/dL — AB (ref 70–99)
Potassium: 3.8 mEq/L (ref 3.5–5.1)
Sodium: 139 mEq/L (ref 135–145)
TOTAL PROTEIN: 6.7 g/dL (ref 6.0–8.3)

## 2016-10-09 LAB — HEMOGLOBIN A1C: Hgb A1c MFr Bld: 12.5 % — ABNORMAL HIGH (ref 4.6–6.5)

## 2016-10-09 LAB — BRAIN NATRIURETIC PEPTIDE: Pro B Natriuretic peptide (BNP): 105 pg/mL — ABNORMAL HIGH (ref 0.0–100.0)

## 2016-10-09 MED ORDER — SULFAMETHOXAZOLE-TRIMETHOPRIM 800-160 MG PO TABS: 1.0000 | ORAL_TABLET | Freq: Two times a day (BID) | ORAL | 0 refills | Status: DC

## 2016-10-09 MED ORDER — PIOGLITAZONE HCL 30 MG PO TABS: 30.0000 mg | ORAL_TABLET | Freq: Every day | ORAL | 3 refills | Status: DC

## 2016-10-09 MED ORDER — FUROSEMIDE 40 MG PO TABS: 40.0000 mg | ORAL_TABLET | Freq: Every day | ORAL | 1 refills | Status: DC

## 2016-10-09 NOTE — Patient Instructions (Signed)
If not better in 2 days, please let me know  Cellulitis, Adult Cellulitis is a skin infection. The infected area is usually red and tender. This condition occurs most often in the arms and lower legs. The infection can travel to the muscles, blood, and underlying tissue and become serious. It is very important to get treated for this condition. What are the causes? Cellulitis is caused by bacteria. The bacteria enter through a break in the skin, such as a cut, burn, insect bite, open sore, or crack. What increases the risk? This condition is more likely to occur in people who:  Have a weak defense system (immune system).  Have open wounds on the skin such as cuts, burns, bites, and scrapes. Bacteria can enter the body through these open wounds.  Are older.  Have diabetes.  Have a type of long-lasting (chronic) liver disease (cirrhosis) or kidney disease.  Use IV drugs.  What are the signs or symptoms? Symptoms of this condition include:  Redness, streaking, or spotting on the skin.  Swollen area of the skin.  Tenderness or pain when an area of the skin is touched.  Warm skin.  Fever.  Chills.  Blisters.  How is this diagnosed? This condition is diagnosed based on a medical history and physical exam. You may also have tests, including:  Blood tests.  Lab tests.  Imaging tests.  How is this treated? Treatment for this condition may include:  Medicines, such as antibiotic medicines or antihistamines.  Supportive care, such as rest and application of cold or warm cloths (cold or warm compresses) to the skin.  Hospital care, if the condition is severe.  The infection usually gets better within 1-2 days of treatment. Follow these instructions at home:  Take over-the-counter and prescription medicines only as told by your health care provider.  If you were prescribed an antibiotic medicine, take it as told by your health care provider. Do not stop taking the  antibiotic even if you start to feel better.  Drink enough fluid to keep your urine clear or pale yellow.  Do not touch or rub the infected area.  Raise (elevate) the infected area above the level of your heart while you are sitting or lying down.  Apply warm or cold compresses to the affected area as told by your health care provider.  Keep all follow-up visits as told by your health care provider. This is important. These visits let your health care provider make sure a more serious infection is not developing. Contact a health care provider if:  You have a fever.  Your symptoms do not improve within 1-2 days of starting treatment.  Your bone or joint underneath the infected area becomes painful after the skin has healed.  Your infection returns in the same area or another area.  You notice a swollen bump in the infected area.  You develop new symptoms.  You have a general ill feeling (malaise) with muscle aches and pains. Get help right away if:  Your symptoms get worse.  You feel very sleepy.  You develop vomiting or diarrhea that persists.  You notice red streaks coming from the infected area.  Your red area gets larger or turns dark in color. This information is not intended to replace advice given to you by your health care provider. Make sure you discuss any questions you have with your health care provider. Document Released: 01/09/2005 Document Revised: 08/10/2015 Document Reviewed: 02/08/2015 Elsevier Interactive Patient Education  2017 Elsevier  Inc.  

## 2016-10-09 NOTE — Progress Notes (Signed)
Subjective:    Patient ID: Cheryl Delgado, female    DOB: 09/30/1945, 71 y.o.   MRN: [id number]  HPI This is a 71 yo female who presents today with right leg swelling x 6 days. Pain allover, but has gotten better. Slight decrease of swelling over night. Also has had blisters on right foot, they broke and area now darkened. Painful at first. No chest pain, no SOB, no palpitations. Had similar episode 8/17, was treated with antibiotic with good results.   Last HgbA1C 10.1 01/29/16.   Past Medical History:  Diagnosis Date  . Chest pain, unspecified   . Edema    peripheral  . Hypertension   . Type II or unspecified type diabetes mellitus without mention of complication, not stated as uncontrolled   . Vertigo    Past Surgical History:  Procedure Laterality Date  . COLONOSCOPY  [id number]  . dialtion and curettage    . TOTAL ABDOMINAL HYSTERECTOMY W/ BILATERAL SALPINGOOPHORECTOMY     due to fibroid tumors and uterine bleeding   Family History  Problem Relation Age of Onset  . Hyperlipidemia Mother   . Diabetes Mother   . Heart disease Mother   . Breast cancer Neg Hx   . Colon cancer Neg Hx   . Stroke Neg Hx    Social History  Substance Use Topics  . Smoking status: Current Every Day Smoker    Packs/day: 0.50    Years: 30.00    Types: Cigarettes  . Smokeless tobacco: Never Used     Comment: pt states she has smoked for a long time  . Alcohol use No      Review of Systems Per HPI    Objective:   Physical Exam  Constitutional: She is oriented to person, place, and time. She appears well-developed and well-nourished. No distress.  Obese.   HENT:  Head: Normocephalic and atraumatic.  Eyes: Conjunctivae are normal.  Cardiovascular: Normal rate.   Pulmonary/Chest: Effort normal.  Musculoskeletal: She exhibits edema.  Right lower leg with significant, non pitting edema, moderate erythema, mild tenderness to palpation.  Right foot with areas increased pigmentation  proximal to toes, some white matter and cracking between great and second toe. Areas of peeling.  Left foot unremarkable other than dysmorphic toenails.   Neurological: She is alert and oriented to person, place, and time.  Skin: Skin is warm and dry. She is not diaphoretic.  Psychiatric: She has a normal mood and affect. Her behavior is normal. Judgment and thought content normal.  Vitals reviewed.     BP (!) 148/90 (BP Location: Left Arm, Patient Position: Sitting, Cuff Size: Normal) Comment: hasnt taken BP med yet  Pulse 72   Temp 98.2 F (36.8 C) (Oral)   Resp 14   Ht 5\' 2"  (1.575 m)   Wt 210 lb (95.3 kg)   SpO2 98%   BMI 38.41 kg/m  Wt Readings from Last 3 Encounters:  10/09/16 210 lb (95.3 kg)  01/29/16 204 lb (92.5 kg)  11/15/15 205 lb (93 kg)       Assessment & Plan:  1. Cellulitis of right lower extremity - Provided written and verbal information regarding diagnosis and treatment. - elevate legs as much as possible - RTC precautions reviewed - sulfamethoxazole-trimethoprim (BACTRIM DS,SEPTRA DS) 800-160 MG tablet; Take 1 tablet by mouth 2 (two) times daily.  Dispense: 14 tablet; Refill: 0 - CBC - Comprehensive metabolic panel  2. Uncontrolled type 2 diabetes mellitus with diabetic polyneuropathy, without  long-term current use of insulin (HCC) - Comprehensive metabolic panel - Hemoglobin A1c - Ambulatory referral to Podiatry  3. Essential hypertension - CBC - Comprehensive metabolic panel - Brain natriuretic peptide  4. Lower extremity edema - chronic problem with worsening right leg secondary to cellulitis - Brain natriuretic peptide  5. Tinea pedis, right - start otc anti fungal cream to affected area - Ambulatory referral to Podiatry  - follow up to be determined from labs/ response to treatment  Clarene Reamer, FNP-BC  [facility] [place], [place]  10/09/2016 11:45 AM

## 2016-11-01 ENCOUNTER — Encounter: Payer: Self-pay | Admitting: Podiatry

## 2016-11-01 ENCOUNTER — Ambulatory Visit (INDEPENDENT_AMBULATORY_CARE_PROVIDER_SITE_OTHER): Payer: Medicare HMO | Admitting: Podiatry

## 2016-11-01 VITALS — BP 123/69 | HR 85 | Resp 16

## 2016-11-01 DIAGNOSIS — M79675 Pain in left toe(s): Secondary | ICD-10-CM | POA: Diagnosis not present

## 2016-11-01 DIAGNOSIS — B351 Tinea unguium: Secondary | ICD-10-CM | POA: Diagnosis not present

## 2016-11-01 DIAGNOSIS — M79674 Pain in right toe(s): Secondary | ICD-10-CM

## 2016-11-01 DIAGNOSIS — E1149 Type 2 diabetes mellitus with other diabetic neurological complication: Secondary | ICD-10-CM | POA: Diagnosis not present

## 2016-11-01 NOTE — Progress Notes (Signed)
   Subjective:    Patient ID: Cheryl Delgado, female    DOB: 04-21-1945, 71 y.o.   MRN: [id number]  HPI Ms. Kelli Robeck the office they for diabetic foot evaluation as well as for painful, elongated to she cannot trim herself. She denies any open sores at this time. She states that she has had cellulitis of the right side and this is much improved. She's been under the care of her primary care physician for this. She is currently not on antibiotics and she states that the symptoms of normal stability result. She does have chronic swelling to both of her feet and legs which is been ongoing since for some time and this is not new. She states this is unchanged as well. Her last A1c was 12.5. She does get some numbness and tingling to her feet. Denies any claudication symptoms today. She has no other concerns today.   Review of Systems  Cardiovascular: Positive for leg swelling.  Musculoskeletal: Positive for arthralgias.  All other systems reviewed and are negative.      Objective:   Physical Exam General: AAO x3, NAD  Dermatological: Nails are hypertrophic, dystrophic, brittle, discolored, elongated 10. No surrounding redness or drainage. Tenderness nails 1-5 bilaterally. There is some thickening the skin on the right foot from under the chronic swelling and resolving cellulitis. There is no evidence of site lightest today there is no erythema or increase in warmth. There is no fluctuance or crepitus there is no open sores. No open lesions or pre-ulcerative lesions are identified today.  Vascular: Dorsalis Pedis artery and Posterior Tibial artery pedal pulses are 2/4 bilateral with immedate capillary fill time. P There is no pain with calf compression, swelling, warmth, erythema. Chronic swelling to bilateral feet and legs.  Neruologic: Grossly intact via light touch bilateral. Vibratory intact via tuning fork bilateral. Protective threshold with Semmes Wienstein monofilament intact to all  pedal sites bilateral.   Musculoskeletal: No gross boney pedal deformities bilateral. No pain, crepitus, or limitation noted with foot and ankle range of motion bilateral. Muscular strength 5/5 in all groups tested bilateral.  Gait: Unassisted, Nonantalgic.     Assessment & Plan:  71 year old female with symptomatic onychomycosis -Treatment options discussed including all alternatives, risks, and complications -Etiology of symptoms were discussed -Nails debrided 10 without complications or bleeding. -Cellulitis appears to be almost resolved at this point. Monitor for any recurrence. -Elevation. She has compression stockings but does not wear them. Recommended start wearing these again. Continue follow up with primary care physician in regards to swelling as well. -Daily foot inspection -Follow-up in 3 months or sooner if any problems arise. In the meantime, encouraged to call the office with any questions, concerns, change in symptoms.   Celesta Gentile, DPM

## 2016-11-01 NOTE — Patient Instructions (Signed)

## 2016-12-09 ENCOUNTER — Encounter: Payer: Self-pay | Admitting: Internal Medicine

## 2017-01-27 ENCOUNTER — Other Ambulatory Visit: Payer: Self-pay | Admitting: Family Medicine

## 2017-01-31 ENCOUNTER — Ambulatory Visit: Payer: Medicare HMO | Admitting: Podiatry

## 2017-03-13 ENCOUNTER — Other Ambulatory Visit: Payer: Self-pay | Admitting: Family Medicine

## 2017-03-13 DIAGNOSIS — R69 Illness, unspecified: Secondary | ICD-10-CM | POA: Diagnosis not present

## 2017-04-22 ENCOUNTER — Other Ambulatory Visit: Payer: Self-pay | Admitting: Family Medicine

## 2017-04-29 ENCOUNTER — Telehealth: Payer: Self-pay | Admitting: Internal Medicine

## 2017-04-29 MED ORDER — METFORMIN HCL 1000 MG PO TABS: ORAL_TABLET | ORAL | 0 refills | Status: DC

## 2017-04-29 NOTE — Telephone Encounter (Addendum)
Copied from [place] [phone] [phone]. Topic: Quick Communication - Rx Refill/Question >> Apr 29, 2017  1:40 PM Scherrie Gerlach wrote: Medication: pioglitazone (ACTOS) 30 MG tablet  Pt has cpe on 05/09/2017, but does not have enough of this med to get her through. Can you send in 30 days to: your pharmacy. [facility] - [place], [address] [place] [phone] (Phone) [phone] (Fax)

## 2017-04-29 NOTE — Telephone Encounter (Signed)
OV: 10/09/16  LR: 03/17/17 #30 no refills Sent back to provider. At [place] 10/09/16 pt was to make appt based on labs.  Pt has upcoming appt 05/09/17

## 2017-04-29 NOTE — Addendum Note (Signed)
Addended by: Earnstine Regal on: 04/29/2017 03:16 PM   Modules accepted: Orders

## 2017-04-29 NOTE — Telephone Encounter (Signed)
Per office policy sent 30 day to local pharmacy until appt.../lmb  

## 2017-05-01 ENCOUNTER — Other Ambulatory Visit: Payer: Self-pay | Admitting: Internal Medicine

## 2017-05-02 ENCOUNTER — Other Ambulatory Visit: Payer: Self-pay | Admitting: Internal Medicine

## 2017-05-07 ENCOUNTER — Other Ambulatory Visit: Payer: Self-pay | Admitting: Internal Medicine

## 2017-05-09 ENCOUNTER — Telehealth: Payer: Self-pay

## 2017-05-09 ENCOUNTER — Encounter: Payer: Self-pay | Admitting: Internal Medicine

## 2017-05-09 ENCOUNTER — Ambulatory Visit (INDEPENDENT_AMBULATORY_CARE_PROVIDER_SITE_OTHER): Payer: Medicare HMO | Admitting: Internal Medicine

## 2017-05-09 VITALS — BP 122/82 | HR 74 | Temp 97.8°F | Ht 62.0 in | Wt 213.0 lb

## 2017-05-09 DIAGNOSIS — M25522 Pain in left elbow: Secondary | ICD-10-CM | POA: Diagnosis not present

## 2017-05-09 DIAGNOSIS — E1149 Type 2 diabetes mellitus with other diabetic neurological complication: Secondary | ICD-10-CM

## 2017-05-09 DIAGNOSIS — Z0000 Encounter for general adult medical examination without abnormal findings: Secondary | ICD-10-CM

## 2017-05-09 DIAGNOSIS — F17210 Nicotine dependence, cigarettes, uncomplicated: Secondary | ICD-10-CM | POA: Diagnosis not present

## 2017-05-09 DIAGNOSIS — IMO0002 Reserved for concepts with insufficient information to code with codable children: Secondary | ICD-10-CM

## 2017-05-09 DIAGNOSIS — I10 Essential (primary) hypertension: Secondary | ICD-10-CM | POA: Diagnosis not present

## 2017-05-09 DIAGNOSIS — E1165 Type 2 diabetes mellitus with hyperglycemia: Secondary | ICD-10-CM

## 2017-05-09 DIAGNOSIS — R69 Illness, unspecified: Secondary | ICD-10-CM | POA: Diagnosis not present

## 2017-05-09 LAB — POCT GLYCOSYLATED HEMOGLOBIN (HGB A1C): HEMOGLOBIN A1C: 10.5

## 2017-05-09 NOTE — Assessment & Plan Note (Signed)
HgA1c is improved from prior but still above goal. We have recommended adding jardiance today but she feels that her medications are not working because she is not taking them enough and wants to try to do better with this and return in 3 months for recheck at which time she would be willing to adjust. Foot exam done today. Reminded about eye exam.

## 2017-05-09 NOTE — Assessment & Plan Note (Signed)
BMI >42 and complicated by diabetes, hypertension and hyperlipidemia. She is aware that her weight is a significant factor in her poor health and counseled about diet and exercise changes.

## 2017-05-09 NOTE — Telephone Encounter (Signed)
Order [id number]

## 2017-05-09 NOTE — Patient Instructions (Addendum)
We will have you work on taking the medications every day until you come back in 3 months.   Work on getting the eyes checked as well.    Elbow Bursitis Rehab Ask your health care provider which exercises are safe for you. Do exercises exactly as told by your health care provider and adjust them as directed. It is normal to feel mild stretching, pulling, tightness, or discomfort as you do these exercises, but you should stop right away if you feel sudden pain or your pain gets worse. Do not begin these exercises until told by your health care provider. Stretching and range of motion exercises These exercises warm up your muscles and joints and improve the movement and flexibility of your elbow. These exercises also help to relieve pain and swelling. Exercise A: Passive elbow flexion  1. Lie on your back. 2. Extend your left / right arm up into the air, bracing it with your other hand. Allow your left / right arm to relax. 3. Let your left / right elbow bend, allowing your hand to fall slowly toward your chest. You should feel a gentle stretch along the back of your upper arm and elbow. 4. If told by your health care provider, hold a __________ hand weight to increase the intensity of this stretch. 5. Hold this position for __________ seconds. 6. Slowly return your left / right arm to the upright position. Repeat __________ times. Complete this exercise __________ times a day. Exercise B: Passive elbow extension  1. Lie on your back on a firm bed. Make sure that you are in a comfortable position that allows you relax your arm muscles. 2. Place a folded towel under your left / right upper arm so that your elbow and shoulder are at the same height. 3. Extend your left / right arm so your elbow and hand do not rest on the bed or towel. 4. Let the weight of your hand straighten your elbow. Keep your arm and chest muscles relaxed. You should feel a stretch on the inside of your elbow. 5. If told by  your health care provider, increase the intensity of your stretch by adding a small wrist weight or hand weight. 6. Hold this position for __________ seconds. 7. Slowly return to the starting position. Repeat __________ times. Complete this exercise __________ times a day. Strengthening exercises These exercises build strength and endurance in your elbow. Endurance is the ability to use your muscles for a long time, even after they get tired. Exercise C: Elbow extensors, isometric  1. Stand or sit upright on a firm surface. 2. Place your left / right arm so your palm faces your abdomen, at the height of your waist. 3. Place your other hand on the underside of your forearm. Slowly push your left / right arm down, like you were going to straighten your elbow. Resist with your other hand. Push as hard as you can without causing any pain or movement at your left / right elbow. 4. Hold this position for __________ seconds. 5. Slowly release the tension in both arms. 6. Let your muscles relax completely before repeating. Repeat __________ times. Complete this exercise __________ times a day. Exercise D: Elbow flexors, isometric 1. Stand or sit upright on a firm surface. 2. Place your left / right arm so your hand is palm-up, at the height of your waist. 3. Place your other hand on top of your forearm. Gently push up with your left / right arm, like  you were going to bend your elbow. Resist this motion with your other hand. Push as hard as you can without causing any pain or movement at your left / right elbow. 4. Hold this position for __________ seconds. 5. Slowly release the tension in both arms. 6. Let your muscles relax completely before repeating. Repeat __________ times. Complete this exercise __________ times a day. This information is not intended to replace advice given to you by your health care provider. Make sure you discuss any questions you have with your health care  provider. Document Released: 04/01/2005 Document Revised: 12/05/2015 Document Reviewed: 12/29/2014 Elsevier Interactive Patient Education  2018 [place] Maintenance, Female Adopting a healthy lifestyle and getting preventive care can go a long way to promote health and wellness. Talk with your health care provider about what schedule of regular examinations is right for you. This is a good chance for you to check in with your provider about disease prevention and staying healthy. In between checkups, there are plenty of things you can do on your own. Experts have done a lot of research about which lifestyle changes and preventive measures are most likely to keep you healthy. Ask your health care provider for more information. Weight and diet Eat a healthy diet  Be sure to include plenty of vegetables, fruits, low-fat dairy products, and lean protein.  Do not eat a lot of foods high in solid fats, added sugars, or salt.  Get regular exercise. This is one of the most important things you can do for your health. ? Most adults should exercise for at least 150 minutes each week. The exercise should increase your heart rate and make you sweat (moderate-intensity exercise). ? Most adults should also do strengthening exercises at least twice a week. This is in addition to the moderate-intensity exercise.  Maintain a healthy weight  Body mass index (BMI) is a measurement that can be used to identify possible weight problems. It estimates body fat based on height and weight. Your health care provider can help determine your BMI and help you achieve or maintain a healthy weight.  For females 49 years of age and older: ? A BMI below 18.5 is considered underweight. ? A BMI of 18.5 to 24.9 is normal. ? A BMI of 25 to 29.9 is considered overweight. ? A BMI of 30 and above is considered obese.  Watch levels of cholesterol and blood lipids  You should start having your blood tested for  lipids and cholesterol at 71 years of age, then have this test every 5 years.  You may need to have your cholesterol levels checked more often if: ? Your lipid or cholesterol levels are high. ? You are older than 71 years of age. ? You are at high risk for heart disease.  Cancer screening Lung Cancer  Lung cancer screening is recommended for adults 68-14 years old who are at high risk for lung cancer because of a history of smoking.  A yearly low-dose CT scan of the lungs is recommended for people who: ? Currently smoke. ? Have quit within the past 15 years. ? Have at least a 30-pack-year history of smoking. A pack year is smoking an average of one pack of cigarettes a day for 1 year.  Yearly screening should continue until it has been 15 years since you quit.  Yearly screening should stop if you develop a health problem that would prevent you from having lung cancer treatment.  Breast Cancer  Practice breast self-awareness. This means understanding how your breasts normally appear and feel.  It also means doing regular breast self-exams. Let your health care provider know about any changes, no matter how small.  If you are in your 20s or 30s, you should have a clinical breast exam (CBE) by a health care provider every 1-3 years as part of a regular health exam.  If you are 22 or older, have a CBE every year. Also consider having a breast X-ray (mammogram) every year.  If you have a family history of breast cancer, talk to your health care provider about genetic screening.  If you are at high risk for breast cancer, talk to your health care provider about having an MRI and a mammogram every year.  Breast cancer gene (BRCA) assessment is recommended for women who have family members with BRCA-related cancers. BRCA-related cancers include: ? Breast. ? Ovarian. ? Tubal. ? Peritoneal cancers.  Results of the assessment will determine the need for genetic counseling and BRCA1 and  BRCA2 testing.  Cervical Cancer Your health care provider may recommend that you be screened regularly for cancer of the pelvic organs (ovaries, uterus, and vagina). This screening involves a pelvic examination, including checking for microscopic changes to the surface of your cervix (Pap test). You may be encouraged to have this screening done every 3 years, beginning at age 52.  For women ages 45-65, health care providers may recommend pelvic exams and Pap testing every 3 years, or they may recommend the Pap and pelvic exam, combined with testing for human papilloma virus (HPV), every 5 years. Some types of HPV increase your risk of cervical cancer. Testing for HPV may also be done on women of any age with unclear Pap test results.  Other health care providers may not recommend any screening for nonpregnant women who are considered low risk for pelvic cancer and who do not have symptoms. Ask your health care provider if a screening pelvic exam is right for you.  If you have had past treatment for cervical cancer or a condition that could lead to cancer, you need Pap tests and screening for cancer for at least 20 years after your treatment. If Pap tests have been discontinued, your risk factors (such as having a new sexual partner) need to be reassessed to determine if screening should resume. Some women have medical problems that increase the chance of getting cervical cancer. In these cases, your health care provider may recommend more frequent screening and Pap tests.  Colorectal Cancer  This type of cancer can be detected and often prevented.  Routine colorectal cancer screening usually begins at 71 years of age and continues through 71 years of age.  Your health care provider may recommend screening at an earlier age if you have risk factors for colon cancer.  Your health care provider may also recommend using home test kits to check for hidden blood in the stool.  A small camera at the  end of a tube can be used to examine your colon directly (sigmoidoscopy or colonoscopy). This is done to check for the earliest forms of colorectal cancer.  Routine screening usually begins at age 56.  Direct examination of the colon should be repeated every 5-10 years through 71 years of age. However, you may need to be screened more often if early forms of precancerous polyps or small growths are found.  Skin Cancer  Check your skin from head to toe regularly.  Tell your health care  provider about any new moles or changes in moles, especially if there is a change in a mole's shape or color.  Also tell your health care provider if you have a mole that is larger than the size of a pencil eraser.  Always use sunscreen. Apply sunscreen liberally and repeatedly throughout the day.  Protect yourself by wearing long sleeves, pants, a wide-brimmed hat, and sunglasses whenever you are outside.  Heart disease, diabetes, and high blood pressure  High blood pressure causes heart disease and increases the risk of stroke. High blood pressure is more likely to develop in: ? People who have blood pressure in the high end of the normal range (130-139/85-89 mm Hg). ? People who are overweight or obese. ? People who are African American.  If you are 35-106 years of age, have your blood pressure checked every 3-5 years. If you are 38 years of age or older, have your blood pressure checked every year. You should have your blood pressure measured twice-once when you are at a hospital or clinic, and once when you are not at a hospital or clinic. Record the average of the two measurements. To check your blood pressure when you are not at a hospital or clinic, you can use: ? An automated blood pressure machine at a pharmacy. ? A home blood pressure monitor.  If you are between 30 years and 80 years old, ask your health care provider if you should take aspirin to prevent strokes.  Have regular diabetes  screenings. This involves taking a blood sample to check your fasting blood sugar level. ? If you are at a normal weight and have a low risk for diabetes, have this test once every three years after 71 years of age. ? If you are overweight and have a high risk for diabetes, consider being tested at a younger age or more often. Preventing infection Hepatitis B  If you have a higher risk for hepatitis B, you should be screened for this virus. You are considered at high risk for hepatitis B if: ? You were born in a country where hepatitis B is common. Ask your health care provider which countries are considered high risk. ? Your parents were born in a high-risk country, and you have not been immunized against hepatitis B (hepatitis B vaccine). ? You have HIV or AIDS. ? You use needles to inject street drugs. ? You live with someone who has hepatitis B. ? You have had sex with someone who has hepatitis B. ? You get hemodialysis treatment. ? You take certain medicines for conditions, including cancer, organ transplantation, and autoimmune conditions.  Hepatitis C  Blood testing is recommended for: ? Everyone born from [birth year] through 1964. ? Anyone with known risk factors for hepatitis C.  Sexually transmitted infections (STIs)  You should be screened for sexually transmitted infections (STIs) including gonorrhea and chlamydia if: ? You are sexually active and are younger than 71 years of age. ? You are older than 71 years of age and your health care provider tells you that you are at risk for this type of infection. ? Your sexual activity has changed since you were last screened and you are at an increased risk for chlamydia or gonorrhea. Ask your health care provider if you are at risk.  If you do not have HIV, but are at risk, it may be recommended that you take a prescription medicine daily to prevent HIV infection. This is called pre-exposure prophylaxis (PrEP). You are  considered at risk  if: ? You are sexually active and do not regularly use condoms or know the HIV status of your partner(s). ? You take drugs by injection. ? You are sexually active with a partner who has HIV.  Talk with your health care provider about whether you are at high risk of being infected with HIV. If you choose to begin PrEP, you should first be tested for HIV. You should then be tested every 3 months for as long as you are taking PrEP. Pregnancy  If you are premenopausal and you may become pregnant, ask your health care provider about preconception counseling.  If you may become pregnant, take 400 to 800 micrograms (mcg) of folic acid every day.  If you want to prevent pregnancy, talk to your health care provider about birth control (contraception). Osteoporosis and menopause  Osteoporosis is a disease in which the bones lose minerals and strength with aging. This can result in serious bone fractures. Your risk for osteoporosis can be identified using a bone density scan.  If you are 6 years of age or older, or if you are at risk for osteoporosis and fractures, ask your health care provider if you should be screened.  Ask your health care provider whether you should take a calcium or vitamin D supplement to lower your risk for osteoporosis.  Menopause may have certain physical symptoms and risks.  Hormone replacement therapy may reduce some of these symptoms and risks. Talk to your health care provider about whether hormone replacement therapy is right for you. Follow these instructions at home:  Schedule regular health, dental, and eye exams.  Stay current with your immunizations.  Do not use any tobacco products including cigarettes, chewing tobacco, or electronic cigarettes.  If you are pregnant, do not drink alcohol.  If you are breastfeeding, limit how much and how often you drink alcohol.  Limit alcohol intake to no more than 1 drink per day for nonpregnant women. One drink  equals 12 ounces of beer, 5 ounces of wine, or 1 ounces of hard liquor.  Do not use street drugs.  Do not share needles.  Ask your health care provider for help if you need support or information about quitting drugs.  Tell your health care provider if you often feel depressed.  Tell your health care provider if you have ever been abused or do not feel safe at home. This information is not intended to replace advice given to you by your health care provider. Make sure you discuss any questions you have with your health care provider. Document Released: 10/15/2010 Document Revised: 09/07/2015 Document Reviewed: 01/03/2015 Elsevier Interactive Patient Education  [employer].

## 2017-05-09 NOTE — Assessment & Plan Note (Signed)
Given some stretching exercises and advised to take tylenol. Suspect there could be a tendon etiology to the perception of weakness. If no improvement will refer to sports medicine for further evaluation. X-ray would not be helpful as there is no injury or trauma and no concerns for fracture on exam.

## 2017-05-09 NOTE — Progress Notes (Signed)
   Subjective:    Patient ID: Cheryl Delgado, female    DOB: 1945/12/08, 72 y.o.   MRN: [id number]  HPI The patient is a 72 YO female coming in for physical but is having other concerns as well which are addressed separately.   HPI #2: Here for follow up of diabetes (last HgA1c 12.5, she admits to being out of meds for "awhile" prior to that visit, is now taking her meds but not regularly, she cannot say how many per week she misses, currently is taking actos, metformin, glimepiride, taking ARB, not on statin, she has not had eye exam in about 3-4 years, denies vision changes, does have numbness in her feet at night time) and new problem of left arm pain and weakness (around her left elbow, some pain with trying to lift things, has not taken anything for pain, denies dropping anything, going on for about 1 month, overall stable but not worsening) and follow up of hypertension (taking lasix and losartan but not consistently, she has been out of losartan but started back a couple of days ago, denies headaches or chest pains).   PMH, [facility], social history reviewed and updated  Review of Systems  Constitutional: Negative.   HENT: Negative.   Eyes: Negative.   Respiratory: Negative for cough, chest tightness and shortness of breath.   Cardiovascular: Negative for chest pain, palpitations and leg swelling.  Gastrointestinal: Negative for abdominal distention, abdominal pain, constipation, diarrhea, nausea and vomiting.  Musculoskeletal: Positive for arthralgias. Negative for joint swelling, myalgias, neck pain and neck stiffness.  Skin: Negative.   Neurological: Positive for numbness. Negative for dizziness, tremors, speech difficulty and weakness.  Psychiatric/Behavioral: Negative.       Objective:   Physical Exam  Constitutional: She is oriented to person, place, and time. She appears well-developed and well-nourished.  HENT:  Head: Normocephalic and atraumatic.  Eyes: EOM are normal.  Neck:  Normal range of motion.  Cardiovascular: Normal rate and regular rhythm.  Pulmonary/Chest: Effort normal and breath sounds normal. No respiratory distress. She has no wheezes. She has no rales.  Abdominal: Soft. Bowel sounds are normal. She exhibits no distension. There is no tenderness. There is no rebound.  Musculoskeletal: She exhibits tenderness. She exhibits no edema.  Some tenderness in the left elbow, no signs of tendon tear or muscle atrophy, strength in the arm and hand equal to the right side.   Neurological: She is alert and oriented to person, place, and time. Coordination normal.  Skin: Skin is warm and dry.  Psychiatric: She has a normal mood and affect.   Vitals:   05/09/17 0849 05/09/17 0917  BP: (!) 142/82 122/82  Pulse: 74   Temp: 97.8 F (36.6 C)   TempSrc: Oral   SpO2: 99%   Weight: 213 lb (96.6 kg)   Height: 5\' 2"  (1.575 m)       Assessment & Plan:

## 2017-05-09 NOTE — Assessment & Plan Note (Signed)
BP okay on recheck on her losartan and lasix. Recent CMP without indication for change. Check BMP at next visit.

## 2017-05-09 NOTE — Assessment & Plan Note (Signed)
Time spent counseling about tobacco usage: 3 minutes. I have asked about smoking and is smoking same as usual. The patient is advised to quit. The patient is not willing to quit. They would like to try to quit in the next 6 months. We will follow up with them in 6 months. 

## 2017-05-09 NOTE — Assessment & Plan Note (Signed)
Ordered cologuard, Declines pneumonia vaccine. Flu shot up to date. Tetanus up to date. Declines mammogram and dexa at this time. Counseled about sun safety and mole surveillance. Given 10 year screening recommendations.

## 2017-06-25 DIAGNOSIS — Z1211 Encounter for screening for malignant neoplasm of colon: Secondary | ICD-10-CM | POA: Diagnosis not present

## 2017-06-25 DIAGNOSIS — Z1212 Encounter for screening for malignant neoplasm of rectum: Secondary | ICD-10-CM | POA: Diagnosis not present

## 2017-06-30 ENCOUNTER — Other Ambulatory Visit: Payer: Self-pay | Admitting: Internal Medicine

## 2017-07-04 LAB — COLOGUARD: Cologuard: NEGATIVE

## 2017-08-07 ENCOUNTER — Encounter: Payer: Self-pay | Admitting: Internal Medicine

## 2017-08-07 ENCOUNTER — Other Ambulatory Visit (INDEPENDENT_AMBULATORY_CARE_PROVIDER_SITE_OTHER): Payer: Medicare HMO

## 2017-08-07 ENCOUNTER — Ambulatory Visit (INDEPENDENT_AMBULATORY_CARE_PROVIDER_SITE_OTHER): Payer: Medicare HMO | Admitting: Internal Medicine

## 2017-08-07 VITALS — BP 138/92 | HR 77 | Temp 98.6°F | Ht 62.0 in | Wt 222.0 lb

## 2017-08-07 DIAGNOSIS — IMO0002 Reserved for concepts with insufficient information to code with codable children: Secondary | ICD-10-CM

## 2017-08-07 DIAGNOSIS — E1149 Type 2 diabetes mellitus with other diabetic neurological complication: Secondary | ICD-10-CM

## 2017-08-07 DIAGNOSIS — E1165 Type 2 diabetes mellitus with hyperglycemia: Secondary | ICD-10-CM

## 2017-08-07 LAB — COMPREHENSIVE METABOLIC PANEL
ALBUMIN: 3.7 g/dL (ref 3.5–5.2)
ALK PHOS: 62 U/L (ref 39–117)
ALT: 5 U/L (ref 0–35)
AST: 9 U/L (ref 0–37)
BUN: 16 mg/dL (ref 6–23)
CALCIUM: 9.4 mg/dL (ref 8.4–10.5)
CO2: 33 mEq/L — ABNORMAL HIGH (ref 19–32)
Chloride: 101 mEq/L (ref 96–112)
Creatinine, Ser: 0.61 mg/dL (ref 0.40–1.20)
GFR: 124.33 mL/min (ref >60.00)
Glucose, Bld: 177 mg/dL — ABNORMAL HIGH (ref 70–99)
POTASSIUM: 4.1 meq/L (ref 3.5–5.1)
Sodium: 140 mEq/L (ref 135–145)
TOTAL PROTEIN: 6.8 g/dL (ref 6.0–8.3)
Total Bilirubin: 0.4 mg/dL (ref 0.2–1.2)

## 2017-08-07 LAB — HEMOGLOBIN A1C: HEMOGLOBIN A1C: 10 % — AB (ref 4.6–6.5)

## 2017-08-07 MED ORDER — METFORMIN HCL 1000 MG PO TABS: ORAL_TABLET | ORAL | 0 refills | Status: DC

## 2017-08-07 NOTE — Assessment & Plan Note (Signed)
Checking HgA1c today and adjust as needed. Still not taking metformin BID and asked her to work on this. Taking glimepiride and metformin and actos. Adjust as needed. Complicated and stable. Poorly controlled for some time making her moderately severe.

## 2017-08-07 NOTE — Progress Notes (Signed)
   Subjective:    Patient ID: Cheryl Delgado, female    DOB: Jul 18, 1945, 72 y.o.   MRN: [id number]  HPI The patient is a 72 YO female coming in for follow up of her uncontrolled diabetes. She has been working on be more diligent about taking meds. Per chart review has filled 90 day supplies of all meds except metformin within the last 90 days. Metformin has only 2 months of meds filled in the last 3 months. She admits to doing better. She has not felt a huge different in her overall health. Still having some soreness in her left arm as well as both legs. Has not tried anything for that. Arm overall improving. Still smoking and no intention to quit right now.   Review of Systems  Constitutional: Negative.   HENT: Negative.   Eyes: Negative.   Respiratory: Negative for cough, chest tightness and shortness of breath.   Cardiovascular: Negative for chest pain, palpitations and leg swelling.  Gastrointestinal: Negative for abdominal distention, abdominal pain, constipation, diarrhea, nausea and vomiting.  Musculoskeletal: Positive for arthralgias and myalgias.  Skin: Negative.   Neurological: Negative.   Psychiatric/Behavioral: Negative.       Objective:   Physical Exam  Constitutional: She is oriented to person, place, and time. She appears well-developed and well-nourished.  HENT:  Head: Normocephalic and atraumatic.  Eyes: EOM are normal.  Neck: Normal range of motion.  Cardiovascular: Normal rate and regular rhythm.  Pulmonary/Chest: Effort normal and breath sounds normal. No respiratory distress. She has no wheezes. She has no rales.  Abdominal: Soft.  Musculoskeletal: She exhibits no edema.  Neurological: She is alert and oriented to person, place, and time. Coordination normal.  Skin: Skin is warm and dry.   Vitals:   08/07/17 0948  BP: (!) 138/92  Pulse: 77  Temp: 98.6 F (37 C)  TempSrc: Oral  SpO2: 95%  Weight: 222 lb (100.7 kg)  Height: 5\' 2"  (1.575 m)        Assessment & Plan:

## 2017-08-07 NOTE — Patient Instructions (Signed)
We are checking the labs today.   You can try taking tylenol for the pain in the arm and let us know if this worsens.    Knee Exercises Ask your health care provider which exercises are safe for you. Do exercises exactly as told by your health care provider and adjust them as directed. It is normal to feel mild stretching, pulling, tightness, or discomfort as you do these exercises, but you should stop right away if you feel sudden pain or your pain gets worse.Do not begin these exercises until told by your health care provider. STRETCHING AND RANGE OF MOTION EXERCISES These exercises warm up your muscles and joints and improve the movement and flexibility of your knee. These exercises also help to relieve pain, numbness, and tingling. Exercise A: Knee Extension, Prone 1. Lie on your abdomen on a bed. 2. Place your left / right knee just beyond the edge of the surface so your knee is not on the bed. You can put a towel under your left / right thigh just above your knee for comfort. 3. Relax your leg muscles and allow gravity to straighten your knee. You should feel a stretch behind your left / right knee. 4. Hold this position for __________ seconds. 5. Scoot up so your knee is supported between repetitions. Repeat __________ times. Complete this stretch __________ times a day. Exercise B: Knee Flexion, Active  1. Lie on your back with both knees straight. If this causes back discomfort, bend your left / right knee so your foot is flat on the floor. 2. Slowly slide your left / right heel back toward your buttocks until you feel a gentle stretch in the front of your knee or thigh. 3. Hold this position for __________ seconds. 4. Slowly slide your left / right heel back to the starting position. Repeat __________ times. Complete this exercise __________ times a day. Exercise C: Quadriceps, Prone  1. Lie on your abdomen on a firm surface, such as a bed or padded floor. 2. Bend your left /  right knee and hold your ankle. If you cannot reach your ankle or pant leg, loop a belt around your foot and grab the belt instead. 3. Gently pull your heel toward your buttocks. Your knee should not slide out to the side. You should feel a stretch in the front of your thigh and knee. 4. Hold this position for __________ seconds. Repeat __________ times. Complete this stretch __________ times a day. Exercise D: Hamstring, Supine 1. Lie on your back. 2. Loop a belt or towel over the ball of your left / right foot. The ball of your foot is on the walking surface, right under your toes. 3. Straighten your left / right knee and slowly pull on the belt to raise your leg until you feel a gentle stretch behind your knee. ? Do not let your left / right knee bend while you do this. ? Keep your other leg flat on the floor. 4. Hold this position for __________ seconds. Repeat __________ times. Complete this stretch __________ times a day. STRENGTHENING EXERCISES These exercises build strength and endurance in your knee. Endurance is the ability to use your muscles for a long time, even after they get tired. Exercise E: Quadriceps, Isometric  1. Lie on your back with your left / right leg extended and your other knee bent. Put a rolled towel or small pillow under your knee if told by your health care provider. 2. Slowly tense the  muscles in the front of your left / right thigh. You should see your kneecap slide up toward your hip or see increased dimpling just above the knee. This motion will push the back of the knee toward the floor. 3. For __________ seconds, keep the muscle as tight as you can without increasing your pain. 4. Relax the muscles slowly and completely. Repeat __________ times. Complete this exercise __________ times a day. Exercise F: Straight Leg Raises - Quadriceps 1. Lie on your back with your left / right leg extended and your other knee bent. 2. Tense the muscles in the front of  your left / right thigh. You should see your kneecap slide up or see increased dimpling just above the knee. Your thigh may even shake a bit. 3. Keep these muscles tight as you raise your leg 4-6 inches (10-15 cm) off the floor. Do not let your knee bend. 4. Hold this position for __________ seconds. 5. Keep these muscles tense as you lower your leg. 6. Relax your muscles slowly and completely after each repetition. Repeat __________ times. Complete this exercise __________ times a day. Exercise G: Hamstring, Isometric 1. Lie on your back on a firm surface. 2. Bend your left / right knee approximately __________ degrees. 3. Dig your left / right heel into the surface as if you are trying to pull it toward your buttocks. Tighten the muscles in the back of your thighs to dig as hard as you can without increasing any pain. 4. Hold this position for __________ seconds. 5. Release the tension gradually and allow your muscles to relax completely for __________ seconds after each repetition. Repeat __________ times. Complete this exercise __________ times a day. Exercise H: Hamstring Curls  If told by your health care provider, do this exercise while wearing ankle weights. Begin with __________ weights. Then increase the weight by 1 lb (0.5 kg) increments. Do not wear ankle weights that are more than __________. 1. Lie on your abdomen with your legs straight. 2. Bend your left / right knee as far as you can without feeling pain. Keep your hips flat against the floor. 3. Hold this position for __________ seconds. 4. Slowly lower your leg to the starting position.  Repeat __________ times. Complete this exercise __________ times a day. Exercise I: Squats (Quadriceps) 1. Stand in front of a table, with your feet and knees pointing straight ahead. You may rest your hands on the table for balance but not for support. 2. Slowly bend your knees and lower your hips like you are going to sit in a  chair. ? Keep your weight over your heels, not over your toes. ? Keep your lower legs upright so they are parallel with the table legs. ? Do not let your hips go lower than your knees. ? Do not bend lower than told by your health care provider. ? If your knee pain increases, do not bend as low. 3. Hold the squat position for __________ seconds. 4. Slowly push with your legs to return to standing. Do not use your hands to pull yourself to standing. Repeat __________ times. Complete this exercise __________ times a day. Exercise J: Wall Slides (Quadriceps)  1. Lean your back against a smooth wall or door while you walk your feet out 18-24 inches (46-61 cm) from it. 2. Place your feet hip-width apart. 3. Slowly slide down the wall or door until your knees bend __________ degrees. Keep your knees over your heels, not over your toes.  Keep your knees in line with your hips. 4. Hold for __________ seconds. Repeat __________ times. Complete this exercise __________ times a day. Exercise K: Straight Leg Raises - Hip Abductors 1. Lie on your side with your left / right leg in the top position. Lie so your head, shoulder, knee, and hip line up. You may bend your bottom knee to help you keep your balance. 2. Roll your hips slightly forward so your hips are stacked directly over each other and your left / right knee is facing forward. 3. Leading with your heel, lift your top leg 4-6 inches (10-15 cm). You should feel the muscles in your outer hip lifting. ? Do not let your foot drift forward. ? Do not let your knee roll toward the ceiling. 4. Hold this position for __________ seconds. 5. Slowly return your leg to the starting position. 6. Let your muscles relax completely after each repetition. Repeat __________ times. Complete this exercise __________ times a day. Exercise L: Straight Leg Raises - Hip Extensors 1. Lie on your abdomen on a firm surface. You can put a pillow under your hips if that is  more comfortable. 2. Tense the muscles in your buttocks and lift your left / right leg about 4-6 inches (10-15 cm). Keep your knee straight as you lift your leg. 3. Hold this position for __________ seconds. 4. Slowly lower your leg to the starting position. 5. Let your leg relax completely after each repetition. Repeat __________ times. Complete this exercise __________ times a day. This information is not intended to replace advice given to you by your health care provider. Make sure you discuss any questions you have with your health care provider. Document Released: 02/13/2005 Document Revised: 12/25/2015 Document Reviewed: 02/05/2015 Elsevier Interactive Patient Education  2018 [employer].

## 2017-08-13 ENCOUNTER — Other Ambulatory Visit: Payer: Self-pay | Admitting: Internal Medicine

## 2017-08-26 ENCOUNTER — Other Ambulatory Visit: Payer: Self-pay | Admitting: Internal Medicine

## 2017-08-27 DIAGNOSIS — H52203 Unspecified astigmatism, bilateral: Secondary | ICD-10-CM | POA: Diagnosis not present

## 2017-08-27 DIAGNOSIS — E103411 Type 1 diabetes mellitus with severe nonproliferative diabetic retinopathy with macular edema, right eye: Secondary | ICD-10-CM | POA: Diagnosis not present

## 2017-08-27 LAB — HM DIABETES EYE EXAM

## 2017-08-28 ENCOUNTER — Encounter: Payer: Self-pay | Admitting: Internal Medicine

## 2017-08-28 NOTE — Progress Notes (Signed)
Abstracted and sent to scan  

## 2017-09-04 DIAGNOSIS — E113292 Type 2 diabetes mellitus with mild nonproliferative diabetic retinopathy without macular edema, left eye: Secondary | ICD-10-CM | POA: Diagnosis not present

## 2017-09-04 DIAGNOSIS — H3341 Traction detachment of retina, right eye: Secondary | ICD-10-CM | POA: Diagnosis not present

## 2017-09-09 ENCOUNTER — Encounter (HOSPITAL_COMMUNITY): Payer: Self-pay | Admitting: Emergency Medicine

## 2017-09-09 ENCOUNTER — Ambulatory Visit (HOSPITAL_COMMUNITY)
Admission: EM | Admit: 2017-09-09 | Discharge: 2017-09-09 | Disposition: A | Discharge status: Home / Self Care | Payer: Medicare HMO | Attending: Internal Medicine | Admitting: Internal Medicine

## 2017-09-09 ENCOUNTER — Ambulatory Visit: Payer: Self-pay | Admitting: Internal Medicine

## 2017-09-09 DIAGNOSIS — L03115 Cellulitis of right lower limb: Secondary | ICD-10-CM | POA: Diagnosis not present

## 2017-09-09 DIAGNOSIS — B353 Tinea pedis: Secondary | ICD-10-CM

## 2017-09-09 DIAGNOSIS — D3121 Benign neoplasm of right retina: Secondary | ICD-10-CM | POA: Diagnosis not present

## 2017-09-09 MED ORDER — CEPHALEXIN 500 MG PO CAPS: 500.0000 mg | ORAL_CAPSULE | Freq: Two times a day (BID) | ORAL | 0 refills | Status: DC

## 2017-09-09 MED ORDER — KETOCONAZOLE 2 % EX CREA: 1.0000 "application " | TOPICAL_CREAM | Freq: Every day | CUTANEOUS | 0 refills | Status: DC

## 2017-09-09 NOTE — ED Provider Notes (Signed)
[name]    CSN: [id number] Arrival date & time: 09/09/17  1441     History   Chief Complaint Chief Complaint  Patient presents with  . Cellulitis    HPI Cheryl Delgado is a 72 y.o. female.   She presents today with acute increase in right leg swelling, with redness and pain in the last couple of days.  This is superimposed on chronic bilateral leg swelling.  No fever, no malaise.    HPI  Past Medical History:  Diagnosis Date  . Chest pain, unspecified   . Edema    peripheral  . Hypertension   . Type II or unspecified type diabetes mellitus without mention of complication, not stated as uncontrolled   . Vertigo     Patient Active Problem List   Diagnosis Date Noted  . Left elbow pain 05/09/2017  . Smoking 1/2 pack a day or less 09/22/2012  . Morbid obesity ([place]) 10/17/2010  . Routine general medical examination at a health care facility 10/17/2010  . Uncontrolled diabetes mellitus with neurologic complication, without long-term current use of insulin ([place]) 08/15/2007  . Essential hypertension 08/15/2007  . Chronic venous insufficiency 08/15/2007  . VERTIGO, HX OF 08/15/2007    Past Surgical History:  Procedure Laterality Date  . COLONOSCOPY  [id number]  . dialtion and curettage    . TOTAL ABDOMINAL HYSTERECTOMY W/ BILATERAL SALPINGOOPHORECTOMY     due to fibroid tumors and uterine bleeding     Home Medications    Prior to Admission medications   Medication Sig Start Date End Date Taking? Authorizing Provider  cephALEXin (KEFLEX) 500 MG capsule Take 1 capsule (500 mg total) by mouth 2 (two) times daily. 09/09/17   Wynona Luna, MD  furosemide (LASIX) 40 MG tablet TAKE 1 TABLET BY MOUTH DAILY 08/27/17   Hoyt Koch, MD  glimepiride (AMARYL) 4 MG tablet TAKE 1 TABLET(4 MG) BY MOUTH DAILY BEFORE BREAKFAST 08/13/17   Hoyt Koch, MD  ketoconazole (NIZORAL) 2 % cream Apply 1 application topically daily. Rub in well especially  between toes 09/09/17   Wynona Luna, MD  losartan (COZAAR) 50 MG tablet TAKE 1 TABLET(50 MG) BY MOUTH DAILY 07/01/17   Hoyt Koch, MD  metFORMIN (GLUCOPHAGE) 1000 MG tablet TAKE 1 TABLET BY MOUTH TWICE DAILY WITH MEALS. NEEDS OFFICE VISIT. 08/07/17   Hoyt Koch, MD  pioglitazone (ACTOS) 30 MG tablet TAKE 1 TABLET(30 MG) BY MOUTH DAILY 05/02/17   Hoyt Koch, MD    Family History Family History  Problem Relation Age of Onset  . Hyperlipidemia Mother   . Diabetes Mother   . Heart disease Mother   . Breast cancer Neg Hx   . Colon cancer Neg Hx   . Stroke Neg Hx     Social History Social History   Tobacco Use  . Smoking status: Current Every Day Smoker    Packs/day: 0.50    Years: 30.00    Pack years: 15.00    Types: Cigarettes  . Smokeless tobacco: Never Used  . Tobacco comment: pt states she has smoked for a long time  Substance Use Topics  . Alcohol use: No  . Drug use: No     Allergies   Patient has no known allergies.   Review of Systems Review of Systems  All other systems reviewed and are negative.    Physical Exam Triage Vital Signs ED Triage Vitals [09/09/17 1505]  Enc Vitals Group  BP (!) 142/63     Pulse Rate 87     Resp 16     Temp 98.4 F (36.9 C)     Temp src      SpO2 98 %     Weight 220 lb (99.8 kg)     Height      Pain Score 6     Pain Loc    Updated Vital Signs BP (!) 142/63   Pulse 87   Temp 98.4 F (36.9 C)   Resp 16   Wt 220 lb (99.8 kg)   SpO2 98%   BMI 40.24 kg/m  Physical Exam  Constitutional: She is oriented to person, place, and time. No distress.  HENT:  Head: Atraumatic.  Eyes:  Conjugate gaze observed, no eye redness/discharge  Neck: Neck supple.  Cardiovascular: Normal rate.  Pulmonary/Chest: No respiratory distress.  Abdominal: She exhibits no distension.  Musculoskeletal: Normal range of motion.  Right leg with 4+ pitting edema and bright erythema/warmth to the knee,  with mild brawniness also present.  The left leg has 3+ pitting edema to the knee.  There is maceration and fissuring between the toes of the right foot.  Neurological: She is alert and oriented to person, place, and time.  Skin: Skin is warm and dry.  Nursing note and vitals reviewed.    UC Treatments / Results  Labs (all labs ordered are listed, but only abnormal results are displayed) Labs Reviewed - No data to display  EKG None  Radiology No results found.  Procedures Procedures (including critical care time)  Medications Ordered in UC Medications - No data to display  Final Clinical Impressions(s) / UC Diagnoses   Final diagnoses:  Cellulitis of right lower extremity  Tinea pedis of right foot     Discharge Instructions     Right leg swelling/redness seems most likely due to infection.  Prescriptions for cephalexin (antibiotic) and ketoconazole cream (for foot fungus between toes) were sent to the pharmacy.  Apply compression stocking to right leg every morning when you get up and leave on until bed time, to help with swelling/pain.  It will also help to prop leg up above heart level for 5-10 minutes several times daily.  Anticipate gradual improvement over the next several days.  Recheck or followup with you primary care provider if not improving as expected.  An ultrasound of the leg, to check for possible blood clot, has been ordered.       ED Prescriptions    Medication Sig Dispense Auth. Provider   cephALEXin (KEFLEX) 500 MG capsule Take 1 capsule (500 mg total) by mouth 2 (two) times daily. 20 capsule Wynona Luna, MD   ketoconazole (NIZORAL) 2 % cream Apply 1 application topically daily. Rub in well especially between toes 60 g Wynona Luna, MD        Wynona Luna, MD 09/13/17 [phone]

## 2017-09-09 NOTE — Discharge Instructions (Addendum)
Right leg swelling/redness seems most likely due to infection.  Prescriptions for cephalexin (antibiotic) and ketoconazole cream (for foot fungus between toes) were sent to the pharmacy.  Apply compression stocking to right leg every morning when you get up and leave on until bed time, to help with swelling/pain.  It will also help to prop leg up above heart level for 5-10 minutes several times daily.  Anticipate gradual improvement over the next several days.  Recheck or followup with you primary care provider if not improving as expected.  An ultrasound of the leg, to check for possible blood clot, has been ordered.

## 2017-09-09 NOTE — Telephone Encounter (Signed)
Pt reports severe swelling right leg from foot to knee, onset Sunday. States leg is "Tight",  no weeping.  Area is red and warm to touch. Reports 7-8/10  pain, constant. H/O cellulitis right leg 1 year ago. Pt reports has elevated leg "Doesn't help" and when lowering leg, pain is 10/10. Denies SOB, CP, does not recall any recent injury. Directed pt to ED. Pt states she will go to UC. Care advise given per protocol.  Reason for Disposition . [1] Red area or streak [2] large (> 2 in. or 5 cm)  Answer Assessment - Initial Assessment Questions 1. ONSET: "When did the swelling start?" (e.g., minutes, hours, days)     Sunday 2. LOCATION: "What part of the leg is swollen?"  "Are both legs swollen or just one leg?"     From foot to knee, right leg. :Tight" 3. SEVERITY: "How bad is the swelling?" (e.g., localized; mild, moderate, severe)  - Localized - small area of swelling localized to one leg  - MILD pedal edema - swelling limited to foot and ankle, pitting edema < 1/4 inch (6 mm) deep, rest and elevation eliminate most or all swelling  - MODERATE edema - swelling of lower leg to knee, pitting edema > 1/4 inch (6 mm) deep, rest and elevation only partially reduce swelling  - SEVERE edema - swelling extends above knee, facial or hand swelling present     Moderate "Tight" 4. REDNESS: "Does the swelling look red or infected?"     Red and warm 5. PAIN: "Is the swelling painful to touch?" If so, ask: "How painful is it?"   (Scale 1-10; mild, moderate or severe)     7-8/10; 10/10 when lowers leg. 6. FEVER: "Do you have a fever?" If so, ask: "What is it, how was it measured, and when did it start?"     no 7. CAUSE: "What do you think is causing the leg swelling?"     unsure 8. MEDICAL HISTORY: "Do you have a history of heart failure, kidney disease, liver failure, or cancer?"     No. DM. H/O cellulitis 9. RECURRENT SYMPTOM: "Have you had leg swelling before?" If so, ask: "When was the last time?" "What  happened that time?"     1 year ago, cellulitis right leg 10. OTHER SYMPTOMS: "Do you have any other symptoms?" (e.g., chest pain, difficulty breathing)       no  Protocols used: LEG SWELLING AND EDEMA-A-AH

## 2017-09-09 NOTE — Telephone Encounter (Signed)
Fyi.

## 2017-09-09 NOTE — ED Triage Notes (Signed)
PT has chronic leg swelling. Right leg has worsened, is red and warm to touch.

## 2017-09-11 ENCOUNTER — Ambulatory Visit (HOSPITAL_COMMUNITY): Admission: RE | Admit: 2017-09-11 | Payer: Medicare HMO | Source: Ambulatory Visit

## 2017-10-01 ENCOUNTER — Other Ambulatory Visit: Payer: Self-pay | Admitting: Internal Medicine

## 2017-10-07 DIAGNOSIS — H35371 Puckering of macula, right eye: Secondary | ICD-10-CM | POA: Diagnosis not present

## 2017-10-07 DIAGNOSIS — H3581 Retinal edema: Secondary | ICD-10-CM | POA: Diagnosis not present

## 2017-10-07 DIAGNOSIS — H2513 Age-related nuclear cataract, bilateral: Secondary | ICD-10-CM | POA: Diagnosis not present

## 2017-10-22 NOTE — Progress Notes (Addendum)
[facility] Note  10/23/2017     CHIEF COMPLAINT Patient presents for Retina Evaluation   HISTORY OF PRESENT ILLNESS: Cheryl Delgado is a 72 y.o. female who presents to the clinic today for:   HPI    Retina Evaluation    In right eye.  Duration of 2 weeks.  Context:  distance vision, mid-range vision and near vision.  Treatments tried include no treatments.  I, the attending physician,  performed the HPI with the patient and updated documentation appropriately.          Comments    Pt presents on the referral of Dr. Dwana Melena for retinal eval and FA, pt states this was her first visit with Dr. Manuella Ghazi, her Dr. Ricki Miller sent her to him for a second opinion, pt states she is experiencing blurry vision, but denies flashes, floaters, pain and wavy vision, pt denies the use of gtts, pts last A1C was 8.0 in May, pt does not check BS at home right now,        Last edited by Bernarda Caffey, MD on 10/23/2017  9:34 AM. (History)    Pt states she began seeing Dr. Ricki Miller "a few months ago"; Pt states she began seeing her due to having blurred VA OD x 6-7 months ago; Pt states she was seen by Dr. Manuella Ghazi and states he wanted a FA; Pt states she was told her "retina was detached and that one of the veins might have gotten clogged and then sprouted tentacles that lighted my retina"; Pt endorses dx of DM; Pt denies having any systemic disease other than DM, denies and skin lesions, denies any rashes; Pt denies having any family hx of any ocular diseases;   Referring physician: Feliz Beam, MD [place], [place] [id number]  HISTORICAL INFORMATION:   Selected notes from the MEDICAL RECORD NUMBER Referred by Dr. Dwana Melena for FA LEE: 06.25.19 Matilde Bash) [[facility]: OD: 20/100-2 OS: 20/25-2] Ocular Hx-retinal edema, macular pucker, cataract OU,  PMH-DM (taking Metformin and glimepiride)    CURRENT MEDICATIONS: No current outpatient medications on file.  (Ophthalmic Drugs)   No current facility-administered medications for this visit.  (Ophthalmic Drugs)   Current Outpatient Medications (Other)  Medication Sig  . cephALEXin (KEFLEX) 500 MG capsule Take 1 capsule (500 mg total) by mouth 2 (two) times daily.  . furosemide (LASIX) 40 MG tablet TAKE 1 TABLET BY MOUTH DAILY  . glimepiride (AMARYL) 4 MG tablet TAKE 1 TABLET(4 MG) BY MOUTH DAILY BEFORE BREAKFAST  . ketoconazole (NIZORAL) 2 % cream Apply 1 application topically daily. Rub in well especially between toes  . losartan (COZAAR) 50 MG tablet TAKE 1 TABLET(50 MG) BY MOUTH DAILY  . metFORMIN (GLUCOPHAGE) 1000 MG tablet TAKE 1 TABLET BY MOUTH TWICE DAILY WITH MEALS. NEEDS OFFICE VISIT.  Marland Kitchen pioglitazone (ACTOS) 30 MG tablet TAKE 1 TABLET(30 MG) BY MOUTH DAILY   No current facility-administered medications for this visit.  (Other)      REVIEW OF SYSTEMS:    ALLERGIES No Known Allergies  PAST MEDICAL HISTORY Past Medical History:  Diagnosis Date  . Chest pain, unspecified   . Edema    peripheral  . Hypertension   . Type II or unspecified type diabetes mellitus without mention of complication, not stated as uncontrolled   . Vertigo    Past Surgical History:  Procedure Laterality Date  . COLONOSCOPY  [id number]  . dialtion and curettage    .  TOTAL ABDOMINAL HYSTERECTOMY W/ BILATERAL SALPINGOOPHORECTOMY     due to fibroid tumors and uterine bleeding    FAMILY HISTORY Family History  Problem Relation Age of Onset  . Hyperlipidemia Mother   . Diabetes Mother   . Heart disease Mother   . Breast cancer Neg Hx   . Colon cancer Neg Hx   . Stroke Neg Hx   . Amblyopia Neg Hx   . Blindness Neg Hx   . Cataracts Neg Hx   . Glaucoma Neg Hx   . Macular degeneration Neg Hx   . Retinal detachment Neg Hx   . Strabismus Neg Hx   . Retinitis pigmentosa Neg Hx     SOCIAL HISTORY Social History   Tobacco Use  . Smoking status: Current Every Day Smoker    Packs/day: 0.50     Years: 30.00    Pack years: 15.00    Types: Cigarettes  . Smokeless tobacco: Never Used  . Tobacco comment: pt states she has smoked for a long time  Substance Use Topics  . Alcohol use: No  . Drug use: No         OPHTHALMIC EXAM:  Base Eye Exam    Visual Acuity (Snellen - Linear)      Right Left   Dist cc 20/150 -2 20/25 -2   Dist ph cc 20/150 -1 20/25 -2   Correction:  Glasses       Tonometry (Tonopen, 8:24 AM)      Right Left   Pressure 14 14       Pupils      Dark Light Shape React APD   Right 3 2 Round Brisk None   Left 3 2 Round Brisk None       Visual Fields (Counting fingers)      Left Right    Full Full       Extraocular Movement      Right Left    Full, Ortho Full, Ortho       Neuro/Psych    Oriented x3:  Yes   Mood/Affect:  Normal       Dilation    Both eyes:  1.0% Mydriacyl, 2.5% Phenylephrine @ 8:24 AM        Slit Lamp and Fundus Exam    Slit Lamp Exam      Right Left   Lids/Lashes Dermatochalasis - upper lid Dermatochalasis - upper lid   Conjunctiva/Sclera Mild Melanosis Mild Melanosis   Cornea 1+ Punctate epithelial erosions, Arcus, Anterior basement membrane dystrophy 2+ Punctate epithelial erosions, Arcus   Anterior Chamber Moderate depth Moderate depth   Iris Round and dilated, No NVI Round and dilated, No NVI   Lens 2+ Nuclear sclerosis, 2+ Cortical cataract, Vacuoles 2+ Nuclear sclerosis, 3+ Cortical cataract, Vacuoles   Vitreous Mild Vitreous syneresis, posterior traction, vitreous opacities inferiorly Mild Vitreous syneresis       Fundus Exam      Right Left   Disc obsured margin with peripapillary fibrosis and traction, vascular loops Pink and Sharp   C/D Ratio  0.2   Macula Blunted foveal reflex, central cystic changes, Epiretinal membrane, temporal Exudates, IRH Flat, No heme or edema   Vessels Vascular attenuation, Tortuous, severe peripheral capillary drop out Mild Vascular attenuation   Periphery Attached, ST  hemangioblastoma Attached, scattered punctate RPE changes, scattered capillar drop out, early hemangioblastoma temporally        Refraction    Wearing Rx      Sphere Cylinder  Axis Add   Right -2.50 +4.00 032 +2.25   Left -3.25 +2.50 168 +2.25       Manifest Refraction      Sphere Cylinder Axis Dist VA   Right -3.50 +5.00 032 20/150   Left -1.25 +3.50 168 20/25          IMAGING AND PROCEDURES  Imaging and Procedures for @TODAY @  OCT, Retina - OU - Both Eyes       Right Eye Quality was good. Central Foveal Thickness: 438. Progression has no prior data. Findings include intraretinal fluid, abnormal foveal contour, vitreous traction, epiretinal membrane, preretinal fibrosis.   Left Eye Quality was good. Central Foveal Thickness: 217. Progression has no prior data. Findings include normal foveal contour, no IRF, no SRF (Partial PVD).   Notes *Images captured and stored on drive  Diagnosis / Impression:  OD: macular edema with vitreous traction and pre-retinal fibrosis OD: NFP, No IRF/SRF  Clinical management:  See below  Abbreviations: NFP - Normal foveal profile. CME - cystoid macular edema. PED - pigment epithelial detachment. IRF - intraretinal fluid. SRF - subretinal fluid. EZ - ellipsoid zone. ERM - epiretinal membrane. ORA - outer retinal atrophy. ORT - outer retinal tubulation. SRHM - subretinal hyper-reflective material         Fluorescein Angiography Optos (Transit OD)       Right Eye Progression has no prior data. Early phase findings include vascular perfusion defect, leakage, microaneurysm, retinal neovascularization. Mid/Late phase findings include leakage, microaneurysm, retinal neovascularization, vascular perfusion defect.   Left Eye Progression has no prior data. Early phase findings include vascular perfusion defect, leakage, microaneurysm. Mid/Late phase findings include microaneurysm, leakage, vascular perfusion defect.   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 Epiretinal membrane (ERM) of right eye H35.371   3. Retinal edema H35.81 OCT, Retina - OU - Both Eyes    Fluorescein Angiography Optos (Transit OD)    1. 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 Manuella Ghazi and [employer] -- will defer further work up for VHL and treatment/management to them - will forward notes and Optos and OCT studies to Dr. Manuella Ghazi - f/u here prn  2,3. Preretinal fibrosis with vitreoretinal traction - significant areas of vitreous traction causing retinal edema - concern for progression to TRD  4. Combined form age related cataract OU-  - The symptoms of cataract, surgical options, and treatments and risks were discussed with patient. - discussed diagnosis and progression - approaching visual significance OD - monitor for now   Ophthalmic Meds Ordered this visit:  No orders of the defined types were placed in this encounter.      Return if symptoms worsen or fail to improve.  There are no Patient Instructions on file for this visit.   Explained the diagnoses, plan, and follow up with the patient and they expressed understanding.  Patient expressed understanding of the importance of proper follow up care.   This document serves as a record of services personally performed by Gardiner Sleeper, MD, PhD. It was created on their behalf by Catha Brow, [place], a certified  ophthalmic assistant. The creation of this record is the provider's dictation and/or activities during the visit.  Electronically signed by: Catha Brow, [place]  07.11.19 11:40 PM   Gardiner Sleeper, M.D.,  Ph.D. Diseases & Surgery of the Retina and Vitreous [facility]  I have reviewed the above documentation for accuracy and completeness, and I agree with the above. Gardiner Sleeper, M.D., Ph.D. 10/24/17 4:42 PM     Abbreviations: M myopia (nearsighted); A astigmatism; H hyperopia (farsighted); P presbyopia; Mrx spectacle prescription;  CTL contact lenses; OD right eye; OS left eye; OU both eyes  XT exotropia; ET esotropia; PEK punctate epithelial keratitis; PEE punctate epithelial erosions; DES dry eye syndrome; MGD meibomian gland dysfunction; ATs artificial tears; PFAT's preservative free artificial tears; [place] nuclear sclerotic cataract; PSC posterior subcapsular cataract; ERM epi-retinal membrane; PVD posterior vitreous detachment; RD retinal detachment; DM diabetes mellitus; DR diabetic retinopathy; NPDR non-proliferative diabetic retinopathy; PDR proliferative diabetic retinopathy; CSME clinically significant macular edema; DME diabetic macular edema; dbh dot blot hemorrhages; CWS cotton wool spot; POAG primary open angle glaucoma; C/D cup-to-disc ratio; HVF humphrey visual field; GVF goldmann visual field; OCT optical coherence tomography; IOP intraocular pressure; BRVO Branch retinal vein occlusion; CRVO central retinal vein occlusion; CRAO central retinal artery occlusion; BRAO branch retinal artery occlusion; RT retinal tear; SB scleral buckle; PPV pars plana vitrectomy; VH Vitreous hemorrhage; PRP panretinal laser photocoagulation; IVK intravitreal kenalog; VMT vitreomacular traction; MH Macular hole;  NVD neovascularization of the disc; NVE neovascularization elsewhere; AREDS age related eye disease study; ARMD age related macular degeneration; POAG primary open angle glaucoma; EBMD epithelial/anterior basement membrane dystrophy; ACIOL anterior chamber intraocular lens; IOL intraocular lens; PCIOL posterior chamber intraocular lens; Phaco/IOL phacoemulsification with  intraocular lens placement; [place] photorefractive keratectomy; LASIK laser assisted in situ keratomileusis; HTN hypertension; DM diabetes mellitus; COPD chronic obstructive pulmonary disease

## 2017-10-23 ENCOUNTER — Ambulatory Visit (INDEPENDENT_AMBULATORY_CARE_PROVIDER_SITE_OTHER): Payer: Medicare HMO | Admitting: Ophthalmology

## 2017-10-23 ENCOUNTER — Encounter (INDEPENDENT_AMBULATORY_CARE_PROVIDER_SITE_OTHER): Payer: Self-pay | Admitting: Ophthalmology

## 2017-10-23 DIAGNOSIS — H3581 Retinal edema: Secondary | ICD-10-CM

## 2017-10-23 DIAGNOSIS — D1809 Hemangioma of other sites: Secondary | ICD-10-CM

## 2017-10-23 DIAGNOSIS — H35371 Puckering of macula, right eye: Secondary | ICD-10-CM | POA: Diagnosis not present

## 2017-10-23 DIAGNOSIS — H25813 Combined forms of age-related cataract, bilateral: Secondary | ICD-10-CM

## 2017-10-23 DIAGNOSIS — D487 Neoplasm of uncertain behavior of other specified sites: Secondary | ICD-10-CM

## 2017-10-31 ENCOUNTER — Other Ambulatory Visit: Payer: Self-pay | Admitting: Internal Medicine

## 2017-10-31 DIAGNOSIS — Z7984 Long term (current) use of oral hypoglycemic drugs: Secondary | ICD-10-CM | POA: Diagnosis not present

## 2017-10-31 DIAGNOSIS — E113413 Type 2 diabetes mellitus with severe nonproliferative diabetic retinopathy with macular edema, bilateral: Secondary | ICD-10-CM | POA: Diagnosis not present

## 2017-10-31 DIAGNOSIS — H35371 Puckering of macula, right eye: Secondary | ICD-10-CM | POA: Diagnosis not present

## 2017-10-31 DIAGNOSIS — H35033 Hypertensive retinopathy, bilateral: Secondary | ICD-10-CM | POA: Diagnosis not present

## 2017-10-31 DIAGNOSIS — H3581 Retinal edema: Secondary | ICD-10-CM | POA: Diagnosis not present

## 2017-10-31 DIAGNOSIS — H2513 Age-related nuclear cataract, bilateral: Secondary | ICD-10-CM | POA: Diagnosis not present

## 2017-11-07 ENCOUNTER — Encounter: Payer: Self-pay | Admitting: Internal Medicine

## 2017-11-07 ENCOUNTER — Ambulatory Visit (INDEPENDENT_AMBULATORY_CARE_PROVIDER_SITE_OTHER): Payer: Medicare HMO | Admitting: Internal Medicine

## 2017-11-07 VITALS — BP 136/86 | HR 83 | Temp 97.9°F | Ht 62.0 in | Wt 218.0 lb

## 2017-11-07 DIAGNOSIS — I872 Venous insufficiency (chronic) (peripheral): Secondary | ICD-10-CM | POA: Diagnosis not present

## 2017-11-07 DIAGNOSIS — E1165 Type 2 diabetes mellitus with hyperglycemia: Secondary | ICD-10-CM

## 2017-11-07 DIAGNOSIS — E1149 Type 2 diabetes mellitus with other diabetic neurological complication: Secondary | ICD-10-CM

## 2017-11-07 DIAGNOSIS — IMO0002 Reserved for concepts with insufficient information to code with codable children: Secondary | ICD-10-CM

## 2017-11-07 LAB — POCT GLYCOSYLATED HEMOGLOBIN (HGB A1C): Hemoglobin A1C: 10 % — AB (ref 4.0–5.6)

## 2017-11-07 MED ORDER — PIOGLITAZONE HCL 30 MG PO TABS: ORAL_TABLET | ORAL | 1 refills | Status: DC

## 2017-11-07 MED ORDER — TORSEMIDE 20 MG PO TABS: 20.0000 mg | ORAL_TABLET | Freq: Every day | ORAL | 0 refills | Status: DC

## 2017-11-07 MED ORDER — EMPAGLIFLOZIN 25 MG PO TABS: 25.0000 mg | ORAL_TABLET | Freq: Every day | ORAL | 1 refills | Status: DC

## 2017-11-07 NOTE — Assessment & Plan Note (Signed)
She uses compression stockings and still has significant swelling. She feels that lasix does not do anything and will change to torsemide to see if this helps more.

## 2017-11-07 NOTE — Patient Instructions (Signed)
We have sent in jardiance to take 1 pill daily for diabetes.   We have sent in torsemide to replace lasix (furosemide) for the fluid to take 1 pill daily.   We will see you back in 3 months.

## 2017-11-07 NOTE — Assessment & Plan Note (Signed)
Also with severe bilateral retinopathy from most recent eye visit. She is having blurry vision in the right eye. HgA1c done in the office 10.0 today which is too high. She declines any insulin today or injections. Rx for jardiance to add on to actos, glimepiride and metformin.

## 2017-11-07 NOTE — Progress Notes (Signed)
   Subjective:    Patient ID: Cheryl Delgado, female    DOB: 28-Aug-1945, 72 y.o.   MRN: [id number]  HPI The patient is a 72 YO female coming in for follow up of her uncontrolled diabetes. She did not call us back to make changes to her medications. She has worked on taking medications regularly and now admits to take metformin BID most of the time and other meds all the time. She has been to the eye doctor and has severe changes from diabetes in both eyes. She is continuing to follow with them for partial retinal detachment in right eye. She has neuropathy which is stable from prior. She does not feel any different than last time. She does take ARB but not statin.   Review of Systems  Constitutional: Negative.   HENT: Negative.   Eyes: Positive for visual disturbance.  Respiratory: Negative for cough, chest tightness and shortness of breath.   Cardiovascular: Negative for chest pain, palpitations and leg swelling.  Gastrointestinal: Negative for abdominal distention, abdominal pain, constipation, diarrhea, nausea and vomiting.  Musculoskeletal: Negative.   Skin: Negative.   Neurological: Positive for numbness.  Psychiatric/Behavioral: Negative.       Objective:   Physical Exam  Constitutional: She is oriented to person, place, and time. She appears well-developed and well-nourished.  HENT:  Head: Normocephalic and atraumatic.  Eyes: EOM are normal.  Neck: Normal range of motion.  Cardiovascular: Normal rate and regular rhythm.  Pulmonary/Chest: Effort normal and breath sounds normal. No respiratory distress. She has no wheezes. She has no rales.  Abdominal: Soft. Bowel sounds are normal. She exhibits no distension. There is no tenderness. There is no rebound.  Musculoskeletal: She exhibits no edema.  Neurological: She is alert and oriented to person, place, and time. Coordination normal.  Skin: Skin is warm and dry.  Psychiatric: She has a normal mood and affect.   Vitals:   11/07/17 1026  BP: 136/86  Pulse: 83  Temp: 97.9 F (36.6 C)  TempSrc: Oral  SpO2: 96%  Weight: 218 lb (98.9 kg)  Height: 5\' 2"  (1.575 m)      Assessment & Plan:  Visit time 25 minutes: greater than 50% of that time was spent in face to face counseling and coordination of care with the patient: counseled about her uncontrolled diabetes and explaining that she already has severe complications of the diabetes which can only get worse without good control of diabetes, counseling about need for insulin which she declines and then counseling about alternative options.

## 2017-12-02 DIAGNOSIS — E113413 Type 2 diabetes mellitus with severe nonproliferative diabetic retinopathy with macular edema, bilateral: Secondary | ICD-10-CM | POA: Diagnosis not present

## 2017-12-02 DIAGNOSIS — E113411 Type 2 diabetes mellitus with severe nonproliferative diabetic retinopathy with macular edema, right eye: Secondary | ICD-10-CM | POA: Diagnosis not present

## 2018-01-02 DIAGNOSIS — H35371 Puckering of macula, right eye: Secondary | ICD-10-CM | POA: Diagnosis not present

## 2018-01-02 DIAGNOSIS — H3581 Retinal edema: Secondary | ICD-10-CM | POA: Diagnosis not present

## 2018-01-02 DIAGNOSIS — E113411 Type 2 diabetes mellitus with severe nonproliferative diabetic retinopathy with macular edema, right eye: Secondary | ICD-10-CM | POA: Diagnosis not present

## 2018-01-20 ENCOUNTER — Other Ambulatory Visit: Payer: Self-pay | Admitting: Internal Medicine

## 2018-01-30 DIAGNOSIS — H3581 Retinal edema: Secondary | ICD-10-CM | POA: Diagnosis not present

## 2018-01-30 DIAGNOSIS — E113411 Type 2 diabetes mellitus with severe nonproliferative diabetic retinopathy with macular edema, right eye: Secondary | ICD-10-CM | POA: Diagnosis not present

## 2018-01-30 DIAGNOSIS — H35371 Puckering of macula, right eye: Secondary | ICD-10-CM | POA: Diagnosis not present

## 2018-02-09 ENCOUNTER — Ambulatory Visit: Payer: Medicare HMO | Admitting: Internal Medicine

## 2018-03-03 ENCOUNTER — Encounter: Payer: Self-pay | Admitting: Internal Medicine

## 2018-03-03 ENCOUNTER — Ambulatory Visit (INDEPENDENT_AMBULATORY_CARE_PROVIDER_SITE_OTHER): Payer: Medicare HMO | Admitting: Internal Medicine

## 2018-03-03 ENCOUNTER — Other Ambulatory Visit (INDEPENDENT_AMBULATORY_CARE_PROVIDER_SITE_OTHER): Payer: Medicare HMO

## 2018-03-03 VITALS — BP 110/78 | HR 78 | Temp 97.8°F | Ht 62.0 in | Wt 207.0 lb

## 2018-03-03 DIAGNOSIS — E1165 Type 2 diabetes mellitus with hyperglycemia: Secondary | ICD-10-CM

## 2018-03-03 DIAGNOSIS — IMO0002 Reserved for concepts with insufficient information to code with codable children: Secondary | ICD-10-CM

## 2018-03-03 DIAGNOSIS — E1149 Type 2 diabetes mellitus with other diabetic neurological complication: Secondary | ICD-10-CM

## 2018-03-03 DIAGNOSIS — Z23 Encounter for immunization: Secondary | ICD-10-CM

## 2018-03-03 LAB — CBC
HEMATOCRIT: 40.3 % (ref 36.0–46.0)
Hemoglobin: 13.2 g/dL (ref 12.0–15.0)
MCHC: 32.6 g/dL (ref 30.0–36.0)
MCV: 78.5 fl (ref 78.0–100.0)
Platelets: 193 10*3/uL (ref 150.0–400.0)
RBC: 5.14 Mil/uL — AB (ref 3.87–5.11)
RDW: 16.8 % — ABNORMAL HIGH (ref 11.5–15.5)
WBC: 5.3 10*3/uL (ref 4.0–10.5)

## 2018-03-03 LAB — POCT GLYCOSYLATED HEMOGLOBIN (HGB A1C): HEMOGLOBIN A1C: 9.3 % — AB (ref 4.0–5.6)

## 2018-03-03 LAB — LIPID PANEL
CHOLESTEROL: 164 mg/dL (ref 0–200)
HDL: 64.8 mg/dL (ref >39.00)
LDL Cholesterol: 80 mg/dL (ref 0–99)
NonHDL: 98.74
Total CHOL/HDL Ratio: 3
Triglycerides: 92 mg/dL (ref 0.0–149.0)
VLDL: 18.4 mg/dL (ref 0.0–40.0)

## 2018-03-03 LAB — COMPREHENSIVE METABOLIC PANEL
ALBUMIN: 4 g/dL (ref 3.5–5.2)
ALK PHOS: 65 U/L (ref 39–117)
ALT: 8 U/L (ref 0–35)
AST: 12 U/L (ref 0–37)
BUN: 23 mg/dL (ref 6–23)
CO2: 33 mEq/L — ABNORMAL HIGH (ref 19–32)
CREATININE: 0.7 mg/dL (ref 0.40–1.20)
Calcium: 9.9 mg/dL (ref 8.4–10.5)
Chloride: 100 mEq/L (ref 96–112)
GFR: 105.9 mL/min (ref >60.00)
Glucose, Bld: 175 mg/dL — ABNORMAL HIGH (ref 70–99)
Potassium: 4.2 mEq/L (ref 3.5–5.1)
SODIUM: 141 meq/L (ref 135–145)
TOTAL PROTEIN: 7.4 g/dL (ref 6.0–8.3)
Total Bilirubin: 0.5 mg/dL (ref 0.2–1.2)

## 2018-03-03 NOTE — Patient Instructions (Addendum)
Diabetes Mellitus and Nutrition When you have diabetes (diabetes mellitus), it is very important to have healthy eating habits because your blood sugar (glucose) levels are greatly affected by what you eat and drink. Eating healthy foods in the appropriate amounts, at about the same times every day, can help you:  Control your blood glucose.  Lower your risk of heart disease.  Improve your blood pressure.  Reach or maintain a healthy weight.  Every person with diabetes is different, and each person has different needs for a meal plan. Your health care provider may recommend that you work with a diet and nutrition specialist (dietitian) to make a meal plan that is best for you. Your meal plan may vary depending on factors such as:  The calories you need.  The medicines you take.  Your weight.  Your blood glucose, blood pressure, and cholesterol levels.  Your activity level.  Other health conditions you have, such as heart or kidney disease.  How do carbohydrates affect me? Carbohydrates affect your blood glucose level more than any other type of food. Eating carbohydrates naturally increases the amount of glucose in your blood. Carbohydrate counting is a method for keeping track of how many carbohydrates you eat. Counting carbohydrates is important to keep your blood glucose at a healthy level, especially if you use insulin or take certain oral diabetes medicines. It is important to know how many carbohydrates you can safely have in each meal. This is different for every person. Your dietitian can help you calculate how many carbohydrates you should have at each meal and for snack. Foods that contain carbohydrates include:  Bread, cereal, rice, pasta, and crackers.  Potatoes and corn.  Peas, beans, and lentils.  Milk and yogurt.  Fruit and juice.  Desserts, such as cakes, cookies, ice cream, and candy.  How does alcohol affect me? Alcohol can cause a sudden decrease in blood  glucose (hypoglycemia), especially if you use insulin or take certain oral diabetes medicines. Hypoglycemia can be a life-threatening condition. Symptoms of hypoglycemia (sleepiness, dizziness, and confusion) are similar to symptoms of having too much alcohol. If your health care provider says that alcohol is safe for you, follow these guidelines:  Limit alcohol intake to no more than 1 drink per day for nonpregnant women and 2 drinks per day for men. One drink equals 12 oz of beer, 5 oz of wine, or 1 oz of hard liquor.  Do not drink on an empty stomach.  Keep yourself hydrated with water, diet soda, or unsweetened iced tea.  Keep in mind that regular soda, juice, and other mixers may contain a lot of sugar and must be counted as carbohydrates.  What are tips for following this plan? Reading food labels  Start by checking the serving size on the label. The amount of calories, carbohydrates, fats, and other nutrients listed on the label are based on one serving of the food. Many foods contain more than one serving per package.  Check the total grams (g) of carbohydrates in one serving. You can calculate the number of servings of carbohydrates in one serving by dividing the total carbohydrates by 15. For example, if a food has 30 g of total carbohydrates, it would be equal to 2 servings of carbohydrates.  Check the number of grams (g) of saturated and trans fats in one serving. Choose foods that have low or no amount of these fats.  Check the number of milligrams (mg) of sodium in one serving. Most people   should limit total sodium intake to less than 2,300 mg per day.  Always check the nutrition information of foods labeled as "low-fat" or "nonfat". These foods may be higher in added sugar or refined carbohydrates and should be avoided.  Talk to your dietitian to identify your daily goals for nutrients listed on the label. Shopping  Avoid buying canned, premade, or processed foods. These  foods tend to be high in fat, sodium, and added sugar.  Shop around the outside edge of the grocery store. This includes fresh fruits and vegetables, bulk grains, fresh meats, and fresh dairy. Cooking  Use low-heat cooking methods, such as baking, instead of high-heat cooking methods like deep frying.  Cook using healthy oils, such as olive, canola, or sunflower oil.  Avoid cooking with butter, cream, or high-fat meats. Meal planning  Eat meals and snacks regularly, preferably at the same times every day. Avoid going long periods of time without eating.  Eat foods high in fiber, such as fresh fruits, vegetables, beans, and whole grains. Talk to your dietitian about how many servings of carbohydrates you can eat at each meal.  Eat 4-6 ounces of lean protein each day, such as lean meat, chicken, fish, eggs, or tofu. 1 ounce is equal to 1 ounce of meat, chicken, or fish, 1 egg, or 1/4 cup of tofu.  Eat some foods each day that contain healthy fats, such as avocado, nuts, seeds, and fish. Lifestyle   Check your blood glucose regularly.  Exercise at least 30 minutes 5 or more days each week, or as told by your health care provider.  Take medicines as told by your health care provider.  Do not use any products that contain nicotine or tobacco, such as cigarettes and e-cigarettes. If you need help quitting, ask your health care provider.  Work with a counselor or diabetes educator to identify strategies to manage stress and any emotional and social challenges. What are some questions to ask my health care provider?  Do I need to meet with a diabetes educator?  Do I need to meet with a dietitian?  What number can I call if I have questions?  When are the best times to check my blood glucose? Where to find more information:  American Diabetes Association: diabetes.org/food-and-fitness/food  Academy of Nutrition and Dietetics:  www.eatright.org/resources/health/diseases-and-conditions/diabetes  National Institute of Diabetes and Digestive and Kidney Diseases (NIH): www.niddk.nih.gov/health-information/diabetes/overview/diet-eating-physical-activity Summary  A healthy meal plan will help you control your blood glucose and maintain a healthy lifestyle.  Working with a diet and nutrition specialist (dietitian) can help you make a meal plan that is best for you.  Keep in mind that carbohydrates and alcohol have immediate effects on your blood glucose levels. It is important to count carbohydrates and to use alcohol carefully. This information is not intended to replace advice given to you by your health care provider. Make sure you discuss any questions you have with your health care provider. Document Released: 12/27/2004 Document Revised: 05/06/2016 Document Reviewed: 05/06/2016 Elsevier Interactive Patient Education  2018 Elsevier Inc.  

## 2018-03-03 NOTE — Assessment & Plan Note (Signed)
Still moderate exacerbation. HgA1c done in the office 9.3 although she is not taking jardiance full 3 months. Will check fructosamine today. Adjust actos, jardiance, metformin, amaryl as needed. Taking ARB but not statin. Checking lipid panel and CMP. Adjust as needed.

## 2018-03-03 NOTE — Progress Notes (Signed)
   Subjective:    Patient ID: Cheryl Delgado, female    DOB: 1946-04-07, 72 y.o.   MRN: [id number]  HPI The patient is a 72 YO female coming in for follow up of her poorly controlled complicated diabetes. She does have severe retinopathy in both eyes from this which she is seeing eye specialist for. She is taking jardiance, metformin, amaryl, actos for the diabetes and did start the jardiance about 1-2 months ago. Denies side effects with it. Denies new numbness. Eyes are stable and vision is acceptable.   Review of Systems  Constitutional: Negative.   HENT: Negative.   Eyes: Positive for visual disturbance.  Respiratory: Negative for cough, chest tightness and shortness of breath.   Cardiovascular: Negative for chest pain, palpitations and leg swelling.  Gastrointestinal: Negative for abdominal distention, abdominal pain, constipation, diarrhea, nausea and vomiting.  Musculoskeletal: Negative.   Skin: Negative.   Neurological: Positive for numbness.  Psychiatric/Behavioral: Negative.       Objective:   Physical Exam  Constitutional: She is oriented to person, place, and time. She appears well-developed and well-nourished.  HENT:  Head: Normocephalic and atraumatic.  Eyes: EOM are normal.  Neck: Normal range of motion.  Cardiovascular: Normal rate and regular rhythm.  Pulmonary/Chest: Effort normal and breath sounds normal. No respiratory distress. She has no wheezes. She has no rales.  Abdominal: Soft. Bowel sounds are normal. She exhibits no distension. There is no tenderness. There is no rebound.  Musculoskeletal: She exhibits no edema.  Neurological: She is alert and oriented to person, place, and time. Coordination normal.  Skin: Skin is warm and dry.  Psychiatric: She has a normal mood and affect.   Vitals:   03/03/18 1012  BP: 110/78  Pulse: 78  Temp: 97.8 F (36.6 C)  TempSrc: Oral  SpO2: 99%  Weight: 207 lb (93.9 kg)  Height: 5\' 2"  (1.575 m)   Flu shot given at  visit    Assessment & Plan:  Visit time 25 minutes: greater than 50% of that time was spent in face to face counseling and coordination of care with the patient: counseled about diabetes and the complications as well as the time course of HgA1c and fructosamine as well as medications and the way they work and her overall goal.

## 2018-03-05 LAB — FRUCTOSAMINE: FRUCTOSAMINE: 347 umol/L — AB (ref 205–285)

## 2018-03-13 DIAGNOSIS — Z7984 Long term (current) use of oral hypoglycemic drugs: Secondary | ICD-10-CM | POA: Diagnosis not present

## 2018-03-13 DIAGNOSIS — H35371 Puckering of macula, right eye: Secondary | ICD-10-CM | POA: Diagnosis not present

## 2018-03-13 DIAGNOSIS — E113413 Type 2 diabetes mellitus with severe nonproliferative diabetic retinopathy with macular edema, bilateral: Secondary | ICD-10-CM | POA: Diagnosis not present

## 2018-04-01 ENCOUNTER — Other Ambulatory Visit: Payer: Self-pay | Admitting: Internal Medicine

## 2018-05-12 DIAGNOSIS — E113413 Type 2 diabetes mellitus with severe nonproliferative diabetic retinopathy with macular edema, bilateral: Secondary | ICD-10-CM | POA: Diagnosis not present

## 2018-05-12 DIAGNOSIS — Z7984 Long term (current) use of oral hypoglycemic drugs: Secondary | ICD-10-CM | POA: Diagnosis not present

## 2018-05-12 DIAGNOSIS — H35371 Puckering of macula, right eye: Secondary | ICD-10-CM | POA: Diagnosis not present

## 2018-06-01 ENCOUNTER — Encounter: Payer: Medicare HMO | Admitting: Internal Medicine

## 2018-06-26 ENCOUNTER — Other Ambulatory Visit: Payer: Self-pay

## 2018-06-26 ENCOUNTER — Encounter: Payer: Self-pay | Admitting: Internal Medicine

## 2018-06-26 ENCOUNTER — Ambulatory Visit (INDEPENDENT_AMBULATORY_CARE_PROVIDER_SITE_OTHER): Payer: Medicare HMO | Admitting: Internal Medicine

## 2018-06-26 VITALS — BP 120/84 | HR 66 | Temp 97.9°F | Ht 62.0 in | Wt 210.0 lb

## 2018-06-26 DIAGNOSIS — F17210 Nicotine dependence, cigarettes, uncomplicated: Secondary | ICD-10-CM | POA: Diagnosis not present

## 2018-06-26 DIAGNOSIS — R69 Illness, unspecified: Secondary | ICD-10-CM | POA: Diagnosis not present

## 2018-06-26 DIAGNOSIS — Z0000 Encounter for general adult medical examination without abnormal findings: Secondary | ICD-10-CM | POA: Diagnosis not present

## 2018-06-26 DIAGNOSIS — E1165 Type 2 diabetes mellitus with hyperglycemia: Secondary | ICD-10-CM | POA: Diagnosis not present

## 2018-06-26 DIAGNOSIS — I10 Essential (primary) hypertension: Secondary | ICD-10-CM

## 2018-06-26 DIAGNOSIS — Z23 Encounter for immunization: Secondary | ICD-10-CM | POA: Diagnosis not present

## 2018-06-26 DIAGNOSIS — IMO0002 Reserved for concepts with insufficient information to code with codable children: Secondary | ICD-10-CM

## 2018-06-26 DIAGNOSIS — E1149 Type 2 diabetes mellitus with other diabetic neurological complication: Secondary | ICD-10-CM

## 2018-06-26 LAB — POCT GLYCOSYLATED HEMOGLOBIN (HGB A1C): Hemoglobin A1C: 8.4 % — AB (ref 4.0–5.6)

## 2018-06-26 NOTE — Assessment & Plan Note (Signed)
Flu shot up to date. Pneumonia 23 given. Shingrix declines. Tetanus declines. Cologuard up to date. Mammogram declines, pap smear aged out and dexa declines. Counseled about sun safety and mole surveillance. Counseled about the dangers of distracted driving. Given 10 year screening recommendations.

## 2018-06-26 NOTE — Progress Notes (Signed)
   Subjective:   Patient ID: Cheryl Delgado, female    DOB: [birth year], 73 y.o.   MRN: [id number]  HPI Here for medicare wellness and physical, no new complaints. Please see A/P for status and treatment of chronic medical problems.   Diet: DM since diabetic Physical activity: sedentary Depression/mood screen: negative Hearing: intact to whispered voice Visual acuity: grossly normal with lens, performs annual eye exam  ADLs: capable Fall risk: none Home safety: good Cognitive evaluation: intact to orientation, naming, recall and repetition EOL planning: adv directives discussed    Office Visit from 06/26/2018 in [facility]  PHQ-2 Total Score  0      I have personally reviewed and have noted 1. The patient's medical and social history - reviewed today no changes 2. Their use of alcohol, tobacco or illicit drugs 3. Their current medications and supplements 4. The patient's functional ability including ADL's, fall risks, home safety risks and hearing or visual impairment. 5. Diet and physical activities 6. Evidence for depression or mood disorders 7. Care team reviewed and updated (available in snapshot)  Review of Systems  Constitutional: Negative.   HENT: Negative.   Eyes: Negative.   Respiratory: Negative for cough, chest tightness and shortness of breath.   Cardiovascular: Negative for chest pain, palpitations and leg swelling.  Gastrointestinal: Negative for abdominal distention, abdominal pain, constipation, diarrhea, nausea and vomiting.  Musculoskeletal: Negative.   Skin: Negative.   Neurological: Negative.   Psychiatric/Behavioral: Negative.     Objective:  Physical Exam Constitutional:      Appearance: She is well-developed. She is obese.  HENT:     Head: Normocephalic and atraumatic.  Neck:     Musculoskeletal: Normal range of motion.  Cardiovascular:     Rate and Rhythm: Normal rate and regular rhythm.  Pulmonary:     Effort:  Pulmonary effort is normal. No respiratory distress.     Breath sounds: Normal breath sounds. No wheezing or rales.  Abdominal:     General: Bowel sounds are normal. There is no distension.     Palpations: Abdomen is soft.     Tenderness: There is no abdominal tenderness. There is no rebound.  Skin:    General: Skin is warm and dry.     Comments: Foot exam done  Neurological:     Mental Status: She is alert and oriented to person, place, and time.     Coordination: Coordination normal.     Vitals:   06/26/18 1030  BP: 120/84  Pulse: 66  Temp: 97.9 F (36.6 C)  TempSrc: Oral  SpO2: 97%  Weight: 210 lb (95.3 kg)  Height: 5\' 2"  (1.575 m)    Assessment & Plan:  Pneumonia 23 given at visit

## 2018-06-26 NOTE — Assessment & Plan Note (Signed)
Advised to quit and she declines today.

## 2018-06-26 NOTE — Patient Instructions (Signed)

## 2018-06-26 NOTE — Assessment & Plan Note (Signed)
BMI 38 but with diabetes, htn, hyperlipidemia. She is working on weight loss but not having much success.

## 2018-06-26 NOTE — Assessment & Plan Note (Signed)
HgA1c 8.4 in the office today which is an improvement from prior. She will continue jardiance, actos, metformin, amaryl. Is on ARB but not statin. Discussed cardiac risk today of 38% which is extremely high.

## 2018-06-26 NOTE — Assessment & Plan Note (Signed)
BP at goal on losartan and torsemide. Recent CMP at goal and no indication for change.

## 2018-06-29 ENCOUNTER — Other Ambulatory Visit: Payer: Self-pay | Admitting: Internal Medicine

## 2018-08-26 ENCOUNTER — Other Ambulatory Visit: Payer: Self-pay | Admitting: Internal Medicine

## 2018-09-14 ENCOUNTER — Other Ambulatory Visit: Payer: Self-pay | Admitting: Internal Medicine

## 2018-09-29 ENCOUNTER — Other Ambulatory Visit: Payer: Self-pay

## 2018-09-29 ENCOUNTER — Encounter: Payer: Self-pay | Admitting: Internal Medicine

## 2018-09-29 ENCOUNTER — Ambulatory Visit (INDEPENDENT_AMBULATORY_CARE_PROVIDER_SITE_OTHER): Payer: Medicare HMO | Admitting: Internal Medicine

## 2018-09-29 VITALS — BP 132/88 | HR 66 | Temp 97.4°F | Ht 62.0 in | Wt 218.0 lb

## 2018-09-29 DIAGNOSIS — E1165 Type 2 diabetes mellitus with hyperglycemia: Secondary | ICD-10-CM

## 2018-09-29 DIAGNOSIS — E1149 Type 2 diabetes mellitus with other diabetic neurological complication: Secondary | ICD-10-CM | POA: Diagnosis not present

## 2018-09-29 DIAGNOSIS — IMO0002 Reserved for concepts with insufficient information to code with codable children: Secondary | ICD-10-CM

## 2018-09-29 LAB — POCT GLYCOSYLATED HEMOGLOBIN (HGB A1C): Hemoglobin A1C: 8.1 % — AB (ref 4.0–5.6)

## 2018-09-29 NOTE — Assessment & Plan Note (Signed)
HgA1c checked today and improved at 8.1. She will stay on actos, jardiance, amaryl, metformin. Diet is not ideal due to pandemic and she will work on losing the 8 pounds she has gained.

## 2018-09-29 NOTE — Progress Notes (Signed)
   Subjective:   Patient ID: Cheryl Delgado, female    DOB: 1946-01-26, 73 y.o.   MRN: [id number]  HPI The patient is a 73 YO female coming in for follow up of her diabetes which is poorly controlled. She was closer to goal last time with HgA1c 8.4. She is taking amaryl, jardiance, actos, metformin currently. Weight is up about 8 pounds since the pandemic has started. Denies great diet compliance in the last several months as well. Denies new numbness or weakness in the last 3 months.   Review of Systems  Constitutional: Negative.   HENT: Negative.   Eyes: Negative.   Respiratory: Negative for cough, chest tightness and shortness of breath.   Cardiovascular: Negative for chest pain, palpitations and leg swelling.  Gastrointestinal: Negative for abdominal distention, abdominal pain, constipation, diarrhea, nausea and vomiting.  Musculoskeletal: Negative.   Skin: Negative.   Neurological: Negative.   Psychiatric/Behavioral: Negative.     Objective:  Physical Exam Constitutional:      Appearance: She is well-developed.  HENT:     Head: Normocephalic and atraumatic.  Neck:     Musculoskeletal: Normal range of motion.  Cardiovascular:     Rate and Rhythm: Normal rate and regular rhythm.  Pulmonary:     Effort: Pulmonary effort is normal. No respiratory distress.     Breath sounds: Normal breath sounds. No wheezing or rales.  Abdominal:     General: Bowel sounds are normal. There is no distension.     Palpations: Abdomen is soft.     Tenderness: There is no abdominal tenderness. There is no rebound.  Skin:    General: Skin is warm and dry.  Neurological:     Mental Status: She is alert and oriented to person, place, and time.     Coordination: Coordination normal.     Vitals:   09/29/18 1307  BP: 132/88  Pulse: 66  Temp: (!) 97.4 F (36.3 C)  TempSrc: Oral  Weight: 218 lb (98.9 kg)  Height: 5\' 2"  (1.575 m)    Assessment & Plan:

## 2018-10-13 ENCOUNTER — Other Ambulatory Visit: Payer: Self-pay | Admitting: Internal Medicine

## 2019-02-12 ENCOUNTER — Other Ambulatory Visit: Payer: Self-pay | Admitting: Internal Medicine

## 2019-02-12 DIAGNOSIS — Z1231 Encounter for screening mammogram for malignant neoplasm of breast: Secondary | ICD-10-CM

## 2019-03-13 DIAGNOSIS — R69 Illness, unspecified: Secondary | ICD-10-CM | POA: Diagnosis not present

## 2019-03-15 ENCOUNTER — Other Ambulatory Visit: Payer: Self-pay | Admitting: Internal Medicine

## 2019-04-01 ENCOUNTER — Ambulatory Visit (INDEPENDENT_AMBULATORY_CARE_PROVIDER_SITE_OTHER): Payer: Medicare HMO | Admitting: Internal Medicine

## 2019-04-01 ENCOUNTER — Encounter: Payer: Self-pay | Admitting: Internal Medicine

## 2019-04-01 ENCOUNTER — Other Ambulatory Visit: Payer: Self-pay

## 2019-04-01 ENCOUNTER — Other Ambulatory Visit (INDEPENDENT_AMBULATORY_CARE_PROVIDER_SITE_OTHER): Payer: Medicare HMO

## 2019-04-01 VITALS — BP 124/80 | HR 66 | Temp 98.0°F | Ht 62.0 in | Wt 227.0 lb

## 2019-04-01 DIAGNOSIS — R69 Illness, unspecified: Secondary | ICD-10-CM | POA: Diagnosis not present

## 2019-04-01 DIAGNOSIS — E1149 Type 2 diabetes mellitus with other diabetic neurological complication: Secondary | ICD-10-CM

## 2019-04-01 DIAGNOSIS — IMO0002 Reserved for concepts with insufficient information to code with codable children: Secondary | ICD-10-CM

## 2019-04-01 DIAGNOSIS — E785 Hyperlipidemia, unspecified: Secondary | ICD-10-CM

## 2019-04-01 DIAGNOSIS — E1169 Type 2 diabetes mellitus with other specified complication: Secondary | ICD-10-CM | POA: Diagnosis not present

## 2019-04-01 DIAGNOSIS — F17210 Nicotine dependence, cigarettes, uncomplicated: Secondary | ICD-10-CM

## 2019-04-01 DIAGNOSIS — E1165 Type 2 diabetes mellitus with hyperglycemia: Secondary | ICD-10-CM

## 2019-04-01 LAB — LIPID PANEL
Cholesterol: 161 mg/dL (ref 0–200)
HDL: 65.4 mg/dL
LDL Cholesterol: 78 mg/dL (ref 0–99)
NonHDL: 95.7
Total CHOL/HDL Ratio: 2
Triglycerides: 88 mg/dL (ref 0.0–149.0)
VLDL: 17.6 mg/dL (ref 0.0–40.0)

## 2019-04-01 LAB — CBC
HCT: 39.6 % (ref 36.0–46.0)
Hemoglobin: 12.6 g/dL (ref 12.0–15.0)
MCHC: 32 g/dL (ref 30.0–36.0)
MCV: 79.3 fl (ref 78.0–100.0)
Platelets: 205 K/uL (ref 150.0–400.0)
RBC: 4.99 Mil/uL (ref 3.87–5.11)
RDW: 17.7 % — ABNORMAL HIGH (ref 11.5–15.5)
WBC: 6.8 K/uL (ref 4.0–10.5)

## 2019-04-01 LAB — COMPREHENSIVE METABOLIC PANEL
ALT: 9 U/L (ref 0–35)
AST: 13 U/L (ref 0–37)
Albumin: 3.9 g/dL (ref 3.5–5.2)
Alkaline Phosphatase: 66 U/L (ref 39–117)
BUN: 23 mg/dL (ref 6–23)
CO2: 30 mEq/L (ref 19–32)
Calcium: 9.6 mg/dL (ref 8.4–10.5)
Chloride: 102 mEq/L (ref 96–112)
Creatinine, Ser: 0.73 mg/dL (ref 0.40–1.20)
GFR: 94.64 mL/min (ref >60.00)
Glucose, Bld: 146 mg/dL — ABNORMAL HIGH (ref 70–99)
Potassium: 4 mEq/L (ref 3.5–5.1)
Sodium: 138 mEq/L (ref 135–145)
Total Bilirubin: 0.3 mg/dL (ref 0.2–1.2)
Total Protein: 7 g/dL (ref 6.0–8.3)

## 2019-04-01 LAB — HEMOGLOBIN A1C: Hgb A1c MFr Bld: 8.7 % — ABNORMAL HIGH (ref 4.6–6.5)

## 2019-04-01 MED ORDER — ROSUVASTATIN CALCIUM 10 MG PO TABS: 10.0000 mg | ORAL_TABLET | Freq: Every day | ORAL | 3 refills | Status: DC

## 2019-04-01 NOTE — Patient Instructions (Addendum)
We have sent in the crestor to start taking 1 pill daily to help lower your risk for heart attack and stroke. You are at about 36% risk of heart attack or stroke in the next 10 years.   Stopping smoking can also help lower your risk for heart attack or stroke.   The EKG of the heart today looks normal.   Diabetes Mellitus and Standards of Medical Care Managing diabetes (diabetes mellitus) can be complicated. Your diabetes treatment may be managed by a team of health care providers, including:  A physician who specializes in diabetes (endocrinologist).  A nurse practitioner or physician assistant.  Nurses.  A diet and nutrition specialist (registered dietitian).  A certified diabetes educator (CDE).  An exercise specialist.  A pharmacist.  An eye doctor.  A foot specialist (podiatrist).  A dentist.  A primary care provider.  A mental health provider. Your health care providers follow guidelines to help you get the best quality of care. The following schedule is a general guideline for your diabetes management plan. Your health care providers may give you more specific instructions. Physical exams Upon being diagnosed with diabetes mellitus, and each year after that, your health care provider will ask about your medical and family history. He or she will also do a physical exam. Your exam may include:  Measuring your height, weight, and body mass index (BMI).  Checking your blood pressure. This will be done at every routine medical visit. Your target blood pressure may vary depending on your medical conditions, your age, and other factors.  Thyroid gland exam.  Skin exam.  Screening for damage to your nerves (peripheral neuropathy). This may include checking the pulse in your legs and feet and checking the level of sensation in your hands and feet.  A complete foot exam to inspect the structure and skin of your feet, including checking for cuts, bruises, redness, blisters,  sores, or other problems.  Screening for blood vessel (vascular) problems, which may include checking the pulse in your legs and feet and checking your temperature. Blood tests Depending on your treatment plan and your personal needs, you may have the following tests done:  HbA1c (hemoglobin A1c). This test provides information about blood sugar (glucose) control over the previous 2-3 months. It is used to adjust your treatment plan, if needed. This test will be done: ? At least 2 times a year, if you are meeting your treatment goals. ? 4 times a year, if you are not meeting your treatment goals or if treatment goals have changed.  Lipid testing, including total, LDL, and HDL cholesterol and triglyceride levels. ? The goal for LDL is less than 100 mg/dL (5.5 mmol/L). If you are at high risk for complications, the goal is less than 70 mg/dL (3.9 mmol/L). ? The goal for HDL is 40 mg/dL (2.2 mmol/L) or higher for men and 50 mg/dL (2.8 mmol/L) or higher for women. An HDL cholesterol of 60 mg/dL (3.3 mmol/L) or higher gives some protection against heart disease. ? The goal for triglycerides is less than 150 mg/dL (8.3 mmol/L).  Liver function tests.  Kidney function tests.  Thyroid function tests. Dental and eye exams  Visit your dentist two times a year.  If you have type 1 diabetes, your health care provider may recommend an eye exam 3-5 years after you are diagnosed, and then once a year after your first exam. ? For children with type 1 diabetes, a health care provider may recommend an eye  exam when your child is age 36 or older and has had diabetes for 3-5 years. After the first exam, your child should get an eye exam once a year.  If you have type 2 diabetes, your health care provider may recommend an eye exam as soon as you are diagnosed, and then once a year after your first exam. Immunizations   The yearly flu (influenza) vaccine is recommended for everyone 72 months or older who has  diabetes.  The pneumonia (pneumococcal) vaccine is recommended for everyone 2 years or older who has diabetes. If you are 72 or older, you may get the pneumonia vaccine as a series of two separate shots.  The hepatitis B vaccine is recommended for adults shortly after being diagnosed with diabetes.  Adults and children with diabetes should receive all other vaccines according to age-specific recommendations from the Centers for Disease Control and Prevention (CDC). Mental and emotional health Screening for symptoms of eating disorders, anxiety, and depression is recommended at the time of diagnosis and afterward as needed. If your screening shows that you have symptoms (positive screening result), you may need more evaluation and you may work with a mental health care provider. Treatment plan Your treatment plan will be reviewed at every medical visit. You and your health care provider will discuss:  How you are taking your medicines, including insulin.  Any side effects you are experiencing.  Your blood glucose target goals.  The frequency of your blood glucose monitoring.  Lifestyle habits, such as activity level as well as tobacco, alcohol, and substance use. Diabetes self-management education Your health care provider will assess how well you are monitoring your blood glucose levels and whether you are taking your insulin correctly. He or she may refer you to:  A certified diabetes educator to manage your diabetes throughout your life, starting at diagnosis.  A registered dietitian who can create or review your personal nutrition plan.  An exercise specialist who can discuss your activity level and exercise plan. Summary  Managing diabetes (diabetes mellitus) can be complicated. Your diabetes treatment may be managed by a team of health care providers.  Your health care providers follow guidelines in order to help you get the best quality of care.  Standards of care including  having regular physical exams, blood tests, blood pressure monitoring, immunizations, screening tests, and education about how to manage your diabetes.  Your health care providers may also give you more specific instructions based on your individual health. This information is not intended to replace advice given to you by your health care provider. Make sure you discuss any questions you have with your health care provider. Document Released: 01/27/2009 Document Revised: 12/19/2017 Document Reviewed: 12/29/2015 Elsevier Patient Education  2020 [employer].

## 2019-04-01 NOTE — Assessment & Plan Note (Signed)
Rx crestor 10 mg daily for high CV risk. Counseled about options and she wishes to start daily as she would not remember to take weekly. Checking lipid panel today and then will recheck in 3 months.

## 2019-04-01 NOTE — Assessment & Plan Note (Signed)
Reminded to quit and advised that this would lower her risk of heart attack or stroke. She is not sure she can try to quit at this time.

## 2019-04-01 NOTE — Assessment & Plan Note (Signed)
Weight stable but not decreasing, she is working on this.

## 2019-04-01 NOTE — Progress Notes (Signed)
   Subjective:   Patient ID: Cheryl Delgado, female    DOB: 1946-03-15, 73 y.o.   MRN: [id number]  HPI The patient is a 73 YO female coming in for follow up cholesterol (talked to pharmacist yesterday and willing to start statin, risk of CV disease 10 year 36%, denies current stroke or chest pains currently, some rare pressure in chest) and diabetes (taking her amaryl and jardiance and metformin and actos, on ARB, previously not on statin, denies new numbness or tingling in hands or feet) and smoking (still smoking about 1/2 ppd, denies wanting to quit now, has thought about it, denies SOB).   Review of Systems  Constitutional: Negative.   HENT: Negative.   Eyes: Negative.   Respiratory: Positive for chest tightness. Negative for cough and shortness of breath.   Cardiovascular: Negative for chest pain, palpitations and leg swelling.  Gastrointestinal: Negative for abdominal distention, abdominal pain, constipation, diarrhea, nausea and vomiting.  Musculoskeletal: Negative.   Skin: Negative.   Neurological: Negative.   Psychiatric/Behavioral: Negative.     Objective:  Physical Exam Constitutional:      Appearance: She is well-developed. She is obese.  HENT:     Head: Normocephalic and atraumatic.  Cardiovascular:     Rate and Rhythm: Normal rate and regular rhythm.  Pulmonary:     Effort: Pulmonary effort is normal. No respiratory distress.     Breath sounds: Normal breath sounds. No wheezing or rales.  Abdominal:     General: Bowel sounds are normal. There is no distension.     Palpations: Abdomen is soft.     Tenderness: There is no abdominal tenderness. There is no rebound.  Musculoskeletal:     Cervical back: Normal range of motion.  Skin:    General: Skin is warm and dry.  Neurological:     Mental Status: She is alert and oriented to person, place, and time.     Coordination: Coordination normal.     Vitals:   04/01/19 1011  BP: 124/80  Pulse: 66  Temp: 98 F (36.7  C)  TempSrc: Oral  Weight: 227 lb (103 kg)  Height: 5\' 2"  (1.575 m)   EKG: Rate 66, axis normal, interval normal, sinus, no st or t wave changes, no change when compared to prior 2012  This visit occurred during the SARS-CoV-2 public health emergency.  Safety protocols were in place, including screening questions prior to the visit, additional usage of staff PPE, and extensive cleaning of exam room while observing appropriate contact time as indicated for disinfecting solutions.   Assessment & Plan:

## 2019-04-01 NOTE — Assessment & Plan Note (Signed)
Much closer to goal at last visit but still uncontrolled. She is taking jardiance and amaryl and metformin and actos as this time. She does have neuropathy from this which is stable overall. She is prescribed statin today. On ARB. Checking HgA1c and adjust as needed.

## 2019-04-05 ENCOUNTER — Ambulatory Visit
Admission: RE | Admit: 2019-04-05 | Discharge: 2019-04-05 | Disposition: A | Discharge status: Home / Self Care | Payer: Medicare HMO | Source: Ambulatory Visit | Attending: Internal Medicine | Admitting: Internal Medicine

## 2019-04-05 ENCOUNTER — Other Ambulatory Visit: Payer: Self-pay

## 2019-04-05 DIAGNOSIS — Z1231 Encounter for screening mammogram for malignant neoplasm of breast: Secondary | ICD-10-CM

## 2019-04-13 ENCOUNTER — Other Ambulatory Visit: Payer: Self-pay | Admitting: Internal Medicine

## 2019-06-08 ENCOUNTER — Other Ambulatory Visit: Payer: Self-pay | Admitting: Internal Medicine

## 2019-06-27 ENCOUNTER — Other Ambulatory Visit: Payer: Self-pay | Admitting: Internal Medicine

## 2019-06-30 ENCOUNTER — Ambulatory Visit (INDEPENDENT_AMBULATORY_CARE_PROVIDER_SITE_OTHER): Payer: Medicare HMO | Admitting: Internal Medicine

## 2019-06-30 ENCOUNTER — Ambulatory Visit (INDEPENDENT_AMBULATORY_CARE_PROVIDER_SITE_OTHER): Payer: Medicare HMO

## 2019-06-30 ENCOUNTER — Other Ambulatory Visit: Payer: Self-pay

## 2019-06-30 ENCOUNTER — Encounter: Payer: Self-pay | Admitting: Internal Medicine

## 2019-06-30 VITALS — BP 142/90 | HR 80 | Temp 98.3°F | Ht 62.0 in | Wt 223.2 lb

## 2019-06-30 DIAGNOSIS — IMO0002 Reserved for concepts with insufficient information to code with codable children: Secondary | ICD-10-CM

## 2019-06-30 DIAGNOSIS — E1149 Type 2 diabetes mellitus with other diabetic neurological complication: Secondary | ICD-10-CM

## 2019-06-30 DIAGNOSIS — E1165 Type 2 diabetes mellitus with hyperglycemia: Secondary | ICD-10-CM | POA: Diagnosis not present

## 2019-06-30 DIAGNOSIS — M25561 Pain in right knee: Secondary | ICD-10-CM | POA: Diagnosis not present

## 2019-06-30 DIAGNOSIS — G8929 Other chronic pain: Secondary | ICD-10-CM | POA: Diagnosis not present

## 2019-06-30 DIAGNOSIS — E785 Hyperlipidemia, unspecified: Secondary | ICD-10-CM

## 2019-06-30 DIAGNOSIS — E1169 Type 2 diabetes mellitus with other specified complication: Secondary | ICD-10-CM | POA: Diagnosis not present

## 2019-06-30 LAB — COMPREHENSIVE METABOLIC PANEL
ALT: 10 U/L (ref 0–35)
AST: 13 U/L (ref 0–37)
Albumin: 3.8 g/dL (ref 3.5–5.2)
Alkaline Phosphatase: 63 U/L (ref 39–117)
BUN: 20 mg/dL (ref 6–23)
CO2: 33 mEq/L — ABNORMAL HIGH (ref 19–32)
Calcium: 9.5 mg/dL (ref 8.4–10.5)
Chloride: 104 mEq/L (ref 96–112)
Creatinine, Ser: 0.71 mg/dL (ref 0.40–1.20)
GFR: 97.66 mL/min (ref >60.00)
Glucose, Bld: 170 mg/dL — ABNORMAL HIGH (ref 70–99)
Potassium: 4.4 mEq/L (ref 3.5–5.1)
Sodium: 141 mEq/L (ref 135–145)
Total Bilirubin: 0.4 mg/dL (ref 0.2–1.2)
Total Protein: 7 g/dL (ref 6.0–8.3)

## 2019-06-30 LAB — LIPID PANEL
Cholesterol: 124 mg/dL (ref 0–200)
HDL: 63.2 mg/dL (ref >39.00)
LDL Cholesterol: 47 mg/dL (ref 0–99)
NonHDL: 60.89
Total CHOL/HDL Ratio: 2
Triglycerides: 69 mg/dL (ref 0.0–149.0)
VLDL: 13.8 mg/dL (ref 0.0–40.0)

## 2019-06-30 LAB — HEMOGLOBIN A1C: Hgb A1c MFr Bld: 8.4 % — ABNORMAL HIGH (ref 4.6–6.5)

## 2019-06-30 NOTE — Patient Instructions (Signed)
We will check the labs and the x-ray of the knee today.

## 2019-06-30 NOTE — Progress Notes (Signed)
   Subjective:   Patient ID: Cheryl Delgado, female    DOB: May 08, 1945, 74 y.o.   MRN: [id number]  HPI The patient is a 74 YO female coming in for follow up cholesterol (started crestor 10 mg daily about 3 months ago, denies side effects), and diabetes (HgA1c was up at last visit and she was asked to let us know if she was willing to change medications or work on diet and return in 3 months but she did not let us know this, uncontrolled and complicated, denies new tingling or numbness) and right knee pain (hurts most days and worse when sitting for awhile, she denies instability or knee collapse, has tried tylenol without much relief).   Review of Systems  Constitutional: Negative.   HENT: Negative.   Eyes: Negative.   Respiratory: Negative for cough, chest tightness and shortness of breath.   Cardiovascular: Negative for chest pain, palpitations and leg swelling.  Gastrointestinal: Negative for abdominal distention, abdominal pain, constipation, diarrhea, nausea and vomiting.  Musculoskeletal: Positive for arthralgias.  Skin: Negative.   Neurological: Positive for numbness.  Psychiatric/Behavioral: Negative.     Objective:  Physical Exam Constitutional:      Appearance: She is well-developed. She is obese.  HENT:     Head: Normocephalic and atraumatic.  Cardiovascular:     Rate and Rhythm: Normal rate and regular rhythm.  Pulmonary:     Effort: Pulmonary effort is normal. No respiratory distress.     Breath sounds: Normal breath sounds. No wheezing or rales.  Abdominal:     General: Bowel sounds are normal. There is no distension.     Palpations: Abdomen is soft.     Tenderness: There is no abdominal tenderness. There is no rebound.  Musculoskeletal:        General: Tenderness present.     Cervical back: Normal range of motion.  Skin:    General: Skin is warm and dry.  Neurological:     Mental Status: She is alert and oriented to person, place, and time.     Coordination:  Coordination normal.     Vitals:   06/30/19 1015  BP: (!) 142/90  Pulse: 80  Temp: 98.3 F (36.8 C)  TempSrc: Oral  SpO2: 99%  Weight: 223 lb 4 oz (101.3 kg)  Height: 5\' 2"  (1.575 m)    This visit occurred during the SARS-CoV-2 public health emergency.  Safety protocols were in place, including screening questions prior to the visit, additional usage of staff PPE, and extensive cleaning of exam room while observing appropriate contact time as indicated for disinfecting solutions.   Assessment & Plan:

## 2019-07-01 ENCOUNTER — Other Ambulatory Visit: Payer: Self-pay | Admitting: Internal Medicine

## 2019-07-01 DIAGNOSIS — G8929 Other chronic pain: Secondary | ICD-10-CM | POA: Insufficient documentation

## 2019-07-01 DIAGNOSIS — E2839 Other primary ovarian failure: Secondary | ICD-10-CM

## 2019-07-01 NOTE — Assessment & Plan Note (Signed)
Checking x-ray and refer to sports medicine for treatment.

## 2019-07-01 NOTE — Assessment & Plan Note (Signed)
Checking HgA1c and adjust as needed. Not at goal previously but improved from several years ago. Taking actos, metformin, and jardiance and amaryl. Complicated by neuropathy.

## 2019-07-01 NOTE — Assessment & Plan Note (Signed)
Checking lipid panel since on crestor for 3 months. Adjust as needed for goal LDL <70.

## 2019-07-06 ENCOUNTER — Telehealth: Payer: Self-pay | Admitting: Internal Medicine

## 2019-07-06 NOTE — Telephone Encounter (Signed)
° °  Scheduler left message voicemail to schedule Dexa

## 2019-07-15 ENCOUNTER — Other Ambulatory Visit: Payer: Self-pay

## 2019-07-15 ENCOUNTER — Ambulatory Visit (INDEPENDENT_AMBULATORY_CARE_PROVIDER_SITE_OTHER)
Admission: RE | Admit: 2019-07-15 | Discharge: 2019-07-15 | Disposition: A | Discharge status: Home / Self Care | Payer: Medicare HMO | Source: Ambulatory Visit | Attending: Internal Medicine | Admitting: Internal Medicine

## 2019-07-15 DIAGNOSIS — E2839 Other primary ovarian failure: Secondary | ICD-10-CM

## 2019-08-04 ENCOUNTER — Other Ambulatory Visit: Payer: Self-pay | Admitting: Internal Medicine

## 2019-09-14 ENCOUNTER — Other Ambulatory Visit: Payer: Self-pay | Admitting: Internal Medicine

## 2019-09-25 ENCOUNTER — Other Ambulatory Visit: Payer: Self-pay | Admitting: Internal Medicine

## 2019-09-30 ENCOUNTER — Ambulatory Visit (INDEPENDENT_AMBULATORY_CARE_PROVIDER_SITE_OTHER): Payer: Medicare HMO | Admitting: Internal Medicine

## 2019-09-30 ENCOUNTER — Other Ambulatory Visit: Payer: Self-pay

## 2019-09-30 ENCOUNTER — Encounter: Payer: Self-pay | Admitting: Internal Medicine

## 2019-09-30 VITALS — BP 122/82 | HR 83 | Temp 98.1°F | Ht 62.0 in | Wt 225.0 lb

## 2019-09-30 DIAGNOSIS — E1149 Type 2 diabetes mellitus with other diabetic neurological complication: Secondary | ICD-10-CM | POA: Diagnosis not present

## 2019-09-30 DIAGNOSIS — E1165 Type 2 diabetes mellitus with hyperglycemia: Secondary | ICD-10-CM | POA: Diagnosis not present

## 2019-09-30 DIAGNOSIS — I10 Essential (primary) hypertension: Secondary | ICD-10-CM | POA: Diagnosis not present

## 2019-09-30 DIAGNOSIS — IMO0002 Reserved for concepts with insufficient information to code with codable children: Secondary | ICD-10-CM

## 2019-09-30 LAB — POCT GLYCOSYLATED HEMOGLOBIN (HGB A1C): Hemoglobin A1C: 8 % — AB (ref 4.0–5.6)

## 2019-09-30 NOTE — Assessment & Plan Note (Signed)
BP at goal on her losartan and torsemide. Recent BMP reviewed with her and no indication for changes.

## 2019-09-30 NOTE — Assessment & Plan Note (Signed)
Weight is overall down from last year. Encouraged to continue with dietary changes to help weight decrease.

## 2019-09-30 NOTE — Patient Instructions (Signed)
Keep up the good work with the diet.   Work on adding some exercise 3 times a week if you are able.

## 2019-09-30 NOTE — Progress Notes (Signed)
   Subjective:   Patient ID: Cheryl Delgado, female    DOB: 08/10/1945, 74 y.o.   MRN: [id number]  HPI The patient is a 74 YO female coming in for follow up of her uncontrolled diabetes. She had high HgA1c last time and elected to make dietary changes. She has been able to make some changes since last visit. Still not exercising much due to chronic pain. Is seeing her eye specialist and they recently did cataract and doing other in September. Is taking her medications as prescribed and denies side effects. Stable neuropathy in her feet but not worse or spreading. Still with swelling right more than left associated with her knee problems.   Review of Systems  Constitutional: Negative.   HENT: Negative.   Eyes: Negative.   Respiratory: Negative for cough, chest tightness and shortness of breath.   Cardiovascular: Negative for chest pain, palpitations and leg swelling.  Gastrointestinal: Negative for abdominal distention, abdominal pain, constipation, diarrhea, nausea and vomiting.  Musculoskeletal: Negative.   Skin: Negative.   Neurological: Negative.   Psychiatric/Behavioral: Negative.     Objective:  Physical Exam Constitutional:      Appearance: She is well-developed. She is obese.  HENT:     Head: Normocephalic and atraumatic.  Cardiovascular:     Rate and Rhythm: Normal rate and regular rhythm.  Pulmonary:     Effort: Pulmonary effort is normal. No respiratory distress.     Breath sounds: Normal breath sounds. No wheezing or rales.  Abdominal:     General: Bowel sounds are normal. There is no distension.     Palpations: Abdomen is soft.     Tenderness: There is no abdominal tenderness. There is no rebound.  Musculoskeletal:     Cervical back: Normal range of motion.  Skin:    General: Skin is warm and dry.     Comments: See foot exam  Neurological:     Mental Status: She is alert and oriented to person, place, and time.     Coordination: Coordination normal.     Vitals:    09/30/19 0957  BP: 122/82  Pulse: 83  Temp: 98.1 F (36.7 C)  TempSrc: Oral  SpO2: 92%  Weight: 225 lb (102.1 kg)  Height: 5\' 2"  (1.575 m)    This visit occurred during the SARS-CoV-2 public health emergency.  Safety protocols were in place, including screening questions prior to the visit, additional usage of staff PPE, and extensive cleaning of exam room while observing appropriate contact time as indicated for disinfecting solutions.   Assessment & Plan:

## 2019-09-30 NOTE — Assessment & Plan Note (Signed)
POC HgA1c done today at 8.0. That is acceptable given her prior readings. She is not wanting to advance medications or start insulin unless absolutely necessary. Keep amaryl, metformin, actos, jardiance. On ARB and statin. Complicated by neuropathy and retinopathy. Foot exam done.

## 2019-10-18 ENCOUNTER — Other Ambulatory Visit: Payer: Self-pay | Admitting: Internal Medicine

## 2019-10-18 NOTE — Telephone Encounter (Signed)
Please refill as per office routine med refill policy (all routine meds refilled for 3 mo or monthly per pt preference up to one year from last visit, then month to month grace period for 3 mo, then further med refills will have to be denied)  

## 2019-11-03 ENCOUNTER — Other Ambulatory Visit: Payer: Self-pay | Admitting: Internal Medicine

## 2019-11-03 NOTE — Telephone Encounter (Signed)
Please refill as per office routine med refill policy (all routine meds refilled for 3 mo or monthly per pt preference up to one year from last visit, then month to month grace period for 3 mo, then further med refills will have to be denied)  

## 2019-12-27 ENCOUNTER — Other Ambulatory Visit: Payer: Self-pay | Admitting: Internal Medicine

## 2020-03-14 ENCOUNTER — Encounter: Payer: Self-pay | Admitting: Internal Medicine

## 2020-03-14 ENCOUNTER — Ambulatory Visit (INDEPENDENT_AMBULATORY_CARE_PROVIDER_SITE_OTHER): Payer: Medicare HMO | Admitting: Internal Medicine

## 2020-03-14 ENCOUNTER — Other Ambulatory Visit: Payer: Self-pay

## 2020-03-14 VITALS — BP 138/70 | HR 84 | Temp 98.1°F | Ht 62.0 in | Wt 225.2 lb

## 2020-03-14 DIAGNOSIS — IMO0002 Reserved for concepts with insufficient information to code with codable children: Secondary | ICD-10-CM

## 2020-03-14 DIAGNOSIS — E1165 Type 2 diabetes mellitus with hyperglycemia: Secondary | ICD-10-CM | POA: Diagnosis not present

## 2020-03-14 DIAGNOSIS — E1149 Type 2 diabetes mellitus with other diabetic neurological complication: Secondary | ICD-10-CM | POA: Diagnosis not present

## 2020-03-14 DIAGNOSIS — L03115 Cellulitis of right lower limb: Secondary | ICD-10-CM | POA: Diagnosis not present

## 2020-03-14 DIAGNOSIS — L02415 Cutaneous abscess of right lower limb: Secondary | ICD-10-CM

## 2020-03-14 DIAGNOSIS — Z23 Encounter for immunization: Secondary | ICD-10-CM

## 2020-03-14 LAB — POCT GLYCOSYLATED HEMOGLOBIN (HGB A1C): Hemoglobin A1C: 8.1 % — AB (ref 4.0–5.6)

## 2020-03-14 MED ORDER — SULFAMETHOXAZOLE-TRIMETHOPRIM 800-160 MG PO TABS: 1.0000 | ORAL_TABLET | Freq: Two times a day (BID) | ORAL | 0 refills | Status: DC

## 2020-03-14 NOTE — Patient Instructions (Signed)
We have called in bactrim to take 1 pill twice a day for 10 days.   If you are not getting any better in 1-2 days let us know and we may need to send you to the hospital.

## 2020-03-14 NOTE — Progress Notes (Signed)
   Subjective:   Patient ID: Cheryl Delgado, female    DOB: 10/29/1945, 74 y.o.   MRN: [id number]  HPI The patient is a 74 YO female coming in for concerns about right leg swelling, redness and pain. She noticed a spot on her toe a few weeks ago. In the last few days she noticed her lower right leg getting swollen and then red. It is warm to the touch and painful. Denies fevers or chills. Maybe has felt a little off. She does have diabetes and is close to goal with sugars. Overall worsening. Has tried propping her legs which has not helped.   Review of Systems  Constitutional: Negative.   HENT: Negative.   Eyes: Negative.   Respiratory: Negative for cough, chest tightness and shortness of breath.   Cardiovascular: Negative for chest pain, palpitations and leg swelling.  Gastrointestinal: Negative for abdominal distention, abdominal pain, constipation, diarrhea, nausea and vomiting.  Musculoskeletal: Positive for myalgias.  Skin: Positive for rash.  Neurological: Negative.   Psychiatric/Behavioral: Negative.     Objective:  Physical Exam Constitutional:      Appearance: She is well-developed. She is obese.  HENT:     Head: Normocephalic and atraumatic.  Cardiovascular:     Rate and Rhythm: Normal rate and regular rhythm.  Pulmonary:     Effort: Pulmonary effort is normal. No respiratory distress.     Breath sounds: Normal breath sounds. No wheezing or rales.  Abdominal:     General: Bowel sounds are normal. There is no distension.     Palpations: Abdomen is soft.     Tenderness: There is no abdominal tenderness. There is no rebound.  Musculoskeletal:        General: Tenderness present.     Cervical back: Normal range of motion.     Right lower leg: Edema present.     Left lower leg: Edema present.     Comments: Right leg swelling more than left. Right lower leg with redness and warm to touch consistent with cellulitis. There is a almost healed wound on the right foot near 1st toe   Skin:    General: Skin is warm and dry.  Neurological:     Mental Status: She is alert and oriented to person, place, and time.     Coordination: Coordination normal.     Vitals:   03/14/20 1350  BP: 138/70  Pulse: 84  Temp: 98.1 F (36.7 C)  TempSrc: Oral  SpO2: 99%  Weight: 225 lb 3.2 oz (102.2 kg)  Height: 5\' 2"  (1.575 m)    This visit occurred during the SARS-CoV-2 public health emergency.  Safety protocols were in place, including screening questions prior to the visit, additional usage of staff PPE, and extensive cleaning of exam room while observing appropriate contact time as indicated for disinfecting solutions.   Visit time 25 minutes in face to face communication with patient and coordination of care, additional 5 minutes spent in record review, coordination or care, ordering tests, communicating/referring to other healthcare professionals, documenting in medical records all on the same day of the visit for total time 30 minutes spent on the visit.    Assessment & Plan:  Flu shot given at visit

## 2020-03-17 ENCOUNTER — Encounter: Payer: Self-pay | Admitting: Internal Medicine

## 2020-03-17 DIAGNOSIS — L03115 Cellulitis of right lower limb: Secondary | ICD-10-CM | POA: Insufficient documentation

## 2020-03-17 DIAGNOSIS — L02415 Cutaneous abscess of right lower limb: Secondary | ICD-10-CM | POA: Insufficient documentation

## 2020-03-17 NOTE — Assessment & Plan Note (Signed)
Rx bactrim and needs close follow up due to concurrent diabetes. Likely originated from chronic edema and wound on right foot which is now almost healed.

## 2020-03-17 NOTE — Assessment & Plan Note (Signed)
POC HgA1c done today to ensure no change in control. Hga1c 8.1 and will maintain therapy as this is likely as close to goal as she will get without additional pill burden. Keep jardiance and metformin and actos. On ARB and statin.

## 2020-03-18 ENCOUNTER — Other Ambulatory Visit: Payer: Self-pay | Admitting: Internal Medicine

## 2020-03-29 ENCOUNTER — Other Ambulatory Visit: Payer: Self-pay

## 2020-03-29 ENCOUNTER — Ambulatory Visit (INDEPENDENT_AMBULATORY_CARE_PROVIDER_SITE_OTHER): Payer: Medicare HMO | Admitting: Internal Medicine

## 2020-03-29 ENCOUNTER — Encounter: Payer: Self-pay | Admitting: Internal Medicine

## 2020-03-29 VITALS — BP 114/62 | HR 67 | Temp 98.5°F | Resp 16 | Ht 62.0 in | Wt 222.6 lb

## 2020-03-29 DIAGNOSIS — L02415 Cutaneous abscess of right lower limb: Secondary | ICD-10-CM

## 2020-03-29 DIAGNOSIS — L03115 Cellulitis of right lower limb: Secondary | ICD-10-CM

## 2020-03-29 MED ORDER — SULFAMETHOXAZOLE-TRIMETHOPRIM 800-160 MG PO TABS: 1.0000 | ORAL_TABLET | Freq: Two times a day (BID) | ORAL | 0 refills | Status: AC

## 2020-03-29 MED ORDER — KETOCONAZOLE 2 % EX CREA: 1.0000 "application " | TOPICAL_CREAM | Freq: Every day | CUTANEOUS | 0 refills | Status: DC

## 2020-03-29 NOTE — Assessment & Plan Note (Signed)
Overall improving. Given diabetes needs close follow up and additional 2 weeks of bactrim which is prescribed today. Offered [place] leg to rule out DVT but she wishes to wait until another 2 weeks of antibiotics and if no further improvement she agrees to check this. Prior US 2017 for leg swelling negative for DVT.

## 2020-03-29 NOTE — Progress Notes (Signed)
   Subjective:   Patient ID: Cheryl Delgado, female    DOB: February 22, 1946, 74 y.o.   MRN: [id number]  HPI The patient is a 74 YO female coming in for follow up right leg cellulitis. She was seen about 2 weeks ago and given 10 day course bactrim. She did have some improvement in the symptoms from that. She denies side effects. Still having increased swelling in the right leg and tenderness. The redness is present but improved overall with less red area and less red in the area still present.   Review of Systems  Constitutional: Negative.   HENT: Negative.   Eyes: Negative.   Respiratory: Negative for cough, chest tightness and shortness of breath.   Cardiovascular: Positive for leg swelling. Negative for chest pain and palpitations.  Gastrointestinal: Negative for abdominal distention, abdominal pain, constipation, diarrhea, nausea and vomiting.  Musculoskeletal: Positive for myalgias.  Skin: Positive for rash.  Neurological: Negative.   Psychiatric/Behavioral: Negative.     Objective:  Physical Exam Constitutional:      Appearance: She is well-developed and well-nourished.  HENT:     Head: Normocephalic and atraumatic.  Eyes:     Extraocular Movements: EOM normal.  Cardiovascular:     Rate and Rhythm: Normal rate and regular rhythm.  Pulmonary:     Effort: Pulmonary effort is normal. No respiratory distress.     Breath sounds: Normal breath sounds. No wheezing or rales.  Abdominal:     General: Bowel sounds are normal. There is no distension.     Palpations: Abdomen is soft.     Tenderness: There is no abdominal tenderness. There is no rebound.  Musculoskeletal:     Cervical back: Normal range of motion.     Right lower leg: Edema present.     Comments: Right lower leg with edema, redness on the lateral region which is reduced from prior whole lower leg with redness. Some tenderness to palpation over the calf and lateral region. Swelling is stable from prior.   Skin:    General:  Skin is warm and dry.  Neurological:     Mental Status: She is alert and oriented to person, place, and time.     Coordination: Coordination normal.  Psychiatric:        Mood and Affect: Mood and affect normal.     Vitals:   03/29/20 1000  BP: 114/62  Pulse: 67  Resp: 16  Temp: 98.5 F (36.9 C)  TempSrc: Oral  SpO2: 98%  Weight: 222 lb 9.6 oz (101 kg)  Height: 5\' 2"  (1.575 m)    This visit occurred during the SARS-CoV-2 public health emergency.  Safety protocols were in place, including screening questions prior to the visit, additional usage of staff PPE, and extensive cleaning of exam room while observing appropriate contact time as indicated for disinfecting solutions.   Assessment & Plan:

## 2020-03-29 NOTE — Patient Instructions (Signed)
We have sent in another 2 weeks of the bactrim to take 1 pill twice a day.   Come back in 2 weeks so we can make sure this is getting better and call us sooner if it is getting worse.

## 2020-04-03 ENCOUNTER — Other Ambulatory Visit: Payer: Self-pay | Admitting: Internal Medicine

## 2020-04-12 ENCOUNTER — Encounter: Payer: Self-pay | Admitting: Internal Medicine

## 2020-04-12 ENCOUNTER — Other Ambulatory Visit: Payer: Self-pay

## 2020-04-12 ENCOUNTER — Ambulatory Visit (INDEPENDENT_AMBULATORY_CARE_PROVIDER_SITE_OTHER): Payer: Medicare HMO | Admitting: Internal Medicine

## 2020-04-12 VITALS — BP 120/78 | HR 76 | Temp 98.4°F | Ht 62.0 in | Wt 222.0 lb

## 2020-04-12 DIAGNOSIS — L02415 Cutaneous abscess of right lower limb: Secondary | ICD-10-CM

## 2020-04-12 DIAGNOSIS — E1165 Type 2 diabetes mellitus with hyperglycemia: Secondary | ICD-10-CM | POA: Diagnosis not present

## 2020-04-12 DIAGNOSIS — E1149 Type 2 diabetes mellitus with other diabetic neurological complication: Secondary | ICD-10-CM | POA: Diagnosis not present

## 2020-04-12 DIAGNOSIS — L03115 Cellulitis of right lower limb: Secondary | ICD-10-CM | POA: Diagnosis not present

## 2020-04-12 DIAGNOSIS — IMO0002 Reserved for concepts with insufficient information to code with codable children: Secondary | ICD-10-CM

## 2020-04-12 NOTE — Assessment & Plan Note (Signed)
Overall resolved. She will finish the last pill of bactrim and then stop. Given her diabetes she will monitor closely for recurrence off antibiotics and call for any worsening redness, pain, swelling. If any of those will need further antibiotics. Has completed 1 month bactrim and would try keflex if needed further antibiotics.

## 2020-04-12 NOTE — Patient Instructions (Signed)
You can finish the antibiotics and then stop.  Please call if the leg starts hurting again or getting red or swollen more.  Go to vaccines.gov or call your local pharmacy.

## 2020-04-12 NOTE — Progress Notes (Signed)
   Subjective:   Patient ID: Cheryl Delgado, female    DOB: [birth year], 74 y.o.   MRN: [id number]  HPI The patient is a 74 YO female coming in for follow up of her right leg. She had cellulitis on it for the last month or so. Overall it is improving. She is still taking bactrim and has 1 pill left. Overall she feels it is almost better. Not hurting but still mildly tender to touch. Denies fevers or chills. Redness is almost gone. Still skin in the area is darker than surrounding. Has concurrent diabetes which is stable.   Review of Systems  Constitutional: Negative.   HENT: Negative.   Eyes: Negative.   Respiratory: Negative for cough, chest tightness and shortness of breath.   Cardiovascular: Negative for chest pain, palpitations and leg swelling.  Gastrointestinal: Negative for abdominal distention, abdominal pain, constipation, diarrhea, nausea and vomiting.  Musculoskeletal: Negative.   Skin: Positive for color change.  Neurological: Negative.   Psychiatric/Behavioral: Negative.     Objective:  Physical Exam Constitutional:      Appearance: She is well-developed and well-nourished.  HENT:     Head: Normocephalic and atraumatic.  Eyes:     Extraocular Movements: EOM normal.  Cardiovascular:     Rate and Rhythm: Normal rate and regular rhythm.  Pulmonary:     Effort: Pulmonary effort is normal. No respiratory distress.     Breath sounds: Normal breath sounds. No wheezing or rales.  Abdominal:     General: Bowel sounds are normal. There is no distension.     Palpations: Abdomen is soft.     Tenderness: There is no abdominal tenderness. There is no rebound.  Musculoskeletal:        General: No edema.     Cervical back: Normal range of motion.     Comments: No tenderness on the right shin, skin still darker than surrounding. No heat or redness. Overall improved from prior.  Skin:    General: Skin is warm and dry.  Neurological:     Mental Status: She is alert and oriented to  person, place, and time.     Coordination: Coordination normal.  Psychiatric:        Mood and Affect: Mood and affect normal.     Vitals:   04/12/20 0903  BP: 120/78  Pulse: 76  Temp: 98.4 F (36.9 C)  TempSrc: Oral  SpO2: 99%  Weight: 222 lb (100.7 kg)  Height: 5\' 2"  (1.575 m)    This visit occurred during the SARS-CoV-2 public health emergency.  Safety protocols were in place, including screening questions prior to the visit, additional usage of staff PPE, and extensive cleaning of exam room while observing appropriate contact time as indicated for disinfecting solutions.   Assessment & Plan:

## 2020-04-12 NOTE — Assessment & Plan Note (Signed)
Stable and she is very close to goal on her jardiance, amaryl and metformin and actos. No high or low sugars currently but stable complications.

## 2020-05-09 ENCOUNTER — Other Ambulatory Visit: Payer: Self-pay | Admitting: Internal Medicine

## 2020-05-09 NOTE — Telephone Encounter (Signed)
Please refill as per office routine med refill policy (all routine meds refilled for 3 mo or monthly per pt preference up to one year from last visit, then month to month grace period for 3 mo, then further med refills will have to be denied)  

## 2020-05-11 ENCOUNTER — Other Ambulatory Visit: Payer: Self-pay | Admitting: Internal Medicine

## 2020-05-11 NOTE — Telephone Encounter (Signed)
Ok forward to pcp 

## 2020-06-05 ENCOUNTER — Other Ambulatory Visit: Payer: Self-pay | Admitting: Internal Medicine

## 2020-06-22 ENCOUNTER — Telehealth: Payer: Self-pay | Admitting: Internal Medicine

## 2020-06-29 MED ORDER — METFORMIN HCL 1000 MG PO TABS: 1000.0000 mg | ORAL_TABLET | Freq: Two times a day (BID) | ORAL | 0 refills | Status: DC

## 2020-06-29 NOTE — Addendum Note (Signed)
Addended by: Thomes Cake on: 06/29/2020 04:45 PM   Modules accepted: Orders

## 2020-06-29 NOTE — Telephone Encounter (Signed)
Patient called and is requesting a refill for metFORMIN (GLUCOPHAGE) 1000 MG tablet with a 90 day supply be sent to [facility], [address] AT [address]. She can be reached at [phone]

## 2020-06-29 NOTE — Telephone Encounter (Signed)
Medication has been sent to the patient's pharmacy.  

## 2020-07-10 ENCOUNTER — Other Ambulatory Visit: Payer: Self-pay | Admitting: Internal Medicine

## 2020-07-20 ENCOUNTER — Ambulatory Visit (INDEPENDENT_AMBULATORY_CARE_PROVIDER_SITE_OTHER): Payer: Medicare HMO | Admitting: Internal Medicine

## 2020-07-20 ENCOUNTER — Other Ambulatory Visit: Payer: Self-pay

## 2020-07-20 ENCOUNTER — Encounter: Payer: Self-pay | Admitting: Internal Medicine

## 2020-07-20 VITALS — BP 130/70 | HR 92 | Temp 98.9°F | Resp 18 | Ht 62.0 in | Wt 224.6 lb

## 2020-07-20 DIAGNOSIS — F17210 Nicotine dependence, cigarettes, uncomplicated: Secondary | ICD-10-CM | POA: Diagnosis not present

## 2020-07-20 DIAGNOSIS — Z1211 Encounter for screening for malignant neoplasm of colon: Secondary | ICD-10-CM

## 2020-07-20 DIAGNOSIS — E1149 Type 2 diabetes mellitus with other diabetic neurological complication: Secondary | ICD-10-CM | POA: Diagnosis not present

## 2020-07-20 DIAGNOSIS — Z0001 Encounter for general adult medical examination with abnormal findings: Secondary | ICD-10-CM | POA: Diagnosis not present

## 2020-07-20 DIAGNOSIS — E1165 Type 2 diabetes mellitus with hyperglycemia: Secondary | ICD-10-CM

## 2020-07-20 DIAGNOSIS — E785 Hyperlipidemia, unspecified: Secondary | ICD-10-CM

## 2020-07-20 DIAGNOSIS — I10 Essential (primary) hypertension: Secondary | ICD-10-CM

## 2020-07-20 DIAGNOSIS — E1169 Type 2 diabetes mellitus with other specified complication: Secondary | ICD-10-CM

## 2020-07-20 DIAGNOSIS — I872 Venous insufficiency (chronic) (peripheral): Secondary | ICD-10-CM

## 2020-07-20 DIAGNOSIS — IMO0002 Reserved for concepts with insufficient information to code with codable children: Secondary | ICD-10-CM

## 2020-07-20 NOTE — Patient Instructions (Addendum)
You can get the booster shot for covid-19 in June.  We will get the cologuard sent to your house.    Health Maintenance, Female Adopting a healthy lifestyle and getting preventive care are important in promoting health and wellness. Ask your health care provider about:  The right schedule for you to have regular tests and exams.  Things you can do on your own to prevent diseases and keep yourself healthy. What should I know about diet, weight, and exercise? Eat a healthy diet  Eat a diet that includes plenty of vegetables, fruits, low-fat dairy products, and lean protein.  Do not eat a lot of foods that are high in solid fats, added sugars, or sodium.   Maintain a healthy weight Body mass index (BMI) is used to identify weight problems. It estimates body fat based on height and weight. Your health care provider can help determine your BMI and help you achieve or maintain a healthy weight. Get regular exercise Get regular exercise. This is one of the most important things you can do for your health. Most adults should:  Exercise for at least 150 minutes each week. The exercise should increase your heart rate and make you sweat (moderate-intensity exercise).  Do strengthening exercises at least twice a week. This is in addition to the moderate-intensity exercise.  Spend less time sitting. Even light physical activity can be beneficial. Watch cholesterol and blood lipids Have your blood tested for lipids and cholesterol at 74 years of age, then have this test every 5 years. Have your cholesterol levels checked more often if:  Your lipid or cholesterol levels are high.  You are older than 74 years of age.  You are at high risk for heart disease. What should I know about cancer screening? Depending on your health history and family history, you may need to have cancer screening at various ages. This may include screening for:  Breast cancer.  Cervical cancer.  Colorectal  cancer.  Skin cancer.  Lung cancer. What should I know about heart disease, diabetes, and high blood pressure? Blood pressure and heart disease  High blood pressure causes heart disease and increases the risk of stroke. This is more likely to develop in people who have high blood pressure readings, are of African descent, or are overweight.  Have your blood pressure checked: ? Every 3-5 years if you are 69-17 years of age. ? Every year if you are 72 years old or older. Diabetes Have regular diabetes screenings. This checks your fasting blood sugar level. Have the screening done:  Once every three years after age 29 if you are at a normal weight and have a low risk for diabetes.  More often and at a younger age if you are overweight or have a high risk for diabetes. What should I know about preventing infection? Hepatitis B If you have a higher risk for hepatitis B, you should be screened for this virus. Talk with your health care provider to find out if you are at risk for hepatitis B infection. Hepatitis C Testing is recommended for:  Everyone born from [birth year] through 1964.  Anyone with known risk factors for hepatitis C. Sexually transmitted infections (STIs)  Get screened for STIs, including gonorrhea and chlamydia, if: ? You are sexually active and are younger than 74 years of age. ? You are older than 74 years of age and your health care provider tells you that you are at risk for this type of infection. ? Your sexual  activity has changed since you were last screened, and you are at increased risk for chlamydia or gonorrhea. Ask your health care provider if you are at risk.  Ask your health care provider about whether you are at high risk for HIV. Your health care provider may recommend a prescription medicine to help prevent HIV infection. If you choose to take medicine to prevent HIV, you should first get tested for HIV. You should then be tested every 3 months for as long as  you are taking the medicine. Pregnancy  If you are about to stop having your period (premenopausal) and you may become pregnant, seek counseling before you get pregnant.  Take 400 to 800 micrograms (mcg) of folic acid every day if you become pregnant.  Ask for birth control (contraception) if you want to prevent pregnancy. Osteoporosis and menopause Osteoporosis is a disease in which the bones lose minerals and strength with aging. This can result in bone fractures. If you are 36 years old or older, or if you are at risk for osteoporosis and fractures, ask your health care provider if you should:  Be screened for bone loss.  Take a calcium or vitamin D supplement to lower your risk of fractures.  Be given hormone replacement therapy (HRT) to treat symptoms of menopause. Follow these instructions at home: Lifestyle  Do not use any products that contain nicotine or tobacco, such as cigarettes, e-cigarettes, and chewing tobacco. If you need help quitting, ask your health care provider.  Do not use street drugs.  Do not share needles.  Ask your health care provider for help if you need support or information about quitting drugs. Alcohol use  Do not drink alcohol if: ? Your health care provider tells you not to drink. ? You are pregnant, may be pregnant, or are planning to become pregnant.  If you drink alcohol: ? Limit how much you use to 0-1 drink a day. ? Limit intake if you are breastfeeding.  Be aware of how much alcohol is in your drink. In the [place] one drink equals one 12 oz bottle of beer (355 mL), one 5 oz glass of wine (148 mL), or one 1 oz glass of hard liquor (44 mL). General instructions  Schedule regular health, dental, and eye exams.  Stay current with your vaccines.  Tell your health care provider if: ? You often feel depressed. ? You have ever been abused or do not feel safe at home. Summary  Adopting a healthy lifestyle and getting preventive care are  important in promoting health and wellness.  Follow your health care provider's instructions about healthy diet, exercising, and getting tested or screened for diseases.  Follow your health care provider's instructions on monitoring your cholesterol and blood pressure. This information is not intended to replace advice given to you by your health care provider. Make sure you discuss any questions you have with your health care provider. Document Revised: 03/25/2018 Document Reviewed: 03/25/2018 Elsevier Patient Education  2021 [employer].

## 2020-07-20 NOTE — Progress Notes (Signed)
Subjective:   Patient ID: Cheryl Delgado, female    DOB: 12/03/1945, 75 y.o.   MRN: [id number]  HPI Here for medicare wellness and physical, no new complaints. Please see A/P for status and treatment of chronic medical problems.   Diet: DM since diabetic Physical activity: sedentary Depression/mood screen: negative Hearing: intact to whispered voice Visual acuity: grossly normal, overdue for eye exam last about 4 months ago ADLs: capable Fall risk: none Home safety: good Cognitive evaluation: intact to orientation, naming, recall and repetition EOL planning: adv directives discussed  [place] Visit from 03/14/2020 in [place] at [facility] Total Score 0      I have personally reviewed and have noted 1. The patient's medical and social history - reviewed today no changes 2. Their use of alcohol, tobacco or illicit drugs 3. Their current medications and supplements 4. The patient's functional ability including ADL's, fall risks, home safety risks and hearing or visual impairment. 5. Diet and physical activities 6. Evidence for depression or mood disorders 7. Care team reviewed and updated 8.  The patient is not on an opioid pain medication.  Patient Care Team: Hoyt Koch, MD as PCP - General (Internal Medicine) Prentiss Bells, MD (Ophthalmology) Past Medical History:  Diagnosis Date  . Chest pain, unspecified   . Edema    peripheral  . Hypertension   . Type II or unspecified type diabetes mellitus without mention of complication, not stated as uncontrolled   . Vertigo    Past Surgical History:  Procedure Laterality Date  . COLONOSCOPY  [id number]  . dialtion and curettage    . TOTAL ABDOMINAL HYSTERECTOMY W/ BILATERAL SALPINGOOPHORECTOMY     due to fibroid tumors and uterine bleeding   Family History  Problem Relation Age of Onset  . Hyperlipidemia Mother   . Diabetes Mother   . Heart disease Mother   . Breast cancer Neg  Hx   . Colon cancer Neg Hx   . Stroke Neg Hx   . Amblyopia Neg Hx   . Blindness Neg Hx   . Cataracts Neg Hx   . Glaucoma Neg Hx   . Macular degeneration Neg Hx   . Retinal detachment Neg Hx   . Strabismus Neg Hx   . Retinitis pigmentosa Neg Hx    EKG: Rate 78, axis normal, interval normal, sinus, no st or t wave changes, no significant change from 2020  Review of Systems  Constitutional: Negative.   HENT: Negative.   Eyes: Negative.   Respiratory: Negative for cough, chest tightness and shortness of breath.   Cardiovascular: Negative for chest pain, palpitations and leg swelling.  Gastrointestinal: Negative for abdominal distention, abdominal pain, constipation, diarrhea, nausea and vomiting.  Musculoskeletal: Positive for arthralgias.  Skin: Negative.   Neurological: Negative.   Psychiatric/Behavioral: Negative.     Objective:  Physical Exam Constitutional:      Appearance: She is well-developed.  HENT:     Head: Normocephalic and atraumatic.  Cardiovascular:     Rate and Rhythm: Normal rate and regular rhythm.  Pulmonary:     Effort: Pulmonary effort is normal. No respiratory distress.     Breath sounds: Normal breath sounds. No wheezing or rales.  Abdominal:     General: Bowel sounds are normal. There is no distension.     Palpations: Abdomen is soft.     Tenderness: There is no abdominal tenderness. There is no rebound.  Musculoskeletal:     Cervical  back: Normal range of motion.  Skin:    General: Skin is warm and dry.  Neurological:     Mental Status: She is alert and oriented to person, place, and time.     Coordination: Coordination normal.     Vitals:   07/20/20 1553  BP: 130/70  Pulse: 92  Resp: 18  Temp: 98.9 F (37.2 C)  TempSrc: Oral  SpO2: 98%  Weight: 224 lb 9.6 oz (101.9 kg)  Height: 5\' 2"  (1.575 m)    This visit occurred during the SARS-CoV-2 public health emergency.  Safety protocols were in place, including screening questions prior  to the visit, additional usage of staff PPE, and extensive cleaning of exam room while observing appropriate contact time as indicated for disinfecting solutions.   Assessment & Plan:

## 2020-07-21 LAB — COMPREHENSIVE METABOLIC PANEL
ALT: 8 U/L (ref 0–35)
AST: 13 U/L (ref 0–37)
Albumin: 4 g/dL (ref 3.5–5.2)
Alkaline Phosphatase: 58 U/L (ref 39–117)
BUN: 28 mg/dL — ABNORMAL HIGH (ref 6–23)
CO2: 33 mEq/L — ABNORMAL HIGH (ref 19–32)
Calcium: 9.9 mg/dL (ref 8.4–10.5)
Chloride: 101 mEq/L (ref 96–112)
Creatinine, Ser: 0.98 mg/dL (ref 0.40–1.20)
GFR: 57.06 mL/min — ABNORMAL LOW (ref >60.00)
Glucose, Bld: 129 mg/dL — ABNORMAL HIGH (ref 70–99)
Potassium: 4.3 mEq/L (ref 3.5–5.1)
Sodium: 140 mEq/L (ref 135–145)
Total Bilirubin: 0.4 mg/dL (ref 0.2–1.2)
Total Protein: 7.1 g/dL (ref 6.0–8.3)

## 2020-07-21 LAB — LIPID PANEL
Cholesterol: 129 mg/dL (ref 0–200)
HDL: 65.2 mg/dL (ref >39.00)
LDL Cholesterol: 47 mg/dL (ref 0–99)
NonHDL: 64.28
Total CHOL/HDL Ratio: 2
Triglycerides: 88 mg/dL (ref 0.0–149.0)
VLDL: 17.6 mg/dL (ref 0.0–40.0)

## 2020-07-21 LAB — CBC
HCT: 38.9 % (ref 36.0–46.0)
Hemoglobin: 12.4 g/dL (ref 12.0–15.0)
MCHC: 31.9 g/dL (ref 30.0–36.0)
MCV: 78.8 fl (ref 78.0–100.0)
Platelets: 177 10*3/uL (ref 150.0–400.0)
RBC: 4.93 Mil/uL (ref 3.87–5.11)
RDW: 18.5 % — ABNORMAL HIGH (ref 11.5–15.5)
WBC: 6.9 10*3/uL (ref 4.0–10.5)

## 2020-07-21 LAB — HEMOGLOBIN A1C: Hgb A1c MFr Bld: 9 % — ABNORMAL HIGH (ref 4.6–6.5)

## 2020-07-21 NOTE — Assessment & Plan Note (Signed)
Flu shot counseled. Covid-19 2 shots encouraged booster. Pneumonia complete. Shingrix counseled to get at pharmacy. Tetanus due counseled to get at pharmacy. Cologuard ordered. Mammogram due later 2022, pap smear aged out and dexa due 2026. Counseled about sun safety and mole surveillance. Counseled about the dangers of distracted driving. Given 10 year screening recommendations.

## 2020-07-21 NOTE — Assessment & Plan Note (Addendum)
BP at goal on losartan and torsemide. Checking CMP and adjust as needed. EKG done and without change from prior.

## 2020-07-21 NOTE — Assessment & Plan Note (Signed)
Advised to quit and she is unable to make an attempt at this time. Reminded about risk and harms from cigarette smoking.

## 2020-07-21 NOTE — Assessment & Plan Note (Signed)
Stable color changes and no signs of recurrent cellulitis.

## 2020-07-21 NOTE — Assessment & Plan Note (Signed)
Weight stable, encouraged to work on weight loss through diet and exercise.

## 2020-07-21 NOTE — Assessment & Plan Note (Signed)
Checking lipid panel and adjust as needed crestor 10 mg daily.

## 2020-07-21 NOTE — Assessment & Plan Note (Signed)
Checking HgA1c and adjust as needed. Taking metformin, jardiance, amaryl, actos. Taking ARB and statin. Foot exam up to date.

## 2020-08-11 ENCOUNTER — Other Ambulatory Visit: Payer: Self-pay | Admitting: Internal Medicine

## 2020-09-18 ENCOUNTER — Other Ambulatory Visit: Payer: Self-pay | Admitting: Internal Medicine

## 2020-11-10 ENCOUNTER — Other Ambulatory Visit: Payer: Self-pay

## 2020-11-10 ENCOUNTER — Ambulatory Visit: Payer: Medicare HMO | Attending: Internal Medicine

## 2020-11-10 ENCOUNTER — Other Ambulatory Visit (HOSPITAL_BASED_OUTPATIENT_CLINIC_OR_DEPARTMENT_OTHER): Payer: Self-pay

## 2020-11-10 DIAGNOSIS — Z23 Encounter for immunization: Secondary | ICD-10-CM

## 2020-11-10 MED ORDER — PFIZER-BIONT COVID-19 VAC-TRIS 30 MCG/0.3ML IM SUSP
INTRAMUSCULAR | 0 refills | Status: DC
  Filled 2020-11-10: qty 0.3, 1d supply, fill #0

## 2020-11-10 NOTE — Progress Notes (Signed)
   Covid-19 Vaccination Clinic  Name:  Cheryl Delgado    MRN: [id number] DOB: 11-Jan-1946  11/10/2020  [name] was observed post Covid-19 immunization for 15 minutes without incident. She was provided with Vaccine Information Sheet and instruction to access the V-Safe system.   [name] was instructed to call 911 with any severe reactions post vaccine: Difficulty breathing  Swelling of face and throat  A fast heartbeat  A bad rash all over body  Dizziness and weakness   Immunizations Administered     Name Date Dose VIS Date Route   PFIZER Comrnaty(Gray TOP) Covid-19 Vaccine 11/10/2020  3:17 PM 0.3 mL 03/23/2020 Intramuscular   Manufacturer: [employer]   Lot: [id number]   [place]: [phone]

## 2020-12-01 ENCOUNTER — Other Ambulatory Visit: Payer: Self-pay

## 2020-12-01 ENCOUNTER — Ambulatory Visit (HOSPITAL_COMMUNITY)
Admission: RE | Admit: 2020-12-01 | Discharge: 2020-12-01 | Disposition: A | Discharge status: Home / Self Care | Payer: Medicare HMO | Source: Ambulatory Visit | Attending: Family | Admitting: Family

## 2020-12-01 ENCOUNTER — Ambulatory Visit (INDEPENDENT_AMBULATORY_CARE_PROVIDER_SITE_OTHER): Payer: Medicare HMO | Admitting: Family

## 2020-12-01 ENCOUNTER — Encounter: Payer: Self-pay | Admitting: Family

## 2020-12-01 VITALS — BP 120/68 | HR 84 | Temp 98.5°F | Ht 62.0 in | Wt 221.6 lb

## 2020-12-01 DIAGNOSIS — R2242 Localized swelling, mass and lump, left lower limb: Secondary | ICD-10-CM | POA: Diagnosis not present

## 2020-12-01 MED ORDER — SULFAMETHOXAZOLE-TRIMETHOPRIM 800-160 MG PO TABS: 1.0000 | ORAL_TABLET | Freq: Two times a day (BID) | ORAL | 0 refills | Status: AC

## 2020-12-01 NOTE — Progress Notes (Signed)
Cheryl Delgado is a 75 y.o. female with the following history as recorded in [employer]:  Patient Active Problem List   Diagnosis Date Noted   Chronic pain of right knee 07/01/2019   Hyperlipidemia associated with type 2 diabetes mellitus ([place]) 04/01/2019   Smoking 1/2 pack a day or less 09/22/2012   Morbid obesity ([place]) 10/17/2010   Encounter for general adult medical examination with abnormal findings 10/17/2010   Uncontrolled diabetes mellitus with neurologic complication, without long-term current use of insulin ([place]) 08/15/2007   Essential hypertension 08/15/2007   Chronic venous insufficiency 08/15/2007   VERTIGO, HX OF 08/15/2007    Current Outpatient Medications  Medication Sig Dispense Refill   glimepiride (AMARYL) 4 MG tablet TAKE 1 TABLET(4 MG) BY MOUTH DAILY BEFORE BREAKFAST 90 tablet 1   JARDIANCE 25 MG TABS tablet TAKE 1 TABLET BY MOUTH DAILY 90 tablet 1   ketoconazole (NIZORAL) 2 % cream Apply 1 application topically daily. Rub in well especially between toes 60 g 0   losartan (COZAAR) 50 MG tablet TAKE 1 TABLET(50 MG) BY MOUTH DAILY 90 tablet 1   metFORMIN (GLUCOPHAGE) 1000 MG tablet TAKE 1 TABLET(1000 MG) BY MOUTH TWICE DAILY WITH A MEAL 180 tablet 0   pioglitazone (ACTOS) 30 MG tablet TAKE 1 TABLET(30 MG) BY MOUTH DAILY 90 tablet 1   rosuvastatin (CRESTOR) 10 MG tablet TAKE 1 TABLET(10 MG) BY MOUTH DAILY 90 tablet 0   torsemide (DEMADEX) 20 MG tablet TAKE 1 TABLET(20 MG) BY MOUTH DAILY 90 tablet 0   sulfamethoxazole-trimethoprim (BACTRIM DS) 800-160 MG tablet Take 1 tablet by mouth 2 (two) times daily for 10 days. 20 tablet 0   No current facility-administered medications for this visit.    Allergies: Patient has no known allergies.  Past Medical History:  Diagnosis Date   Chest pain, unspecified    Edema    peripheral   Hypertension    Type II or unspecified type diabetes mellitus without mention of complication, not stated as uncontrolled    Vertigo     Past  Surgical History:  Procedure Laterality Date   COLONOSCOPY  [id number]   dialtion and curettage     TOTAL ABDOMINAL HYSTERECTOMY W/ BILATERAL SALPINGOOPHORECTOMY     due to fibroid tumors and uterine bleeding    Family History  Problem Relation Age of Onset   Hyperlipidemia Mother    Diabetes Mother    Heart disease Mother    Breast cancer Neg Hx    Colon cancer Neg Hx    Stroke Neg Hx    Amblyopia Neg Hx    Blindness Neg Hx    Cataracts Neg Hx    Glaucoma Neg Hx    Macular degeneration Neg Hx    Retinal detachment Neg Hx    Strabismus Neg Hx    Retinitis pigmentosa Neg Hx     Social History   Tobacco Use   Smoking status: Every Day    Packs/day: 0.50    Years: 30.00    Pack years: 15.00    Types: Cigarettes   Smokeless tobacco: Never   Tobacco comments:    pt states she has smoked for a long time  Substance Use Topics   Alcohol use: No    Subjective:  Left leg pain/ swelling x 2 days; has had history of cellulitis/ pedal edema in the past- right leg was affected in November 2021; Does take Demadex daily- notes she has not taken today however; denies any chest pain or  shortness of breath today;  Did notice that has a small cut on the bottom of her left foot; Will be leaving for cruise in the morning at 5 am;  No chest pain, no shortness of breath;      Objective:  Vitals:   12/01/20 0837  BP: 120/68  Pulse: 84  Temp: 98.5 F (36.9 C)  TempSrc: Oral  SpO2: 97%  Weight: 221 lb 9.6 oz (100.5 kg)  Height: '5\' 2"'$  (1.575 m)    General: Well developed, well nourished, in no acute distress  Skin : Warm and dry.  Head: Normocephalic and atraumatic  Eyes: Sclera and conjunctiva clear; pupils round and reactive to light; extraocular movements intact  Lungs: Respirations unlabored;  Extremities: No bilateral edema, cyanosis, clubbing; marked redness noted of lower left leg Vessels: Symmetric bilaterally  Neurologic: Alert and oriented; speech intact; face  symmetrical; moves all extremities well; CNII-XII intact without focal deficit   Assessment:  1. Localized swelling of left lower leg     Plan:  Since patient is leaving for cruise in the morning, will check venous doppler to rule out DVT although symptoms are most consistent with cellulitis; STAT doppler negative for DVT;  Will re-treat with Bactrim which she has responded well to in the past for similar issue; she understands to see her PCP in follow up when she returns home.  This visit occurred during the SARS-CoV-2 public health emergency.  Safety protocols were in place, including screening questions prior to the visit, additional usage of staff PPE, and extensive cleaning of exam room while observing appropriate contact time as indicated for disinfecting solutions.   This visit occurred during the SARS-CoV-2 public health emergency.  Safety protocols were in place, including screening questions prior to the visit, additional usage of staff PPE, and extensive cleaning of exam room while observing appropriate contact time as indicated for disinfecting solutions.    No follow-ups on file.  No orders of the defined types were placed in this encounter.   Requested Prescriptions   Signed Prescriptions Disp Refills   sulfamethoxazole-trimethoprim (BACTRIM DS) 800-160 MG tablet 20 tablet 0    Sig: Take 1 tablet by mouth 2 (two) times daily for 10 days.

## 2020-12-01 NOTE — Patient Instructions (Signed)
Please schedule a follow up with Dr. Sharlet Salina when you get back from your cruise to follow up.  We will be in touch with the [place] results today so we can be sure it is safe to send you on your cruise.

## 2020-12-14 ENCOUNTER — Other Ambulatory Visit: Payer: Self-pay | Admitting: Internal Medicine

## 2021-01-22 ENCOUNTER — Other Ambulatory Visit: Payer: Self-pay | Admitting: Internal Medicine

## 2021-01-22 NOTE — Telephone Encounter (Signed)
Please refill as per office routine med refill policy (all routine meds to be refilled for 3 mo or monthly (per pt preference) up to one year from last visit, then month to month grace period for 3 mo, then further med refills will have to be denied) ? ?

## 2021-01-23 ENCOUNTER — Other Ambulatory Visit: Payer: Self-pay | Admitting: Internal Medicine

## 2021-01-23 NOTE — Telephone Encounter (Signed)
Please refill as per office routine med refill policy (all routine meds to be refilled for 3 mo or monthly (per pt preference) up to one year from last visit, then month to month grace period for 3 mo, then further med refills will have to be denied) ? ?

## 2021-02-01 ENCOUNTER — Other Ambulatory Visit: Payer: Self-pay | Admitting: Internal Medicine

## 2021-02-14 ENCOUNTER — Ambulatory Visit: Payer: Medicare HMO | Admitting: Internal Medicine

## 2021-02-19 ENCOUNTER — Telehealth: Payer: Self-pay | Admitting: Internal Medicine

## 2021-02-19 NOTE — Telephone Encounter (Signed)
Please change visit to tomorrow at 11 if she is able to make it. She cannot wait until 02/21/21 for this. Do we know what she has tried for pain? Can take tylenol if she has not tried this.

## 2021-02-19 NOTE — Telephone Encounter (Signed)
Spoke with the pt and she stated that she has taken otc tylenol for pain but is unable to keep anything down. Resulting in her throwing up her tylenol. She has been scheduled for 11 am 11.8.2022. she is aware of appt date and time.

## 2021-02-19 NOTE — Telephone Encounter (Signed)
Given that she is vomiting and cannot keep anything down I would likely recommend ER today as she may have serious infection.

## 2021-02-19 NOTE — Telephone Encounter (Signed)
Patient calling in  Says she is experiencing cellulitis in her left leg & is in a lot of pain  Patient scheduled OV 11/09 but wants to know if provider can send something over to pharmacy to help w/ pain until visit  Pharmacy:  [facility] [place], [address] [place]  Phone:  [phone] Fax:  [phone]

## 2021-02-19 NOTE — Telephone Encounter (Signed)
See below

## 2021-02-19 NOTE — Telephone Encounter (Signed)
Spoke with the patient and she stated that she has not vomited today. It was Sunday when she had the episode of vomiting.

## 2021-02-20 ENCOUNTER — Other Ambulatory Visit: Payer: Self-pay

## 2021-02-20 ENCOUNTER — Encounter: Payer: Self-pay | Admitting: Internal Medicine

## 2021-02-20 ENCOUNTER — Ambulatory Visit (INDEPENDENT_AMBULATORY_CARE_PROVIDER_SITE_OTHER): Payer: Medicare HMO | Admitting: Internal Medicine

## 2021-02-20 VITALS — BP 126/80 | HR 94 | Resp 18 | Ht 62.0 in | Wt 214.2 lb

## 2021-02-20 DIAGNOSIS — E1169 Type 2 diabetes mellitus with other specified complication: Secondary | ICD-10-CM | POA: Diagnosis not present

## 2021-02-20 DIAGNOSIS — L03116 Cellulitis of left lower limb: Secondary | ICD-10-CM

## 2021-02-20 DIAGNOSIS — E118 Type 2 diabetes mellitus with unspecified complications: Secondary | ICD-10-CM | POA: Diagnosis not present

## 2021-02-20 DIAGNOSIS — E785 Hyperlipidemia, unspecified: Secondary | ICD-10-CM | POA: Diagnosis not present

## 2021-02-20 LAB — POCT GLYCOSYLATED HEMOGLOBIN (HGB A1C): Hemoglobin A1C: 9.9 % — AB (ref 4.0–5.6)

## 2021-02-20 MED ORDER — SULFAMETHOXAZOLE-TRIMETHOPRIM 800-160 MG PO TABS: 1.0000 | ORAL_TABLET | Freq: Two times a day (BID) | ORAL | 0 refills | Status: DC

## 2021-02-20 MED ORDER — ONDANSETRON HCL 4 MG PO TABS: 4.0000 mg | ORAL_TABLET | Freq: Three times a day (TID) | ORAL | 0 refills | Status: DC | PRN

## 2021-02-20 MED ORDER — CEPHALEXIN 500 MG PO CAPS: 500.0000 mg | ORAL_CAPSULE | Freq: Two times a day (BID) | ORAL | 0 refills | Status: DC

## 2021-02-20 MED ORDER — TRAMADOL HCL 50 MG PO TABS: 50.0000 mg | ORAL_TABLET | Freq: Two times a day (BID) | ORAL | 0 refills | Status: AC | PRN

## 2021-02-20 NOTE — Patient Instructions (Addendum)
We have sent in bactrim as the antibiotic to take 1 pill twice a day for 10 days.  We have also sent in a second antibiotic to also take at the same time called keflex (cephalexin) to take 1 pill twice a day for 10 days.   We have sent in a nausea medication to use called zofran if needed.  We have sent in tramadol for pain to take if needed up to twice a day.

## 2021-02-20 NOTE — Progress Notes (Signed)
   Subjective:   Patient ID: Cheryl Delgado, female    DOB: [birth year], 75 y.o.   MRN: [id number]  HPI The patient is a 75 YO female coming in for left leg infection. Swelling worse lately and redness started about 2 days ago. Has been spreading.   Review of Systems  Constitutional: Negative.   HENT: Negative.    Eyes: Negative.   Respiratory:  Negative for cough, chest tightness and shortness of breath.   Cardiovascular:  Positive for leg swelling. Negative for chest pain and palpitations.  Gastrointestinal:  Negative for abdominal distention, abdominal pain, constipation, diarrhea, nausea and vomiting.  Musculoskeletal: Negative.   Skin:  Positive for color change and rash.  Neurological: Negative.   Psychiatric/Behavioral: Negative.     Objective:  Physical Exam Constitutional:      Appearance: She is well-developed.  HENT:     Head: Normocephalic and atraumatic.  Cardiovascular:     Rate and Rhythm: Normal rate and regular rhythm.  Pulmonary:     Effort: Pulmonary effort is normal. No respiratory distress.     Breath sounds: Normal breath sounds. No wheezing or rales.  Abdominal:     General: Bowel sounds are normal. There is no distension.     Palpations: Abdomen is soft.     Tenderness: There is no abdominal tenderness. There is no rebound.  Musculoskeletal:        General: Tenderness present.     Cervical back: Normal range of motion.     Right lower leg: Edema present.     Left lower leg: Edema present.     Comments: Left leg swollen and redness to the mid shin about 2 cm inferior to the knee  Skin:    General: Skin is warm and dry.  Neurological:     Mental Status: She is alert and oriented to person, place, and time.     Coordination: Coordination normal.    Vitals:   02/20/21 1116  BP: 126/80  Pulse: 94  Resp: 18  SpO2: 97%  Weight: 214 lb 3.2 oz (97.2 kg)  Height: 5\' 2"  (1.575 m)    This visit occurred during the SARS-CoV-2 public health emergency.   Safety protocols were in place, including screening questions prior to the visit, additional usage of staff PPE, and extensive cleaning of exam room while observing appropriate contact time as indicated for disinfecting solutions.   Assessment & Plan:

## 2021-02-21 ENCOUNTER — Other Ambulatory Visit: Payer: Self-pay | Admitting: Internal Medicine

## 2021-02-21 ENCOUNTER — Ambulatory Visit: Payer: Medicare HMO | Admitting: Internal Medicine

## 2021-02-23 ENCOUNTER — Encounter: Payer: Self-pay | Admitting: Internal Medicine

## 2021-02-23 DIAGNOSIS — L03116 Cellulitis of left lower limb: Secondary | ICD-10-CM | POA: Insufficient documentation

## 2021-02-23 NOTE — Assessment & Plan Note (Signed)
POC HgA1c done and with worse control than before and not well controlled. She is taking metformin and actos and amaryl. We will make adjustments to her regimen once checking coverage.

## 2021-02-23 NOTE — Assessment & Plan Note (Signed)
Has poorly controlled diabetes and recurrence in same leg. We will start with 2 antibiotics to lower risk of hospitalization. Rx keflex and bactrim 10 day supply and needs visit in 5-7 days. If no improvement in 2-3 days or worsening needs to seek care at ER.

## 2021-02-26 ENCOUNTER — Encounter: Payer: Self-pay | Admitting: Internal Medicine

## 2021-02-26 ENCOUNTER — Other Ambulatory Visit: Payer: Self-pay

## 2021-02-26 ENCOUNTER — Ambulatory Visit (INDEPENDENT_AMBULATORY_CARE_PROVIDER_SITE_OTHER): Payer: Medicare HMO | Admitting: Internal Medicine

## 2021-02-26 DIAGNOSIS — L03116 Cellulitis of left lower limb: Secondary | ICD-10-CM

## 2021-02-26 DIAGNOSIS — F17210 Nicotine dependence, cigarettes, uncomplicated: Secondary | ICD-10-CM

## 2021-02-26 DIAGNOSIS — E118 Type 2 diabetes mellitus with unspecified complications: Secondary | ICD-10-CM

## 2021-02-26 MED ORDER — PIOGLITAZONE HCL 45 MG PO TABS: 45.0000 mg | ORAL_TABLET | Freq: Every day | ORAL | 3 refills | Status: DC

## 2021-02-26 MED ORDER — SENNA-DOCUSATE SODIUM 8.6-50 MG PO TABS: 1.0000 | ORAL_TABLET | Freq: Two times a day (BID) | ORAL | 0 refills | Status: DC | PRN

## 2021-02-26 MED ORDER — SULFAMETHOXAZOLE-TRIMETHOPRIM 800-160 MG PO TABS: 1.0000 | ORAL_TABLET | Freq: Two times a day (BID) | ORAL | 0 refills | Status: DC

## 2021-02-26 NOTE — Assessment & Plan Note (Signed)
We did discuss her prior POC Hga1c 9.9 and she was out of several medications over the last month or so. We will resume metformin 1000 mg BID and amaryl 4 mg daily and increase actos to 45 mg daily. Follow up in 3 months. Foot exam done.

## 2021-02-26 NOTE — Assessment & Plan Note (Signed)
Overall improving but still present. She will finish keflex 10 day and bactrim 10 day. Then she will refill bactrim for another 2 weeks of treatment and follow up in about 2 weeks to assess healing. She is high risk due to poorly controlled diabetes.

## 2021-02-26 NOTE — Assessment & Plan Note (Signed)
Did advise to stop smoking as this will help delay wound healing and put her more at risk for infection.

## 2021-02-26 NOTE — Patient Instructions (Addendum)
We will plan to keep you on the antibiotics for another 2 weeks. We will have you finish the keflex (cephalexin) and then stop. We have sent in a refill of the bactrim so once done you will take this another 2 weeks.  We have changed the dose of the actos to 45 mg daily to help bring the sugars down.  We have sent in senokot-d to take up to twice a day for the constipation.

## 2021-02-26 NOTE — Progress Notes (Signed)
   Subjective:   Patient ID: Cheryl Delgado, female    DOB: [birth year], 75 y.o.   MRN: [id number]  HPI The patient is a 75 YO female coming in for follow up diabetes and cellulitis.   Review of Systems  Constitutional: Negative.   HENT: Negative.    Eyes: Negative.   Respiratory:  Negative for cough, chest tightness and shortness of breath.   Cardiovascular:  Positive for leg swelling. Negative for chest pain and palpitations.  Gastrointestinal:  Negative for abdominal distention, abdominal pain, constipation, diarrhea, nausea and vomiting.  Musculoskeletal:  Positive for arthralgias and myalgias.  Skin:  Positive for rash.  Neurological: Negative.   Psychiatric/Behavioral: Negative.     Objective:  Physical Exam Constitutional:      Appearance: She is well-developed.  HENT:     Head: Normocephalic and atraumatic.  Cardiovascular:     Rate and Rhythm: Normal rate and regular rhythm.  Pulmonary:     Effort: Pulmonary effort is normal. No respiratory distress.     Breath sounds: Normal breath sounds. No wheezing or rales.  Abdominal:     General: Bowel sounds are normal. There is no distension.     Palpations: Abdomen is soft.     Tenderness: There is no abdominal tenderness. There is no rebound.  Musculoskeletal:        General: Tenderness present.     Cervical back: Normal range of motion.     Left lower leg: Edema present.     Comments: LLE with 2+ swelling, redness is fading and receding from prior visit, still present to mid-shin, not as present on the foot and ankle  Skin:    General: Skin is warm and dry.  Neurological:     Mental Status: She is alert and oriented to person, place, and time.     Coordination: Coordination normal.    Vitals:   02/26/21 1050  BP: 124/78  Pulse: 73  Resp: 18  SpO2: 95%  Weight: 213 lb 12.8 oz (97 kg)  Height: 5\' 2"  (1.575 m)    This visit occurred during the SARS-CoV-2 public health emergency.  Safety protocols were in place,  including screening questions prior to the visit, additional usage of staff PPE, and extensive cleaning of exam room while observing appropriate contact time as indicated for disinfecting solutions.   Assessment & Plan:

## 2021-03-02 ENCOUNTER — Inpatient Hospital Stay (HOSPITAL_COMMUNITY)
Admission: EM | Admit: 2021-03-02 | Discharge: 2021-03-05 | DRG: 603 | Disposition: A | Discharge status: Home Health | Payer: Medicare HMO | Attending: Internal Medicine | Admitting: Internal Medicine

## 2021-03-02 ENCOUNTER — Encounter (HOSPITAL_COMMUNITY): Payer: Self-pay | Admitting: Emergency Medicine

## 2021-03-02 ENCOUNTER — Telehealth: Payer: Self-pay | Admitting: Internal Medicine

## 2021-03-02 ENCOUNTER — Ambulatory Visit (HOSPITAL_COMMUNITY)
Admission: EM | Admit: 2021-03-02 | Discharge: 2021-03-02 | Disposition: A | Discharge status: Home / Self Care | Payer: Medicare HMO

## 2021-03-02 ENCOUNTER — Emergency Department (HOSPITAL_COMMUNITY): Payer: Medicare HMO

## 2021-03-02 ENCOUNTER — Encounter (HOSPITAL_COMMUNITY): Payer: Self-pay

## 2021-03-02 ENCOUNTER — Other Ambulatory Visit: Payer: Self-pay

## 2021-03-02 DIAGNOSIS — Z8249 Family history of ischemic heart disease and other diseases of the circulatory system: Secondary | ICD-10-CM

## 2021-03-02 DIAGNOSIS — L03116 Cellulitis of left lower limb: Secondary | ICD-10-CM | POA: Diagnosis not present

## 2021-03-02 DIAGNOSIS — Z20822 Contact with and (suspected) exposure to covid-19: Secondary | ICD-10-CM | POA: Diagnosis present

## 2021-03-02 DIAGNOSIS — E11628 Type 2 diabetes mellitus with other skin complications: Secondary | ICD-10-CM

## 2021-03-02 DIAGNOSIS — R600 Localized edema: Secondary | ICD-10-CM | POA: Diagnosis present

## 2021-03-02 DIAGNOSIS — Z8349 Family history of other endocrine, nutritional and metabolic diseases: Secondary | ICD-10-CM

## 2021-03-02 DIAGNOSIS — E118 Type 2 diabetes mellitus with unspecified complications: Secondary | ICD-10-CM | POA: Diagnosis not present

## 2021-03-02 DIAGNOSIS — I872 Venous insufficiency (chronic) (peripheral): Secondary | ICD-10-CM | POA: Diagnosis present

## 2021-03-02 DIAGNOSIS — F17210 Nicotine dependence, cigarettes, uncomplicated: Secondary | ICD-10-CM | POA: Diagnosis present

## 2021-03-02 DIAGNOSIS — I10 Essential (primary) hypertension: Secondary | ICD-10-CM | POA: Diagnosis present

## 2021-03-02 DIAGNOSIS — E785 Hyperlipidemia, unspecified: Secondary | ICD-10-CM | POA: Diagnosis present

## 2021-03-02 DIAGNOSIS — R5381 Other malaise: Secondary | ICD-10-CM | POA: Diagnosis present

## 2021-03-02 DIAGNOSIS — Z90710 Acquired absence of both cervix and uterus: Secondary | ICD-10-CM

## 2021-03-02 DIAGNOSIS — Z79899 Other long term (current) drug therapy: Secondary | ICD-10-CM

## 2021-03-02 DIAGNOSIS — E875 Hyperkalemia: Secondary | ICD-10-CM | POA: Diagnosis present

## 2021-03-02 DIAGNOSIS — Z6839 Body mass index (BMI) 39.0-39.9, adult: Secondary | ICD-10-CM

## 2021-03-02 DIAGNOSIS — Z833 Family history of diabetes mellitus: Secondary | ICD-10-CM

## 2021-03-02 DIAGNOSIS — Z7984 Long term (current) use of oral hypoglycemic drugs: Secondary | ICD-10-CM

## 2021-03-02 LAB — CBC WITH DIFFERENTIAL/PLATELET
Abs Immature Granulocytes: 0.02 10*3/uL (ref 0.00–0.07)
Basophils Absolute: 0.1 10*3/uL (ref 0.0–0.1)
Basophils Relative: 1 %
Eosinophils Absolute: 0.1 10*3/uL (ref 0.0–0.5)
Eosinophils Relative: 1 %
HCT: 40.1 % (ref 36.0–46.0)
Hemoglobin: 12.4 g/dL (ref 12.0–15.0)
Immature Granulocytes: 0 %
Lymphocytes Relative: 26 %
Lymphs Abs: 2 10*3/uL (ref 0.7–4.0)
MCH: 24.9 pg — ABNORMAL LOW (ref 26.0–34.0)
MCHC: 30.9 g/dL (ref 30.0–36.0)
MCV: 80.7 fL (ref 80.0–100.0)
Monocytes Absolute: 0.6 10*3/uL (ref 0.1–1.0)
Monocytes Relative: 8 %
Neutro Abs: 4.9 10*3/uL (ref 1.7–7.7)
Neutrophils Relative %: 64 %
Platelets: 350 10*3/uL (ref 150–400)
RBC: 4.97 MIL/uL (ref 3.87–5.11)
RDW: 17 % — ABNORMAL HIGH (ref 11.5–15.5)
WBC: 7.6 10*3/uL (ref 4.0–10.5)
nRBC: 0 % (ref 0.0–0.2)

## 2021-03-02 LAB — LACTIC ACID, PLASMA
Lactic Acid, Venous: 1.5 mmol/L (ref 0.5–1.9)
Lactic Acid, Venous: 1.8 mmol/L (ref 0.5–1.9)

## 2021-03-02 LAB — SEDIMENTATION RATE: Sed Rate: 70 mm/hr — ABNORMAL HIGH (ref 0–22)

## 2021-03-02 LAB — COMPREHENSIVE METABOLIC PANEL
ALT: 14 U/L (ref 0–44)
AST: 18 U/L (ref 15–41)
Albumin: 3.2 g/dL — ABNORMAL LOW (ref 3.5–5.0)
Alkaline Phosphatase: 62 U/L (ref 38–126)
Anion gap: 9 (ref 5–15)
BUN: 28 mg/dL — ABNORMAL HIGH (ref 8–23)
CO2: 29 mmol/L (ref 22–32)
Calcium: 9.4 mg/dL (ref 8.9–10.3)
Chloride: 98 mmol/L (ref 98–111)
Creatinine, Ser: 1.12 mg/dL — ABNORMAL HIGH (ref 0.44–1.00)
GFR, Estimated: 52 mL/min — ABNORMAL LOW (ref >60)
Glucose, Bld: 125 mg/dL — ABNORMAL HIGH (ref 70–99)
Potassium: 4.5 mmol/L (ref 3.5–5.1)
Sodium: 136 mmol/L (ref 135–145)
Total Bilirubin: 0.3 mg/dL (ref 0.3–1.2)
Total Protein: 7.8 g/dL (ref 6.5–8.1)

## 2021-03-02 LAB — CBG MONITORING, ED: Glucose-Capillary: 148 mg/dL — ABNORMAL HIGH (ref 70–99)

## 2021-03-02 LAB — RESP PANEL BY RT-PCR (FLU A&B, COVID) ARPGX2
Influenza A by PCR: NEGATIVE
Influenza B by PCR: NEGATIVE
SARS Coronavirus 2 by RT PCR: NEGATIVE

## 2021-03-02 LAB — MRSA NEXT GEN BY PCR, NASAL: MRSA by PCR Next Gen: NOT DETECTED

## 2021-03-02 MED ORDER — GLIMEPIRIDE 4 MG PO TABS
4.0000 mg | ORAL_TABLET | Freq: Every day | ORAL | Status: DC
  Filled 2021-03-02: qty 1

## 2021-03-02 MED ORDER — ONDANSETRON HCL 4 MG/2ML IJ SOLN: 4.0000 mg | Freq: Four times a day (QID) | INTRAMUSCULAR | Status: DC | PRN

## 2021-03-02 MED ORDER — LOSARTAN POTASSIUM 50 MG PO TABS
50.0000 mg | ORAL_TABLET | Freq: Every day | ORAL | Status: DC
  Administered 2021-03-03: 50 mg via ORAL
  Filled 2021-03-02: qty 1
  Filled 2021-03-02: qty 2

## 2021-03-02 MED ORDER — TRAMADOL HCL 50 MG PO TABS
50.0000 mg | ORAL_TABLET | Freq: Four times a day (QID) | ORAL | Status: DC | PRN
  Administered 2021-03-03 – 2021-03-04 (×3): 50 mg via ORAL
  Filled 2021-03-02 (×3): qty 1

## 2021-03-02 MED ORDER — ACETAMINOPHEN 325 MG PO TABS
650.0000 mg | ORAL_TABLET | Freq: Four times a day (QID) | ORAL | Status: DC | PRN
  Administered 2021-03-05: 650 mg via ORAL
  Filled 2021-03-02: qty 2

## 2021-03-02 MED ORDER — METFORMIN HCL 500 MG PO TABS
1000.0000 mg | ORAL_TABLET | Freq: Two times a day (BID) | ORAL | Status: DC
  Administered 2021-03-03: 1000 mg via ORAL
  Filled 2021-03-02: qty 2

## 2021-03-02 MED ORDER — SODIUM CHLORIDE 0.9 % IV SOLN
2.0000 g | Freq: Once | INTRAVENOUS | Status: AC
  Administered 2021-03-02: 2 g via INTRAVENOUS
  Filled 2021-03-02: qty 2

## 2021-03-02 MED ORDER — PIOGLITAZONE HCL 30 MG PO TABS
30.0000 mg | ORAL_TABLET | Freq: Every day | ORAL | Status: DC
  Administered 2021-03-03: 30 mg via ORAL
  Filled 2021-03-02: qty 1

## 2021-03-02 MED ORDER — ACETAMINOPHEN 650 MG RE SUPP: 650.0000 mg | Freq: Four times a day (QID) | RECTAL | Status: DC | PRN

## 2021-03-02 MED ORDER — VANCOMYCIN HCL IN DEXTROSE 1-5 GM/200ML-% IV SOLN
1000.0000 mg | Freq: Once | INTRAVENOUS | Status: AC
  Administered 2021-03-02: 1000 mg via INTRAVENOUS
  Filled 2021-03-02: qty 200

## 2021-03-02 MED ORDER — ONDANSETRON HCL 4 MG PO TABS: 4.0000 mg | ORAL_TABLET | Freq: Four times a day (QID) | ORAL | Status: DC | PRN

## 2021-03-02 MED ORDER — ROSUVASTATIN CALCIUM 10 MG PO TABS
10.0000 mg | ORAL_TABLET | Freq: Every day | ORAL | Status: DC
  Administered 2021-03-03 – 2021-03-05 (×3): 10 mg via ORAL
  Filled 2021-03-02 (×3): qty 1

## 2021-03-02 MED ORDER — EMPAGLIFLOZIN 25 MG PO TABS
25.0000 mg | ORAL_TABLET | Freq: Every day | ORAL | Status: DC
  Administered 2021-03-03: 25 mg via ORAL
  Filled 2021-03-02: qty 1

## 2021-03-02 MED ORDER — SODIUM CHLORIDE 0.9 % IV SOLN
2.0000 g | INTRAVENOUS | Status: DC
  Administered 2021-03-03 – 2021-03-05 (×3): 2 g via INTRAVENOUS
  Filled 2021-03-02 (×3): qty 20

## 2021-03-02 MED ORDER — ENOXAPARIN SODIUM 40 MG/0.4ML IJ SOSY
40.0000 mg | PREFILLED_SYRINGE | INTRAMUSCULAR | Status: DC
  Administered 2021-03-02 – 2021-03-04 (×3): 40 mg via SUBCUTANEOUS
  Filled 2021-03-02 (×3): qty 0.4

## 2021-03-02 MED ORDER — TORSEMIDE 20 MG PO TABS
20.0000 mg | ORAL_TABLET | Freq: Every day | ORAL | Status: DC
  Administered 2021-03-03: 20 mg via ORAL
  Filled 2021-03-02: qty 1

## 2021-03-02 MED ORDER — METRONIDAZOLE 500 MG PO TABS
500.0000 mg | ORAL_TABLET | Freq: Two times a day (BID) | ORAL | Status: DC
  Administered 2021-03-03: 500 mg via ORAL
  Filled 2021-03-02: qty 1

## 2021-03-02 NOTE — ED Triage Notes (Signed)
Pt presents with left leg pain, swelling, and drainage. States has hx of cellulitis.

## 2021-03-02 NOTE — Telephone Encounter (Signed)
Informed patient of provider's recommendation, patient understood and stated she would go  *see below*

## 2021-03-02 NOTE — ED Triage Notes (Signed)
Patient is being discharged from the Urgent Care and sent to the Emergency Department via POV with son . Per Olene Craven, PA, patient is in need of higher level of care due to need for IV antibiotics. Patient is aware and verbalizes understanding of plan of care.  Vitals:   03/02/21 1439  BP: 128/80  Pulse: 80  Resp: 17  Temp: 97.7 F (36.5 C)  SpO2: 95%

## 2021-03-02 NOTE — ED Provider Notes (Signed)
[facility] DEPT Provider Note   CSN: [id number] Arrival date & time: 03/02/21  1519     History Chief Complaint  Patient presents with   Leg Swelling   Wound Infection         Cheryl Delgado is a 75 y.o. female.  The history is provided by the patient.  Illness Location:  Left lower leg Quality:  Redness, swellign Severity:  Moderate Onset quality:  Gradual Duration:  2 weeks Timing:  Constant Progression:  Worsening Chronicity:  New Context:  Patient here for need for possible IV antibiotics.  She has been on Keflex and Bactrim for left lower leg infection for the last week plus.  Originally was getting better and not getting worse.  Sent by primary care doctor. Relieved by:  Nothing Worsened by:  Nothing Associated symptoms: no abdominal pain, no chest pain, no congestion, no cough, no diarrhea, no ear pain, no fatigue, no fever, no headaches, no loss of consciousness, no myalgias, no nausea, no rash, no rhinorrhea, no shortness of breath, no sore throat, no vomiting and no wheezing       Past Medical History:  Diagnosis Date   Chest pain, unspecified    Edema    peripheral   Hypertension    Type II or unspecified type diabetes mellitus without mention of complication, not stated as uncontrolled    Vertigo     Patient Active Problem List   Diagnosis Date Noted   Left leg cellulitis 02/23/2021   Chronic pain of right knee 07/01/2019   Hyperlipidemia associated with type 2 diabetes mellitus ([place]) 04/01/2019   Smoking 1/2 pack a day or less 09/22/2012   Morbid obesity ([place]) 10/17/2010   Encounter for general adult medical examination with abnormal findings 10/17/2010   Diabetes mellitus type 2 with complications ([place]) 32/44/0102   Essential hypertension 08/15/2007   Chronic venous insufficiency 08/15/2007   VERTIGO, HX OF 08/15/2007    Past Surgical History:  Procedure Laterality Date   COLONOSCOPY  [id number]   dialtion and  curettage     TOTAL ABDOMINAL HYSTERECTOMY W/ BILATERAL SALPINGOOPHORECTOMY     due to fibroid tumors and uterine bleeding     OB History   No obstetric history on file.     Family History  Problem Relation Age of Onset   Hyperlipidemia Mother    Diabetes Mother    Heart disease Mother    Breast cancer Neg Hx    Colon cancer Neg Hx    Stroke Neg Hx    Amblyopia Neg Hx    Blindness Neg Hx    Cataracts Neg Hx    Glaucoma Neg Hx    Macular degeneration Neg Hx    Retinal detachment Neg Hx    Strabismus Neg Hx    Retinitis pigmentosa Neg Hx     Social History   Tobacco Use   Smoking status: Every Day    Packs/day: 0.50    Years: 30.00    Pack years: 15.00    Types: Cigarettes   Smokeless tobacco: Never   Tobacco comments:    pt states she has smoked for a long time  Vaping Use   Vaping Use: Never used  Substance Use Topics   Alcohol use: No   Drug use: No    Home Medications Prior to Admission medications   Medication Sig Start Date End Date Taking? Authorizing Provider  cephALEXin (KEFLEX) 500 MG capsule Take 1 capsule (500 mg total) by  mouth 2 (two) times daily. 02/20/21   Hoyt Koch, MD  glimepiride (AMARYL) 4 MG tablet TAKE 1 TABLET(4 MG) BY MOUTH DAILY BEFORE AND BREAKFAST 01/23/21   Biagio Borg, MD  JARDIANCE 25 MG TABS tablet TAKE 1 TABLET BY MOUTH DAILY 09/19/20   Hoyt Koch, MD  ketoconazole (NIZORAL) 2 % cream Apply 1 application topically daily. Rub in well especially between toes 03/29/20   Hoyt Koch, MD  losartan (COZAAR) 50 MG tablet TAKE 1 TABLET(50 MG) BY MOUTH DAILY 09/19/20   Hoyt Koch, MD  metFORMIN (GLUCOPHAGE) 1000 MG tablet TAKE 1 TABLET(1000 MG) BY MOUTH TWICE DAILY WITH A MEAL 02/22/21   Hoyt Koch, MD  ondansetron (ZOFRAN) 4 MG tablet Take 1 tablet (4 mg total) by mouth every 8 (eight) hours as needed for nausea or vomiting. 02/20/21   Hoyt Koch, MD  pioglitazone (ACTOS) 45 MG  tablet Take 1 tablet (45 mg total) by mouth daily. 02/26/21   Hoyt Koch, MD  rosuvastatin (CRESTOR) 10 MG tablet TAKE 1 TABLET(10 MG) BY MOUTH DAILY 02/22/21   Hoyt Koch, MD  sennosides-docusate sodium (SENOKOT-S) 8.6-50 MG tablet Take 1 tablet by mouth 2 (two) times daily as needed for constipation. 02/26/21   Hoyt Koch, MD  sulfamethoxazole-trimethoprim (BACTRIM DS) 800-160 MG tablet Take 1 tablet by mouth 2 (two) times daily. 02/26/21   Hoyt Koch, MD  torsemide (DEMADEX) 20 MG tablet TAKE 1 TABLET(20 MG) BY MOUTH DAILY 12/15/20   Hoyt Koch, MD    Allergies    Patient has no known allergies.  Review of Systems   Review of Systems  Constitutional:  Negative for chills, fatigue and fever.  HENT:  Negative for congestion, ear pain, rhinorrhea and sore throat.   Eyes:  Negative for pain and visual disturbance.  Respiratory:  Negative for cough, shortness of breath and wheezing.   Cardiovascular:  Negative for chest pain and palpitations.  Gastrointestinal:  Negative for abdominal pain, diarrhea, nausea and vomiting.  Genitourinary:  Negative for dysuria and hematuria.  Musculoskeletal:  Negative for arthralgias, back pain and myalgias.  Skin:  Positive for color change and wound. Negative for rash.  Neurological:  Negative for seizures, loss of consciousness, syncope and headaches.  All other systems reviewed and are negative.  Physical Exam Updated Vital Signs  ED Triage Vitals  Enc Vitals Group     BP 03/02/21 1534 (!) 147/58     Pulse Rate 03/02/21 1534 82     Resp 03/02/21 1534 16     Temp 03/02/21 1534 98.9 F (37.2 C)     Temp Source 03/02/21 1534 Oral     SpO2 03/02/21 1534 100 %     Weight --      Height --      Head Circumference --      Peak Flow --      Pain Score 03/02/21 1544 6     Pain Loc --      Pain Edu? --      Excl. in [place]? --      Physical Exam Vitals and nursing note reviewed.  Constitutional:       General: She is not in acute distress.    Appearance: She is well-developed. She is not ill-appearing.  HENT:     Head: Normocephalic and atraumatic.     Nose: Nose normal.     Mouth/Throat:     Mouth: Mucous membranes are  moist.  Eyes:     Extraocular Movements: Extraocular movements intact.     Conjunctiva/sclera: Conjunctivae normal.     Pupils: Pupils are equal, round, and reactive to light.  Cardiovascular:     Rate and Rhythm: Normal rate and regular rhythm.     Pulses: Normal pulses.     Heart sounds: Normal heart sounds. No murmur heard. Pulmonary:     Effort: Pulmonary effort is normal. No respiratory distress.     Breath sounds: Normal breath sounds.  Abdominal:     Palpations: Abdomen is soft.     Tenderness: There is no abdominal tenderness.  Musculoskeletal:        General: Swelling present.     Cervical back: Normal range of motion and neck supple.  Skin:    General: Skin is warm and dry.     Capillary Refill: Capillary refill takes less than 2 seconds.     Findings: Erythema present.     Comments: Erythema and swelling to the left lower extremity from around the knee down into the left forefoot, there is some skin breakdown to the calf region  Neurological:     General: No focal deficit present.     Mental Status: She is alert.  Psychiatric:        Mood and Affect: Mood normal.    ED Results / Procedures / Treatments   Labs (all labs ordered are listed, but only abnormal results are displayed) Labs Reviewed  COMPREHENSIVE METABOLIC PANEL - Abnormal; Notable for the following components:      Result Value   Glucose, Bld 125 (*)    BUN 28 (*)    Creatinine, Ser 1.12 (*)    Albumin 3.2 (*)    GFR, Estimated 52 (*)    All other components within normal limits  CBC WITH DIFFERENTIAL/PLATELET - Abnormal; Notable for the following components:   MCH 24.9 (*)    RDW 17.0 (*)    All other components within normal limits  RESP PANEL BY RT-PCR (FLU A&B,  COVID) ARPGX2  LACTIC ACID, PLASMA  LACTIC ACID, PLASMA    EKG None  Radiology DG Tibia/Fibula Left  Result Date: 03/02/2021 CLINICAL DATA:  Left leg wound with redness and swelling x2 weeks. EXAM: LEFT TIBIA AND FIBULA - 2 VIEW COMPARISON:  None. FINDINGS: There is no evidence of fracture or other focal bone lesions. Diffuse soft tissue swelling is seen. IMPRESSION: 1. No acute osseous abnormality. 2. Diffuse soft tissue swelling. Electronically Signed   By: Virgina Norfolk M.D.   On: 03/02/2021 17:19   DG Foot Complete Left  Result Date: 03/02/2021 CLINICAL DATA:  Left leg wounds with redness and swelling x2 weeks. EXAM: LEFT FOOT - COMPLETE 3+ VIEW COMPARISON:  None. FINDINGS: There is no evidence of fracture or dislocation. There is no evidence of arthropathy or other focal bone abnormality. Marked severity diffuse soft tissue swelling is noted. IMPRESSION: 1. No acute osseous abnormality. 2. Marked severity diffuse soft tissue swelling. Electronically Signed   By: Virgina Norfolk M.D.   On: 03/02/2021 17:20    Procedures Procedures   Medications Ordered in ED Medications - No data to display  ED Course  I have reviewed the triage vital signs and the nursing notes.  Pertinent labs & imaging results that were available during my care of the patient were reviewed by me and considered in my medical decision making (see chart for details).    MDM Rules/Calculators/A&P  TEREZ MONTEE is here for evaluation of leg infection.  Normal vitals.  No fever.  No major leukocytosis or lactic acidosis.  Patient has been on Keflex and Bactrim for over a week with no major improvement of her left lower leg cellulitis.  There appears to be an ulcerative lesion to the back of the left calf which is likely the nidus for infection.  Diffuse soft tissue swelling seen on x-rays but no obvious crepitus or gas.  She has good Doppler pulses in her lower extremities.  She  has no major pain in her calf.  Would likely benefit from a DVT study.  This appears to be infectious as there is erythema and swelling especially when compared to the right lower extremity.  It seems that she is not doing well on outpatient oral antibiotics.  Will admit to medicine for IV antibiotics.  Does not appear to have sepsis given vitals and labs.  Would likely benefit from wound consult as well.  This chart was dictated using voice recognition software.  Despite best efforts to proofread,  errors can occur which can change the documentation meaning.   Final Clinical Impression(s) / ED Diagnoses Final diagnoses:  Cellulitis of left lower extremity    Rx / DC Orders ED Discharge Orders     None        Lennice Sites, DO 03/02/21 1937

## 2021-03-02 NOTE — Telephone Encounter (Signed)
See below

## 2021-03-02 NOTE — Telephone Encounter (Signed)
Fyi.

## 2021-03-02 NOTE — H&P (Signed)
History and Physical    Cheryl Delgado NID:[id number] DOB: June 27, 1945 DOA: 03/02/2021  PCP: Hoyt Koch, MD  Patient coming from: Home  I have personally briefly reviewed patient's old medical records in [place]  Chief Complaint: Leg infection  HPI: Cheryl Delgado is a 75 y.o. female with medical history significant of DM2, HTN, peripheral edema.  Pt with 2 week history of LLE erythema, swelling, edema.  Symptoms constant, worsening.  Failed outpt keflex and bactrim.  Sent in by PCP for possible IV ABx given failed outpt therapy.  No CP, no abd pain, no cough, no congestion, no fever.   ED Course: Started on rocephin + vanc.   Review of Systems: As per HPI, otherwise all review of systems negative.  Past Medical History:  Diagnosis Date   Chest pain, unspecified    Edema    peripheral   Hypertension    Type II or unspecified type diabetes mellitus without mention of complication, not stated as uncontrolled    Vertigo     Past Surgical History:  Procedure Laterality Date   COLONOSCOPY  [id number]   dialtion and curettage     TOTAL ABDOMINAL HYSTERECTOMY W/ BILATERAL SALPINGOOPHORECTOMY     due to fibroid tumors and uterine bleeding     reports that she has been smoking cigarettes. She has a 15.00 pack-year smoking history. She has never used smokeless tobacco. She reports that she does not drink alcohol and does not use drugs.  No Known Allergies  Family History  Problem Relation Age of Onset   Hyperlipidemia Mother    Diabetes Mother    Heart disease Mother    Breast cancer Neg Hx    Colon cancer Neg Hx    Stroke Neg Hx    Amblyopia Neg Hx    Blindness Neg Hx    Cataracts Neg Hx    Glaucoma Neg Hx    Macular degeneration Neg Hx    Retinal detachment Neg Hx    Strabismus Neg Hx    Retinitis pigmentosa Neg Hx      Prior to Admission medications   Medication Sig Start Date End Date Taking? Authorizing Provider  acetaminophen (TYLENOL)  500 MG tablet Take 1,000 mg by mouth every 6 (six) hours as needed for moderate pain.   Yes [provider]  glimepiride (AMARYL) 4 MG tablet TAKE 1 TABLET(4 MG) BY MOUTH DAILY BEFORE AND BREAKFAST Patient taking differently: Take 4 mg by mouth daily with breakfast. 01/23/21  Yes Biagio Borg, MD  JARDIANCE 25 MG TABS tablet TAKE 1 TABLET BY MOUTH DAILY Patient taking differently: 25 mg daily. 09/19/20  Yes Hoyt Koch, MD  losartan (COZAAR) 50 MG tablet TAKE 1 TABLET(50 MG) BY MOUTH DAILY Patient taking differently: Take 50 mg by mouth daily. 09/19/20  Yes Hoyt Koch, MD  metFORMIN (GLUCOPHAGE) 1000 MG tablet TAKE 1 TABLET(1000 MG) BY MOUTH TWICE DAILY WITH A MEAL Patient taking differently: Take 1,000 mg by mouth 2 (two) times daily with a meal. 02/22/21  Yes Hoyt Koch, MD  ondansetron (ZOFRAN) 4 MG tablet Take 1 tablet (4 mg total) by mouth every 8 (eight) hours as needed for nausea or vomiting. 02/20/21  Yes Hoyt Koch, MD  pioglitazone (ACTOS) 30 MG tablet Take 30 mg by mouth daily.   Yes [provider]  pioglitazone (ACTOS) 45 MG tablet Take 1 tablet (45 mg total) by mouth daily. 02/26/21  Yes Hoyt Koch, MD  rosuvastatin (CRESTOR) 10 MG tablet TAKE 1 TABLET(10 MG) BY MOUTH DAILY Patient taking differently: Take 10 mg by mouth daily. 02/22/21  Yes Hoyt Koch, MD  torsemide (DEMADEX) 20 MG tablet TAKE 1 TABLET(20 MG) BY MOUTH DAILY Patient taking differently: Take 20 mg by mouth daily. 12/15/20  Yes Hoyt Koch, MD  traMADol (ULTRAM) 50 MG tablet Take 50 mg by mouth every 6 (six) hours as needed for severe pain.   Yes [provider]  ketoconazole (NIZORAL) 2 % cream Apply 1 application topically daily. Rub in well especially between toes Patient taking differently: Apply 1 application topically daily as needed for irritation. Rub in well especially between toes 03/29/20   Hoyt Koch,  MD  sennosides-docusate sodium (SENOKOT-S) 8.6-50 MG tablet Take 1 tablet by mouth 2 (two) times daily as needed for constipation. Patient not taking: Reported on 03/02/2021 02/26/21   Hoyt Koch, MD    Physical Exam: Vitals:   03/02/21 1848 03/02/21 1858 03/02/21 1930 03/02/21 2030  BP:  (!) 141/71 131/65 (!) 128/57  Pulse: 81 73 76 76  Resp:  18 18 18   Temp:      TempSrc:      SpO2: 100% 99% 98% 98%    Constitutional: NAD, calm, comfortable Eyes: PERRL, lids and conjunctivae normal ENMT: Mucous membranes are moist. Posterior pharynx clear of any exudate or lesions.Normal dentition.  Neck: normal, supple, no masses, no thyromegaly Respiratory: clear to auscultation bilaterally, no wheezing, no crackles. Normal respiratory effort. No accessory muscle use.  Cardiovascular: Regular rate and rhythm, no murmurs / rubs / gallops. No extremity edema. 2+ pedal pulses. No carotid bruits.  Abdomen: no tenderness, no masses palpated. No hepatosplenomegaly. Bowel sounds positive.  Musculoskeletal: no clubbing / cyanosis. No joint deformity upper and lower extremities. Good ROM, no contractures. Normal muscle tone.  Skin:    Neurologic: CN 2-12 grossly intact. Sensation intact, DTR normal. Strength 5/5 in all 4.  Psychiatric: Normal judgment and insight. Alert and oriented x 3. Normal mood.    Labs on Admission: I have personally reviewed following labs and imaging studies  CBC: Recent Labs  Lab 03/02/21 1618  WBC 7.6  NEUTROABS 4.9  HGB 12.4  HCT 40.1  MCV 80.7  PLT 725   Basic Metabolic Panel: Recent Labs  Lab 03/02/21 1618  NA 136  K 4.5  CL 98  CO2 29  GLUCOSE 125*  BUN 28*  CREATININE 1.12*  CALCIUM 9.4   GFR: Estimated Creatinine Clearance: 47.9 mL/min (A) (by C-G formula based on SCr of 1.12 mg/dL (H)). Liver Function Tests: Recent Labs  Lab 03/02/21 1618  AST 18  ALT 14  ALKPHOS 62  BILITOT 0.3  PROT 7.8  ALBUMIN 3.2*   No results for  input(s): LIPASE, AMYLASE in the last 168 hours. No results for input(s): AMMONIA in the last 168 hours. Coagulation Profile: No results for input(s): INR, PROTIME in the last 168 hours. Cardiac Enzymes: No results for input(s): CKTOTAL, CKMB, CKMBINDEX, TROPONINI in the last 168 hours. BNP (last 3 results) No results for input(s): PROBNP in the last 8760 hours. HbA1C: No results for input(s): HGBA1C in the last 72 hours. CBG: No results for input(s): GLUCAP in the last 168 hours. Lipid Profile: No results for input(s): CHOL, HDL, LDLCALC, TRIG, CHOLHDL, LDLDIRECT in the last 72 hours. Thyroid Function Tests: No results for input(s): TSH, T4TOTAL, FREET4, T3FREE, THYROIDAB in the last 72 hours. Anemia Panel: No results for input(s): VITAMINB12,  FOLATE, FERRITIN, TIBC, IRON, RETICCTPCT in the last 72 hours. Urine analysis:    Component Value Date/Time   COLORURINE YELLOW 01/26/2014 [place] 01/26/2014 1204   LABSPEC 1.015 01/26/2014 1204   PHURINE 6.5 01/26/2014 1204   GLUCOSEU NEGATIVE 01/26/2014 1204   HGBUR NEGATIVE 01/26/2014 [place] 01/26/2014 [place] 01/26/2014 1204   UROBILINOGEN 1.0 01/26/2014 1204   NITRITE NEGATIVE 01/26/2014 [place] 01/26/2014 1204    Radiological Exams on Admission: DG Tibia/Fibula Left  Result Date: 03/02/2021 CLINICAL DATA:  Left leg wound with redness and swelling x2 weeks. EXAM: LEFT TIBIA AND FIBULA - 2 VIEW COMPARISON:  None. FINDINGS: There is no evidence of fracture or other focal bone lesions. Diffuse soft tissue swelling is seen. IMPRESSION: 1. No acute osseous abnormality. 2. Diffuse soft tissue swelling. Electronically Signed   By: Virgina Norfolk M.D.   On: 03/02/2021 17:19   DG Foot Complete Left  Result Date: 03/02/2021 CLINICAL DATA:  Left leg wounds with redness and swelling x2 weeks. EXAM: LEFT FOOT - COMPLETE 3+ VIEW COMPARISON:  None. FINDINGS: There  is no evidence of fracture or dislocation. There is no evidence of arthropathy or other focal bone abnormality. Marked severity diffuse soft tissue swelling is noted. IMPRESSION: 1. No acute osseous abnormality. 2. Marked severity diffuse soft tissue swelling. Electronically Signed   By: Virgina Norfolk M.D.   On: 03/02/2021 17:20    EKG: Independently reviewed.  Assessment/Plan Principal Problem:   Cellulitis of left ankle Active Problems:   Diabetes mellitus type 2 with complications (HCC)   Essential hypertension   Chronic venous insufficiency    Cellulitis of L leg - with ulcer of posterior aspect of L ankle LE wound pathway Wound care consult Doesn't appear to be a wt bearing diabetic foot ulcer (see photo above). But given extent of cellulitis and presence of ulcer in setting of DM.  Would prefer to start seeing some improvement in cellulitis before any consideration of dalbavancin Therefore admitting to hospital Rocephin + flagyl MRSA PCR Nares (re-order vanc if positive or if cellulitis not rapidly improving). ABI and venous US of LE to look at reflux to see if anything that needs intervention. Consider curbside / consult of vasc surg re: venous stasis ulcer depending on results Edema - Cont home diuretics HTN - Cont losartan DM2 - Cont home DM meds CBG checks AC/HS  DVT prophylaxis: Lovenox Code Status: Full Family Communication: No family in room Disposition Plan: Home after cellulitis improved Consults called: None Admission status: Place in obs    Charrisse Masley, [place] [facility]  How to contact the [facility] Attending or Consulting provider [place] or covering provider during after hours [place], for this patient?  Check the care team in [facility] and look for a) attending/consulting [facility] provider listed and b) the [facility] team listed Log into [email or web address]  Amion Physician Scheduling and messaging for groups and whole hospitals  On call and physician scheduling  software for group practices, residents, hospitalists and other medical providers for call, clinic, rotation and shift schedules. On[employer] is a hospital-wide system for scheduling doctors and paging doctors on call. EasyPlot is for scientific plotting and data analysis.  [email or web address]  and use [place]'s universal password to access. If you do not have the password, please contact the hospital operator.  Locate the [facility] provider you are looking for under [facility] and page to a  number that you can be directly reached. If you still have difficulty reaching the provider, please page the [facility] (Director on Call) for the Hospitalists listed on amion for assistance.  03/02/2021, 9:26 PM

## 2021-03-02 NOTE — ED Triage Notes (Signed)
Pt reports wounds on left leg with swelling and redness x2 weeks. She reports going to PCP and was started on oral antibiotics. She states that the swelling and redness went down, but then became worse and was sent here.

## 2021-03-02 NOTE — Telephone Encounter (Signed)
Patient calling in  Says the cellulitis is not getting any better.. left leg is still swollen & patient says she now has a open wound  Offered OV at other [place] facility.. patient declined.. scheduled OV w/ [place] 11/23 @ 10:40  Wants call back from nurse to discuss how to care for open wound until ov  Please call [phone]

## 2021-03-02 NOTE — Telephone Encounter (Signed)
I think she needs to go to urgent care or ER for this as she may need IV antibiotics. I do not think it is okay to wait until next week.

## 2021-03-02 NOTE — ED Provider Notes (Addendum)
[place]    CSN: [id number] Arrival date & time: 03/02/21  1320      History   Chief Complaint Chief Complaint  Patient presents with   Leg Pain    left    HPI NAQUISHA WHITEHAIR is a 75 y.o. female presenting with cellulitis of left lower extremity.  Medical history uncontrolled diabetes.  This patient recently completed 10 days of Bactrim and Keflex for her left lower extremity cellulitis, states that symptoms improved minimally and her primary care told her to go to urgent care for IV antibiotics, so she is presenting here today.  Denies fever/chills.  HPI  Past Medical History:  Diagnosis Date   Chest pain, unspecified    Edema    peripheral   Hypertension    Type II or unspecified type diabetes mellitus without mention of complication, not stated as uncontrolled    Vertigo     Patient Active Problem List   Diagnosis Date Noted   Left leg cellulitis 02/23/2021   Chronic pain of right knee 07/01/2019   Hyperlipidemia associated with type 2 diabetes mellitus ([place]) 04/01/2019   Smoking 1/2 pack a day or less 09/22/2012   Morbid obesity ([place]) 10/17/2010   Encounter for general adult medical examination with abnormal findings 10/17/2010   Diabetes mellitus type 2 with complications ([place]) 83/66/2947   Essential hypertension 08/15/2007   Chronic venous insufficiency 08/15/2007   VERTIGO, HX OF 08/15/2007    Past Surgical History:  Procedure Laterality Date   COLONOSCOPY  [id number]   dialtion and curettage     TOTAL ABDOMINAL HYSTERECTOMY W/ BILATERAL SALPINGOOPHORECTOMY     due to fibroid tumors and uterine bleeding    OB History   No obstetric history on file.      Home Medications    Prior to Admission medications   Medication Sig Start Date End Date Taking? Authorizing Provider  cephALEXin (KEFLEX) 500 MG capsule Take 1 capsule (500 mg total) by mouth 2 (two) times daily. 02/20/21   Hoyt Koch, MD  glimepiride (AMARYL) 4 MG tablet  TAKE 1 TABLET(4 MG) BY MOUTH DAILY BEFORE AND BREAKFAST 01/23/21   Biagio Borg, MD  JARDIANCE 25 MG TABS tablet TAKE 1 TABLET BY MOUTH DAILY 09/19/20   Hoyt Koch, MD  ketoconazole (NIZORAL) 2 % cream Apply 1 application topically daily. Rub in well especially between toes 03/29/20   Hoyt Koch, MD  losartan (COZAAR) 50 MG tablet TAKE 1 TABLET(50 MG) BY MOUTH DAILY 09/19/20   Hoyt Koch, MD  metFORMIN (GLUCOPHAGE) 1000 MG tablet TAKE 1 TABLET(1000 MG) BY MOUTH TWICE DAILY WITH A MEAL 02/22/21   Hoyt Koch, MD  ondansetron (ZOFRAN) 4 MG tablet Take 1 tablet (4 mg total) by mouth every 8 (eight) hours as needed for nausea or vomiting. 02/20/21   Hoyt Koch, MD  pioglitazone (ACTOS) 45 MG tablet Take 1 tablet (45 mg total) by mouth daily. 02/26/21   Hoyt Koch, MD  rosuvastatin (CRESTOR) 10 MG tablet TAKE 1 TABLET(10 MG) BY MOUTH DAILY 02/22/21   Hoyt Koch, MD  sennosides-docusate sodium (SENOKOT-S) 8.6-50 MG tablet Take 1 tablet by mouth 2 (two) times daily as needed for constipation. 02/26/21   Hoyt Koch, MD  sulfamethoxazole-trimethoprim (BACTRIM DS) 800-160 MG tablet Take 1 tablet by mouth 2 (two) times daily. 02/26/21   Hoyt Koch, MD  torsemide (DEMADEX) 20 MG tablet TAKE 1 TABLET(20 MG) BY MOUTH DAILY 12/15/20  Hoyt Koch, MD    Family History Family History  Problem Relation Age of Onset   Hyperlipidemia Mother    Diabetes Mother    Heart disease Mother    Breast cancer Neg Hx    Colon cancer Neg Hx    Stroke Neg Hx    Amblyopia Neg Hx    Blindness Neg Hx    Cataracts Neg Hx    Glaucoma Neg Hx    Macular degeneration Neg Hx    Retinal detachment Neg Hx    Strabismus Neg Hx    Retinitis pigmentosa Neg Hx     Social History Social History   Tobacco Use   Smoking status: Every Day    Packs/day: 0.50    Years: 30.00    Pack years: 15.00    Types: Cigarettes   Smokeless  tobacco: Never   Tobacco comments:    pt states she has smoked for a long time  Vaping Use   Vaping Use: Never used  Substance Use Topics   Alcohol use: No   Drug use: No     Allergies   Patient has no known allergies.   Review of Systems Review of Systems  Skin:  Positive for rash.  All other systems reviewed and are negative.   Physical Exam Triage Vital Signs ED Triage Vitals  Enc Vitals Group     BP 03/02/21 1439 128/80     Pulse Rate 03/02/21 1439 80     Resp 03/02/21 1439 17     Temp 03/02/21 1439 97.7 F (36.5 C)     Temp Source 03/02/21 1439 Oral     SpO2 03/02/21 1439 95 %     Weight --      Height --      Head Circumference --      Peak Flow --      Pain Score 03/02/21 1438 8     Pain Loc --      Pain Edu? --      Excl. in [place]? --    No data found.  Updated Vital Signs BP 128/80 (BP Location: Right Arm)   Pulse 80   Temp 97.7 F (36.5 C) (Oral)   Resp 17   SpO2 95%   Visual Acuity Right Eye Distance:   Left Eye Distance:   Bilateral Distance:    Right Eye Near:   Left Eye Near:    Bilateral Near:     Physical Exam Vitals reviewed.  Constitutional:      General: She is not in acute distress.    Appearance: Normal appearance. She is not ill-appearing or diaphoretic.  HENT:     Head: Normocephalic and atraumatic.  Cardiovascular:     Rate and Rhythm: Normal rate and regular rhythm.     Heart sounds: Normal heart sounds.  Pulmonary:     Effort: Pulmonary effort is normal.     Breath sounds: Normal breath sounds.  Skin:    General: Skin is warm.     Comments: See image below L LE is diffusely erythematous, edematous and indurated. TTP. Serous discharge.   Neurological:     General: No focal deficit present.     Mental Status: She is alert and oriented to person, place, and time.  Psychiatric:        Mood and Affect: Mood normal.        Behavior: Behavior normal.        Thought Content: Thought content normal.  Judgment:  Judgment normal.       UC Treatments / Results  Labs (all labs ordered are listed, but only abnormal results are displayed) Labs Reviewed - No data to display  EKG   Radiology No results found.  Procedures Procedures (including critical care time)  Medications Ordered in UC Medications - No data to display  Initial Impression / Assessment and Plan / UC Course  I have reviewed the triage vital signs and the nursing notes.  Pertinent labs & imaging results that were available during my care of the patient were reviewed by me and considered in my medical decision making (see chart for details).     This patient is a very pleasant 75 y.o. year old female presenting with cellulitis L LE related to uncontrolled diabetes. Completed Keflex and Bactrim for this today, without resolution. Was advised by PCP to go to urgent care for IV antibiotics. I am in agreement that she requires IV antibiotics, but we are unable to administer them at this urgent care. Sent to ED via personal vehicle. Patient is in agreement.   Level 5 as this patient was admitted.   Final Clinical Impressions(s) / UC Diagnoses   Final diagnoses:  Cellulitis of left lower extremity     Discharge Instructions      -Please head to the ED for IV antibiotics. We are not able to administer IV antibiotics.   ED Prescriptions   None    PDMP not reviewed this encounter.   Hazel Sams, PA-C 03/02/21 1455    Hazel Sams, PA-C 03/03/21 1017

## 2021-03-02 NOTE — ED Provider Notes (Signed)
Emergency Medicine Provider Triage Evaluation Note  Cheryl Delgado , a 75 y.o. female  was evaluated in triage.  Pt complains of leg pain and swelling and redness on left leg.  This has been present for about 2 weeks.  She took PO bactrim and keflex which improved some but didn't resolve and it got worse.  No fevers.   Review of Systems  Positive: Foot infection,  Negative: fevers  Physical Exam  BP (!) 147/58 (BP Location: Right Arm)   Pulse 82   Temp 98.9 F (37.2 C) (Oral)   Resp 16   SpO2 100%  Gen:   Awake, no distress   Resp:  Normal effort  MSK:   Moves extremities without difficulty  Other:  Obvious wounds on LLE.  Left foot is warm with capillary refill under 2 seconds.  She is able to wiggle her toes and feel me touching her foot.  Weeping wounds on the left leg.  Medical Decision Making  Medically screening exam initiated at 4:14 PM.  Appropriate orders placed.  VICIE CECH was informed that the remainder of the evaluation will be completed by another provider, this initial triage assessment does not replace that evaluation, and the importance of remaining in the ED until their evaluation is complete.  Will obtain plain films to evaluate for subcutaneous emphysema.  Labs ordered.  As anticipate admission COVID testing is ordered.  I discussed with patient the importance of staying for full evaluation and she states her understanding.   Lorin Glass, PA-C 03/02/21 1625    Regan Lemming, MD 03/02/21 2032

## 2021-03-02 NOTE — Discharge Instructions (Addendum)
-  Please head to the ED for IV antibiotics. We are not able to administer IV antibiotics.

## 2021-03-02 NOTE — Telephone Encounter (Signed)
Called pt. LDVM with Dr. Nathanial Millman recommendations. Office number was provided   If pt calls back please advise her per Dr. Sharlet Salina she needs to go UC or the ED for IV antibiotics.

## 2021-03-03 ENCOUNTER — Encounter (HOSPITAL_COMMUNITY): Payer: Medicare HMO

## 2021-03-03 ENCOUNTER — Encounter (HOSPITAL_COMMUNITY): Payer: Self-pay | Admitting: Internal Medicine

## 2021-03-03 ENCOUNTER — Observation Stay (HOSPITAL_COMMUNITY): Payer: Medicare HMO

## 2021-03-03 ENCOUNTER — Observation Stay (HOSPITAL_BASED_OUTPATIENT_CLINIC_OR_DEPARTMENT_OTHER): Payer: Medicare HMO

## 2021-03-03 DIAGNOSIS — Z20822 Contact with and (suspected) exposure to covid-19: Secondary | ICD-10-CM | POA: Diagnosis present

## 2021-03-03 DIAGNOSIS — Z7984 Long term (current) use of oral hypoglycemic drugs: Secondary | ICD-10-CM | POA: Diagnosis not present

## 2021-03-03 DIAGNOSIS — Z8249 Family history of ischemic heart disease and other diseases of the circulatory system: Secondary | ICD-10-CM | POA: Diagnosis not present

## 2021-03-03 DIAGNOSIS — I872 Venous insufficiency (chronic) (peripheral): Secondary | ICD-10-CM | POA: Diagnosis present

## 2021-03-03 DIAGNOSIS — E875 Hyperkalemia: Secondary | ICD-10-CM | POA: Diagnosis present

## 2021-03-03 DIAGNOSIS — R609 Edema, unspecified: Secondary | ICD-10-CM | POA: Diagnosis not present

## 2021-03-03 DIAGNOSIS — F17210 Nicotine dependence, cigarettes, uncomplicated: Secondary | ICD-10-CM | POA: Diagnosis present

## 2021-03-03 DIAGNOSIS — Z833 Family history of diabetes mellitus: Secondary | ICD-10-CM | POA: Diagnosis not present

## 2021-03-03 DIAGNOSIS — E785 Hyperlipidemia, unspecified: Secondary | ICD-10-CM | POA: Diagnosis present

## 2021-03-03 DIAGNOSIS — L0390 Cellulitis, unspecified: Secondary | ICD-10-CM

## 2021-03-03 DIAGNOSIS — Z8349 Family history of other endocrine, nutritional and metabolic diseases: Secondary | ICD-10-CM | POA: Diagnosis not present

## 2021-03-03 DIAGNOSIS — R5381 Other malaise: Secondary | ICD-10-CM | POA: Diagnosis present

## 2021-03-03 DIAGNOSIS — Z6839 Body mass index (BMI) 39.0-39.9, adult: Secondary | ICD-10-CM | POA: Diagnosis not present

## 2021-03-03 DIAGNOSIS — I10 Essential (primary) hypertension: Secondary | ICD-10-CM | POA: Diagnosis present

## 2021-03-03 DIAGNOSIS — Z90710 Acquired absence of both cervix and uterus: Secondary | ICD-10-CM | POA: Diagnosis not present

## 2021-03-03 DIAGNOSIS — E118 Type 2 diabetes mellitus with unspecified complications: Secondary | ICD-10-CM | POA: Diagnosis present

## 2021-03-03 DIAGNOSIS — Z79899 Other long term (current) drug therapy: Secondary | ICD-10-CM | POA: Diagnosis not present

## 2021-03-03 DIAGNOSIS — R600 Localized edema: Secondary | ICD-10-CM | POA: Diagnosis present

## 2021-03-03 DIAGNOSIS — L03116 Cellulitis of left lower limb: Secondary | ICD-10-CM | POA: Diagnosis present

## 2021-03-03 LAB — BASIC METABOLIC PANEL
Anion gap: 6 (ref 5–15)
BUN: 25 mg/dL — ABNORMAL HIGH (ref 8–23)
CO2: 29 mmol/L (ref 22–32)
Calcium: 8.6 mg/dL — ABNORMAL LOW (ref 8.9–10.3)
Chloride: 100 mmol/L (ref 98–111)
Creatinine, Ser: 1.13 mg/dL — ABNORMAL HIGH (ref 0.44–1.00)
GFR, Estimated: 51 mL/min — ABNORMAL LOW (ref >60)
Glucose, Bld: 173 mg/dL — ABNORMAL HIGH (ref 70–99)
Potassium: 5.2 mmol/L — ABNORMAL HIGH (ref 3.5–5.1)
Sodium: 135 mmol/L (ref 135–145)

## 2021-03-03 LAB — PREALBUMIN: Prealbumin: 19.1 mg/dL (ref 18–38)

## 2021-03-03 LAB — GLUCOSE, CAPILLARY
Glucose-Capillary: 165 mg/dL — ABNORMAL HIGH (ref 70–99)
Glucose-Capillary: 148 mg/dL — ABNORMAL HIGH (ref 70–99)

## 2021-03-03 LAB — CBC
HCT: 35.7 % — ABNORMAL LOW (ref 36.0–46.0)
Hemoglobin: 11 g/dL — ABNORMAL LOW (ref 12.0–15.0)
MCH: 24.9 pg — ABNORMAL LOW (ref 26.0–34.0)
MCHC: 30.8 g/dL (ref 30.0–36.0)
MCV: 81 fL (ref 80.0–100.0)
Platelets: 330 K/uL (ref 150–400)
RBC: 4.41 MIL/uL (ref 3.87–5.11)
RDW: 17.1 % — ABNORMAL HIGH (ref 11.5–15.5)
WBC: 7.3 K/uL (ref 4.0–10.5)
nRBC: 0 % (ref 0.0–0.2)

## 2021-03-03 LAB — CBG MONITORING, ED
Glucose-Capillary: 165 mg/dL — ABNORMAL HIGH (ref 70–99)
Glucose-Capillary: 213 mg/dL — ABNORMAL HIGH (ref 70–99)

## 2021-03-03 LAB — HEMOGLOBIN A1C
Hgb A1c MFr Bld: 9.7 % — ABNORMAL HIGH (ref 4.8–5.6)
Mean Plasma Glucose: 231.69 mg/dL

## 2021-03-03 LAB — C-REACTIVE PROTEIN: CRP: 1.9 mg/dL — ABNORMAL HIGH (ref <1.0)

## 2021-03-03 MED ORDER — INSULIN ASPART 100 UNIT/ML IJ SOLN
0.0000 [IU] | Freq: Three times a day (TID) | INTRAMUSCULAR | Status: DC
  Administered 2021-03-03: 3 [IU] via SUBCUTANEOUS
  Administered 2021-03-03 – 2021-03-04 (×2): 2 [IU] via SUBCUTANEOUS
  Administered 2021-03-04 (×2): 1 [IU] via SUBCUTANEOUS
  Administered 2021-03-05: 2 [IU] via SUBCUTANEOUS
  Administered 2021-03-05: 3 [IU] via SUBCUTANEOUS
  Filled 2021-03-03: qty 0.09

## 2021-03-03 MED ORDER — SODIUM ZIRCONIUM CYCLOSILICATE 5 G PO PACK
5.0000 g | PACK | Freq: Once | ORAL | Status: AC
Start: 2021-03-03 — End: 2021-03-03
  Administered 2021-03-03: 5 g via ORAL
  Filled 2021-03-03: qty 1

## 2021-03-03 MED ORDER — FUROSEMIDE 10 MG/ML IJ SOLN
40.0000 mg | Freq: Two times a day (BID) | INTRAMUSCULAR | Status: DC
  Administered 2021-03-03 – 2021-03-04 (×4): 40 mg via INTRAVENOUS
  Filled 2021-03-03 (×4): qty 4

## 2021-03-03 MED ORDER — VANCOMYCIN HCL 750 MG/150ML IV SOLN
750.0000 mg | INTRAVENOUS | Status: DC
  Administered 2021-03-03 – 2021-03-04 (×2): 750 mg via INTRAVENOUS
  Filled 2021-03-03 (×4): qty 150

## 2021-03-03 MED ORDER — INSULIN ASPART 100 UNIT/ML IJ SOLN
0.0000 [IU] | Freq: Every day | INTRAMUSCULAR | Status: DC
  Administered 2021-03-04: 2 [IU] via SUBCUTANEOUS
  Filled 2021-03-03: qty 0.05

## 2021-03-03 NOTE — Progress Notes (Signed)
PROGRESS NOTE    Cheryl Delgado  ZJQ:[id number] DOB: April 17, 1945 DOA: 03/02/2021 PCP: Hoyt Koch, MD   Chief Complain:Leg infection  Brief Narrative:  Patient is a 75 year old female with history of diabetes type 2, hypertension, peripheral edema who presented with 2-week history of lower extremity erythema, swelling, edema.  She was taking Keflex and Bactrim as an outpatient as given by her PCP but is not helping.  She was sent by her PCP to the emergency department for IV antibiotic.  She was started on ceftriaxone and vancomycin in the emergency department and currently being managed for cellulitis of left lower extremity.   Assessment & Plan:   Principal Problem:   Cellulitis of left ankle Active Problems:   Diabetes mellitus type 2 with complications (HCC)   Essential hypertension   Chronic venous insufficiency   Left lower extremity cellulitis: Has a small ulcer on the posterior aspect of left ankle.  Wound care consulted.  Presented with worsening pain, erythema, edema.  She was taking Keflex and Bactrim at home which did not help.  Currently on ceftriaxone and vancomycin. ABI did not show any significant artery disease.  We will also check venous Doppler to rule out DVT. Patient has history of chronic venous insufficiency and possibly bilateral lymphedema. Blood culture sent She takes torsemide 20 mg daily at home for chronic bilateral lower extremity edema.  She has severe edema of left lower extremity and also right lower extremity.  We will start on Lasix 40 mg twice daily IV. She needs to follow-up with vein clinic/lymphedema clinic as an outpatient after discharge  History of hypertension: On losartan at home.  Continue monitoring blood pressure.  Continue current medication  Diabetes type 2: Monitor blood sugars.  Continue sliding-scale insulin.  Takes glimepiride, Jardiance, metformin, pioglitazone at home.  Hyperlipidemia: Takes Crestor  Mild  hyperkalemia: Potassium of 5.2.  Given  a dose of Lokelma.  Monitor BMP  Debility/deconditioning: We will request PT/OT evaluation.  Patient lives at home has ambulatory problems.  Morbid obesity: BMI 39.1.        DVT prophylaxis:Lovenox Code Status: Full Family Communication: None at bedside.  Patient saying she is talking with her family Status is: Observation   Consultants: None  Procedures:None  Antimicrobials:  Anti-infectives (From admission, onward)    Start     Dose/Rate Route Frequency Ordered Stop   03/03/21 1000  metroNIDAZOLE (FLAGYL) tablet 500 mg       See Hyperspace for full Linked Orders Report.   500 mg Oral Every 12 hours 03/02/21 2113 03/10/21 0959   03/03/21 0800  cefTRIAXone (ROCEPHIN) 2 g in sodium chloride 0.9 % 100 mL IVPB       See Hyperspace for full Linked Orders Report.   2 g 200 mL/hr over 30 Minutes Intravenous Every 24 hours 03/02/21 2113 03/10/21 0759   03/02/21 1945  vancomycin (VANCOCIN) IVPB 1000 mg/200 mL premix        1,000 mg 200 mL/hr over 60 Minutes Intravenous  Once 03/02/21 1939 03/02/21 2140   03/02/21 1945  ceFEPIme (MAXIPIME) 2 g in sodium chloride 0.9 % 100 mL IVPB        2 g 200 mL/hr over 30 Minutes Intravenous  Once 03/02/21 1939 03/02/21 2020       Subjective:  Patient seen and examined at the bedside this morning in the emergency department.  She appears overall comfortable.  Hemodynamically stable.  She was able to take her breakfast.  She denies  any  worsening pain on the lower extremities.  Left lower extremity appears erythematous, edematous  objective: Vitals:   03/02/21 2030 03/02/21 2130 03/03/21 0110 03/03/21 0428  BP: (!) 128/57 (!) 142/55 (!) 196/82 (!) 114/53  Pulse: 76 79 (!) 106 84  Resp: 18 18 16 16   Temp:      TempSrc:      SpO2: 98% 97% 97% 92%   No intake or output data in the 24 hours ending 03/03/21 0818 There were no vitals filed for this visit.  Examination:  General exam: Overall  comfortable, not in distress, morbidly obese HEENT: PERRL Respiratory system:  no wheezes or crackles  Cardiovascular system: S1 & S2 heard, RRR.  Gastrointestinal system: Abdomen is nondistended, soft and nontender. Central nervous system: Alert and oriented Extremities: Bilateral lower extremity edema/lymphedema more on the left, no clubbing ,no cyanosis Skin:        Data Reviewed: I have personally reviewed following labs and imaging studies  CBC: Recent Labs  Lab 03/02/21 1618 03/03/21 0500  WBC 7.6 7.3  NEUTROABS 4.9  --   HGB 12.4 11.0*  HCT 40.1 35.7*  MCV 80.7 81.0  PLT 350 644   Basic Metabolic Panel: Recent Labs  Lab 03/02/21 1618 03/03/21 0500  NA 136 135  K 4.5 5.2*  CL 98 100  CO2 29 29  GLUCOSE 125* 173*  BUN 28* 25*  CREATININE 1.12* 1.13*  CALCIUM 9.4 8.6*   GFR: Estimated Creatinine Clearance: 47.5 mL/min (A) (by C-G formula based on SCr of 1.13 mg/dL (H)). Liver Function Tests: Recent Labs  Lab 03/02/21 1618  AST 18  ALT 14  ALKPHOS 62  BILITOT 0.3  PROT 7.8  ALBUMIN 3.2*   No results for input(s): LIPASE, AMYLASE in the last 168 hours. No results for input(s): AMMONIA in the last 168 hours. Coagulation Profile: No results for input(s): INR, PROTIME in the last 168 hours. Cardiac Enzymes: No results for input(s): CKTOTAL, CKMB, CKMBINDEX, TROPONINI in the last 168 hours. BNP (last 3 results) No results for input(s): PROBNP in the last 8760 hours. HbA1C: Recent Labs    03/02/21 1618  HGBA1C 9.7*   CBG: Recent Labs  Lab 03/02/21 2142 03/03/21 0814  GLUCAP 148* 165*   Lipid Profile: No results for input(s): CHOL, HDL, LDLCALC, TRIG, CHOLHDL, LDLDIRECT in the last 72 hours. Thyroid Function Tests: No results for input(s): TSH, T4TOTAL, FREET4, T3FREE, THYROIDAB in the last 72 hours. Anemia Panel: No results for input(s): VITAMINB12, FOLATE, FERRITIN, TIBC, IRON, RETICCTPCT in the last 72 hours. Sepsis Labs: Recent Labs   Lab 03/02/21 1618 03/02/21 1844  LATICACIDVEN 1.5 1.8    Recent Results (from the past 240 hour(s))  Resp Panel by RT-PCR (Flu A&B, Covid) Nasopharyngeal Swab     Status: None   Collection Time: 03/02/21  6:44 PM   Specimen: Nasopharyngeal Swab; Nasopharyngeal(NP) swabs in vial transport medium  Result Value Ref Range Status   SARS Coronavirus 2 by RT PCR NEGATIVE NEGATIVE Final    Comment: (NOTE) SARS-CoV-2 target nucleic acids are NOT DETECTED.  The SARS-CoV-2 RNA is generally detectable in upper respiratory specimens during the acute phase of infection. The lowest concentration of SARS-CoV-2 viral copies this assay can detect is 138 copies/mL. A negative result does not preclude SARS-Cov-2 infection and should not be used as the sole basis for treatment or other patient management decisions. A negative result may occur with  improper specimen collection/handling, submission of specimen other than nasopharyngeal swab,  presence of viral mutation(s) within the areas targeted by this assay, and inadequate number of viral copies(<138 copies/mL). A negative result must be combined with clinical observations, patient history, and epidemiological information. The expected result is Negative.  Fact Sheet for Patients:  EntrepreneurPulse.com.au  Fact Sheet for Healthcare Providers:  IncredibleEmployment.be  This test is no t yet approved or cleared by the [place] FDA and  has been authorized for detection and/or diagnosis of SARS-CoV-2 by FDA under an Emergency Use Authorization (EUA). This EUA will remain  in effect (meaning this test can be used) for the duration of the COVID-19 declaration under Section 564(b)(1) of the Act, 21 U.S.C.section 360bbb-3(b)(1), unless the authorization is terminated  or revoked sooner.       Influenza A by PCR NEGATIVE NEGATIVE Final   Influenza B by PCR NEGATIVE NEGATIVE Final    Comment: (NOTE) The  Xpert Xpress SARS-CoV-2/FLU/RSV plus assay is intended as an aid in the diagnosis of influenza from Nasopharyngeal swab specimens and should not be used as a sole basis for treatment. Nasal washings and aspirates are unacceptable for Xpert Xpress SARS-CoV-2/FLU/RSV testing.  Fact Sheet for Patients: EntrepreneurPulse.com.au  Fact Sheet for Healthcare Providers: IncredibleEmployment.be  This test is not yet approved or cleared by the [place] FDA and has been authorized for detection and/or diagnosis of SARS-CoV-2 by FDA under an Emergency Use Authorization (EUA). This EUA will remain in effect (meaning this test can be used) for the duration of the COVID-19 declaration under Section 564(b)(1) of the Act, 21 U.S.C. section 360bbb-3(b)(1), unless the authorization is terminated or revoked.  Performed at [facility], [place] [address] [place], [place] [address]   MRSA Next Gen by PCR, Nasal     Status: None   Collection Time: 03/02/21  9:42 PM   Specimen: Nasal Mucosa; Nasal Swab  Result Value Ref Range Status   MRSA by PCR Next Gen NOT DETECTED NOT DETECTED Final    Comment: (NOTE) The GeneXpert MRSA Assay (FDA approved for NASAL specimens only), is one component of a comprehensive MRSA colonization surveillance program. It is not intended to diagnose MRSA infection nor to guide or monitor treatment for MRSA infections. Test performance is not FDA approved in patients less than 54 years old. Performed at [facility], [place] [address], [place], [place] [address]          Radiology Studies: DG Tibia/Fibula Left  Result Date: 03/02/2021 CLINICAL DATA:  Left leg wound with redness and swelling x2 weeks. EXAM: LEFT TIBIA AND FIBULA - 2 VIEW COMPARISON:  None. FINDINGS: There is no evidence of fracture or other focal bone lesions. Diffuse soft tissue swelling is seen. IMPRESSION: 1. No acute osseous  abnormality. 2. Diffuse soft tissue swelling. Electronically Signed   By: Virgina Norfolk M.D.   On: 03/02/2021 17:19   DG Foot Complete Left  Result Date: 03/02/2021 CLINICAL DATA:  Left leg wounds with redness and swelling x2 weeks. EXAM: LEFT FOOT - COMPLETE 3+ VIEW COMPARISON:  None. FINDINGS: There is no evidence of fracture or dislocation. There is no evidence of arthropathy or other focal bone abnormality. Marked severity diffuse soft tissue swelling is noted. IMPRESSION: 1. No acute osseous abnormality. 2. Marked severity diffuse soft tissue swelling. Electronically Signed   By: Virgina Norfolk M.D.   On: 03/02/2021 17:20        Scheduled Meds:  empagliflozin  25 mg Oral Daily   enoxaparin (LOVENOX) injection  40 mg  Subcutaneous Q24H   glimepiride  4 mg Oral Q breakfast   losartan  50 mg Oral Daily   metFORMIN  1,000 mg Oral BID WC   metroNIDAZOLE  500 mg Oral Q12H   pioglitazone  30 mg Oral Daily   rosuvastatin  10 mg Oral Daily   torsemide  20 mg Oral Daily   Continuous Infusions:  cefTRIAXone (ROCEPHIN)  IV       LOS: 0 days    Time spent: 35 mins.More than 50% of that time was spent in counseling and/or coordination of care.      Shelly Coss, MD [facility] P11/19/2022, 8:18 AM

## 2021-03-03 NOTE — Progress Notes (Signed)
Patient arrived to unit via stretcher. Escorted to hospital bed. Vital signs stable. Heart and lungs are WNL. Last BM on the 17th. Skin intact, except the following: left lower extremity has a small posterior ulcer and anterior blister with erythema covering entire lower leg with +3 edema, right lower extremity has small amount of erythema on anterior portion of leg with +2 edema.   Patient lives at home with spouse. Smokes 5 cigarettes per day. Pt had 3 covid vaccines and flu shot. Using [facility] on [address] for medication pick up. States she has a history of diabetes and hypertension that is controlled with medication.   Explained use of call light, hospital phone, ordering food, and plan of care. No further needs at this time. Will continue to monitor.

## 2021-03-03 NOTE — Progress Notes (Signed)
Pharmacy Antibiotic Note  Cheryl Delgado is a 75 y.o. female admitted on 03/02/2021 with cellulitis of L leg.  Pharmacy has been consulted for vancomycin dosing.  Vancomycin 1g given 11/18 at 20:28  Plan: Vancomycin 750mg  IV q24h for estimated AUC 467 using SCr 1.13, Vd 0.5 L/kg Check vancomycin levels at steady state, goal AUC 400-550 Follow up renal function & cultures CTX/Flagyl per MD   Temp (24hrs), Avg:98.3 F (36.8 C), Min:97.7 F (36.5 C), Max:98.9 F (37.2 C)  Recent Labs  Lab 03/02/21 1618 03/02/21 1844 03/03/21 0500  WBC 7.6  --  7.3  CREATININE 1.12*  --  1.13*  LATICACIDVEN 1.5 1.8  --     Estimated Creatinine Clearance: 47.5 mL/min (A) (by C-G formula based on SCr of 1.13 mg/dL (H)).    No Known Allergies  Antimicrobials this admission: 11/18 vanc/cefepime x1, resume vanc 11/19 >> 11/19 CTX>> 11/19 Flagyl>>  Dose adjustments this admission:   Microbiology results: None  Thank you for allowing pharmacy to be a part of this patient's care.  Peggyann Juba, PharmD, BCPS Pharmacy: [phone] 03/03/2021 10:38 AM

## 2021-03-03 NOTE — Progress Notes (Signed)
Inpatient Diabetes Program Recommendations  AACE/ADA: New Consensus Statement on Inpatient Glycemic Control (2015)  Target Ranges:  Prepandial:   less than 140 mg/dL      Peak postprandial:   less than 180 mg/dL (1-2 hours)      Critically ill patients:  140 - 180 mg/dL   Lab Results  Component Value Date   GLUCAP 165 (H) 03/03/2021   HGBA1C 9.7 (H) 03/02/2021    Review of Glycemic Control  Latest Reference Range & Units 03/02/21 21:42 03/03/21 08:14  Glucose-Capillary 70 - 99 mg/dL 148 (H) 165 (H)   Diabetes history: DM 2 Outpatient Diabetes medications:  Amaryl 4 mg daily Jardiance 25 mg daily Metformin 1000 mg bid Actos 30 mg daily Current orders for Inpatient glycemic control:  Jardiance 25 mg daily, Amaryl 4 mg daily, Metformin 1000 mg bid, Actos 30 mg daily  Inpatient Diabetes Program Recommendations:    Consider holding oral DM agents while in the hospital and add Novolog correction tid with meals and HS- moderate.    Thanks,  Adah Perl, RN, BC-ADM Inpatient Diabetes Coordinator Pager [phone]  (8a-5p)

## 2021-03-03 NOTE — Progress Notes (Signed)
ABI's have been completed. Preliminary results can be found in CV Proc through chart review.   03/03/21 9:34 AM Carlos Levering RVT

## 2021-03-03 NOTE — ED Notes (Signed)
PT sitting up in chair at bedside. Advised pt that due to leg swelling she shouldn't keep them hanging down for long periods of time.

## 2021-03-03 NOTE — Progress Notes (Signed)
Left lower extremity venous duplex has been completed. Preliminary results can be found in CV Proc through chart review.   03/03/21 12:35 PM Cheryl Delgado RVT

## 2021-03-04 DIAGNOSIS — L03116 Cellulitis of left lower limb: Secondary | ICD-10-CM | POA: Diagnosis not present

## 2021-03-04 LAB — BASIC METABOLIC PANEL
Anion gap: 8 (ref 5–15)
BUN: 26 mg/dL — ABNORMAL HIGH (ref 8–23)
CO2: 30 mmol/L (ref 22–32)
Calcium: 9 mg/dL (ref 8.9–10.3)
Chloride: 98 mmol/L (ref 98–111)
Creatinine, Ser: 1.06 mg/dL — ABNORMAL HIGH (ref 0.44–1.00)
GFR, Estimated: 55 mL/min — ABNORMAL LOW (ref >60)
Glucose, Bld: 157 mg/dL — ABNORMAL HIGH (ref 70–99)
Potassium: 4.5 mmol/L (ref 3.5–5.1)
Sodium: 136 mmol/L (ref 135–145)

## 2021-03-04 LAB — CBC WITH DIFFERENTIAL/PLATELET
Abs Immature Granulocytes: 0.02 10*3/uL (ref 0.00–0.07)
Basophils Absolute: 0.1 10*3/uL (ref 0.0–0.1)
Basophils Relative: 1 %
Eosinophils Absolute: 0.2 10*3/uL (ref 0.0–0.5)
Eosinophils Relative: 2 %
HCT: 36.1 % (ref 36.0–46.0)
Hemoglobin: 11.3 g/dL — ABNORMAL LOW (ref 12.0–15.0)
Immature Granulocytes: 0 %
Lymphocytes Relative: 40 %
Lymphs Abs: 2.6 10*3/uL (ref 0.7–4.0)
MCH: 25.3 pg — ABNORMAL LOW (ref 26.0–34.0)
MCHC: 31.3 g/dL (ref 30.0–36.0)
MCV: 80.9 fL (ref 80.0–100.0)
Monocytes Absolute: 0.7 10*3/uL (ref 0.1–1.0)
Monocytes Relative: 10 %
Neutro Abs: 2.9 10*3/uL (ref 1.7–7.7)
Neutrophils Relative %: 47 %
Platelets: 326 10*3/uL (ref 150–400)
RBC: 4.46 MIL/uL (ref 3.87–5.11)
RDW: 17.2 % — ABNORMAL HIGH (ref 11.5–15.5)
WBC: 6.4 10*3/uL (ref 4.0–10.5)
nRBC: 0 % (ref 0.0–0.2)

## 2021-03-04 LAB — GLUCOSE, CAPILLARY
Glucose-Capillary: 135 mg/dL — ABNORMAL HIGH (ref 70–99)
Glucose-Capillary: 170 mg/dL — ABNORMAL HIGH (ref 70–99)
Glucose-Capillary: 146 mg/dL — ABNORMAL HIGH (ref 70–99)
Glucose-Capillary: 217 mg/dL — ABNORMAL HIGH (ref 70–99)

## 2021-03-04 MED ORDER — JUVEN PO PACK
1.0000 | PACK | Freq: Two times a day (BID) | ORAL | Status: DC
  Administered 2021-03-05: 1 via ORAL
  Filled 2021-03-04 (×2): qty 1

## 2021-03-04 MED ORDER — ADULT MULTIVITAMIN W/MINERALS CH
1.0000 | ORAL_TABLET | Freq: Every day | ORAL | Status: DC
Start: 2021-03-04 — End: 2021-03-05
  Administered 2021-03-04 – 2021-03-05 (×2): 1 via ORAL
  Filled 2021-03-04 (×2): qty 1

## 2021-03-04 NOTE — Evaluation (Signed)
Physical Therapy Evaluation-1x Patient Details Name: Cheryl Delgado MRN: [id number] DOB: 06-Apr-1946 Today's Date: 03/04/2021  History of Present Illness  Patient is a 75 year old female admitted with L ankle cellulitis. History of diabetes type 2, hypertension, peripheral edema  Clinical Impression  On eval, pt was Supv-Mod Ind with mobility. She walked ~115 around the unit. Gait is antalgic but improved as ambulation distance increased. Pt politely declines use of an assistive device (straight cane could possibly help with pain control when ambulating). No overt LOB during session. Do not anticipate any further PT needs at this time. 1x eval. Will sign off.      Recommendations for follow up therapy are one component of a multi-disciplinary discharge planning process, led by the attending physician.  Recommendations may be updated based on patient status, additional functional criteria and insurance authorization.  Follow Up Recommendations No PT follow up    Assistance Recommended at Discharge    Functional Status Assessment    Equipment Recommendations  None recommended by PT    Recommendations for Other Services       Precautions / Restrictions Precautions Precautions: Fall Restrictions Weight Bearing Restrictions: No      Mobility  Bed Mobility Overal bed mobility: Modified Independent                  Transfers Overall transfer level: Modified independent                      Ambulation/Gait Ambulation/Gait assistance: Modified independent (Device/Increase time);Supervision Gait Distance (Feet): 115 Feet Assistive device: None (vs hallway handrail/furniture walking) Gait Pattern/deviations: Step-to pattern;Antalgic       General Gait Details: Moderately antalgic gait pattern initially with pt intermittently "furniture walking"/using hallway handrail to steady. As distance progressed, gait less antalgic and pt ambulating without UE  support  Stairs            Wheelchair Mobility    Modified Rankin (Stroke Patients Only)       Balance Overall balance assessment: Mild deficits observed, not formally tested                                           Pertinent Vitals/Pain Pain Assessment: 0-10 Pain Score: 5  Pain Location: left ankle with dependent positioning and/or weightbearing Pain Descriptors / Indicators: Discomfort;Sore Pain Intervention(s): Monitored during session    Home Living Family/patient expects to be discharged to:: Private residence Living Arrangements: Spouse/significant other Available Help at Discharge: Family;Available 24 hours/day Type of Home: House Home Access: Stairs to enter Entrance Stairs-Rails: Psychiatric nurse of Steps: 2   Home Layout: One level Home Equipment: Shower seat      Prior Function Prior Level of Function : Independent/Modified Independent             Mobility Comments: no device ADLs Comments: independent     Hand Dominance        Extremity/Trunk Assessment   Upper Extremity Assessment Upper Extremity Assessment: Defer to OT evaluation    Lower Extremity Assessment Lower Extremity Assessment: Overall WFL for tasks assessed    Cervical / Trunk Assessment Cervical / Trunk Assessment: Normal  Communication   Communication: No difficulties  Cognition Arousal/Alertness: Awake/alert Behavior During Therapy: WFL for tasks assessed/performed Overall Cognitive Status: Within Functional Limits for tasks assessed  General Comments      Exercises     Assessment/Plan    PT Assessment Patient does not need any further PT services  PT Problem List         PT Treatment Interventions      PT Goals (Current goals can be found in the Care Plan section)  Acute Rehab PT Goals Patient Stated Goal: home soon. less pain PT Goal Formulation: All  assessment and education complete, DC therapy    Frequency     Barriers to discharge        Co-evaluation               AM-PAC PT "6 Clicks" Mobility  Outcome Measure Help needed turning from your back to your side while in a flat bed without using bedrails?: None Help needed moving from lying on your back to sitting on the side of a flat bed without using bedrails?: None Help needed moving to and from a bed to a chair (including a wheelchair)?: None Help needed standing up from a chair using your arms (e.g., wheelchair or bedside chair)?: None Help needed to walk in hospital room?: A Little Help needed climbing 3-5 steps with a railing? : A Little 6 Click Score: 22    End of Session   Activity Tolerance: Patient tolerated treatment well (but gait is painful) Patient left: in bed;with call bell/phone within reach;with family/visitor present   PT Visit Diagnosis: Pain Pain - Right/Left: Left Pain - part of body: Ankle and joints of foot    Time: 8185-9093 PT Time Calculation (min) (ACUTE ONLY): 16 min   Charges:   PT Evaluation $PT Eval Low Complexity: [place]ss[place], PT Acute Rehabilitation  Office: [phone] Pager: [phone]

## 2021-03-04 NOTE — Evaluation (Signed)
Occupational Therapy Evaluation Patient Details Name: Cheryl Delgado MRN: [id number] DOB: 06-18-1945 Today's Date: 03/04/2021   History of Present Illness Patient is a 75 year old female with history of diabetes type 2, hypertension, peripheral edema who presented with 2-week history of lower extremity erythema, swelling, edema. Patient admitted for cellulitis.   Clinical Impression   [name] is a 75 year old woman who presents with above medical history. On evaluation she demonstrates functional upper body strength, ability to perform her baseline ADLs (toileting, bathing, slipping on shoes, standing at sink) and functional mobility. Patient did not and does not use DME and had no loss of balance with in room ambulation. Patient at baseline and has no OT needs at this time.       Recommendations for follow up therapy are one component of a multi-disciplinary discharge planning process, led by the attending physician.  Recommendations may be updated based on patient status, additional functional criteria and insurance authorization.   Follow Up Recommendations  No OT follow up    Assistance Recommended at Discharge None  Functional Status Assessment  Patient has not had a recent decline in their functional status  Equipment Recommendations  None recommended by OT    Recommendations for Other Services       Precautions / Restrictions Precautions Precautions: None Restrictions Weight Bearing Restrictions: No      Mobility Bed Mobility Overal bed mobility: Independent                  Transfers Overall transfer level: Needs assistance                 General transfer comment: Patient ambulating in room without device with somewhat altered gait but reports it is her baseline. No loss of balance no reports of physical deficits other than pain in LLE and improvement of swelling. Therapist provided supervision for assessment.      Balance Overall balance  assessment: No apparent balance deficits (not formally assessed)                                         ADL either performed or assessed with clinical judgement   ADL Overall ADL's : At baseline                                             Vision Patient Visual Report: No change from baseline       Perception     Praxis      Pertinent Vitals/Pain Pain Assessment: 0-10 Pain Score: 3  Pain Location: left ankle Pain Descriptors / Indicators: Discomfort Pain Intervention(s): Monitored during session     Hand Dominance Left   Extremity/Trunk Assessment Upper Extremity Assessment Upper Extremity Assessment: Overall WFL for tasks assessed   Lower Extremity Assessment Lower Extremity Assessment: Defer to PT evaluation   Cervical / Trunk Assessment Cervical / Trunk Assessment: Normal   Communication Communication Communication: No difficulties   Cognition Arousal/Alertness: Awake/alert Behavior During Therapy: WFL for tasks assessed/performed Overall Cognitive Status: Within Functional Limits for tasks assessed  General Comments       Exercises     Shoulder Instructions      Home Living Family/patient expects to be discharged to:: Private residence Living Arrangements: Spouse/significant other Available Help at Discharge: Family;Available 24 hours/day Type of Home: House Home Access: Stairs to enter [employer] of Steps: 2 Entrance Stairs-Rails: Right;Left Home Layout: One level     Bathroom Shower/Tub: Occupational psychologist: Handicapped height     Home Equipment: Shower seat          Prior Functioning/Environment Prior Level of Function : Independent/Modified Independent             Mobility Comments: no device ADLs Comments: independent        OT Problem List: Obesity;Pain      OT Treatment/Interventions:      OT  Goals(Current goals can be found in the care plan section) Acute Rehab OT Goals OT Goal Formulation: All assessment and education complete, DC therapy  OT Frequency:     Barriers to D/C:            Co-evaluation              AM-PAC OT "6 Clicks" Daily Activity     Outcome Measure Help from another person eating meals?: None Help from another person taking care of personal grooming?: None Help from another person toileting, which includes using toliet, bedpan, or urinal?: None Help from another person bathing (including washing, rinsing, drying)?: None Help from another person to put on and taking off regular upper body clothing?: None Help from another person to put on and taking off regular lower body clothing?: None 6 Click Score: 24   End of Session Nurse Communication: Mobility status  Activity Tolerance: Patient tolerated treatment well Patient left: in chair;with call bell/phone within reach  OT Visit Diagnosis: Pain                Time: 1771-1657 OT Time Calculation (min): 27 min Charges:  OT General Charges $OT Visit: 1 Visit OT Evaluation $OT Eval Low Complexity: 1 Low OT Treatments $Self Care/Home Management : 8-22 mins  Renaye Janicki, OTR/L [place]  Office [phone] Pager: [phone]   Lenward Chancellor 03/04/2021, 11:10 AM

## 2021-03-04 NOTE — Progress Notes (Signed)
Initial Nutrition Assessment  DOCUMENTATION CODES:   Obesity unspecified  INTERVENTION:   -1 packet Juven BID, each packet provides 95 calories, 2.5 grams of protein (collagen), and 9.8 grams of carbohydrate (3 grams sugar); also contains 7 grams of L-arginine and L-glutamine, 300 mg vitamin C, 15 mg vitamin E, 1.2 mcg vitamin B-12, 9.5 mg zinc, 200 mg calcium, and 1.5 g  Calcium Beta-hydroxy-Beta-methylbutyrate to support wound healing  -MVI with minerals daily  NUTRITION DIAGNOSIS:   Increased nutrient needs related to wound healing as evidenced by estimated needs.  GOAL:   Patient will meet greater than or equal to 90% of their needs  MONITOR:   PO intake, Supplement acceptance, Labs, Delgado trends, Skin, I & O's  REASON FOR ASSESSMENT:   Consult Wound healing  ASSESSMENT:   Cheryl Delgado is a 75 y.o. female with medical history significant of DM2, HTN, peripheral edema.  Pt admitted with lt leg cellulitis.   Reviewed I/O's: +74 ml x 24 hours  Pt unavailable at time of visit. Attempted to speak with pt via call to hospital room phone, however, unable to reach. RD unable to obtain further nutrition-related history or complete nutrition-focused physical exam at this time.    Per MD notes, ABI did not reveal significant artery disease. Pt with history of venous insuffiencey.   Pt currently on a carb modified diet with good oral intake. Noted meal completions 75-100%.   Medications reviewed and include lasix.   Lab Results  Component Value Date   HGBA1C 9.7 (H) 03/02/2021   PTA DM medications are 4 mg glimepiride daily, 25mg  jardiance daily, 1000 metformin BID, 30 mg actos daily, and 45 mg actos daily.   Labs reviewed: CBGS: 135-170 (inpatient orders for glycemic control are 0-5 units insulin aspart daily at bedtime and 0-9 units insulin aspart TID with meals).    Diet Order:   Diet Order             Diet Carb Modified Fluid consistency: Thin; Room service  appropriate? Yes  Diet effective now                   EDUCATION NEEDS:   No education needs have been identified at this time  Skin:  Skin Assessment: Skin Integrity Issues: Skin Integrity Issues:: Other (Comment) Other: venous stasis ulcer to LLE  Last BM:  03/04/21  Height:   Ht Readings from Last 1 Encounters:  03/03/21 5\' 2"  (1.575 m)    Delgado:   Wt Readings from Last 1 Encounters:  03/03/21 97 kg    Ideal Body Delgado:  50 kg  BMI:  Body mass index is 39.1 kg/m.  Estimated Nutritional Needs:   Kcal:  1500-1700  Protein:  85-100 grams  Fluid:  > 1.5 L    Loistine Chance, RD, LDN, [place] Registered Dietitian II Certified Diabetes Care and Education Specialist Please refer to [facility] for RD and/or RD on-call/weekend/after hours pager

## 2021-03-04 NOTE — Progress Notes (Signed)
PROGRESS NOTE    Cheryl Delgado  QHU:[id number] DOB: December 13, 1945 DOA: 03/02/2021 PCP: Hoyt Koch, MD   Chief Complain:Leg infection  Brief Narrative:  Patient is a 75 year old female with history of diabetes type 2, hypertension, peripheral edema who presented with 2-week history of lower extremity erythema, swelling, edema.  She was taking Keflex and Bactrim as an outpatient as given by her PCP but is not helping.  She was sent by her PCP to the emergency department for IV antibiotic.  She was started on ceftriaxone and vancomycin in the emergency department and currently being managed for cellulitis of left lower extremity.  Plan for discharge tomorrow.   Assessment & Plan:   Principal Problem:   Cellulitis of left ankle Active Problems:   Diabetes mellitus type 2 with complications (HCC)   Essential hypertension   Chronic venous insufficiency   Left lower extremity cellulitis: Has a small ulcer on the posterior aspect of left ankle.  Wound care consulted.  Presented with worsening pain, erythema, edema.  She was taking Keflex and Bactrim at home which did not help.  Currently on ceftriaxone and vancomycin. ABI did not show any significant artery disease.  Negative  venous Dopplerfor DVT. Patient has history of chronic venous insufficiency and possibly bilateral lymphedema. Blood culture sent Wound care consulted She takes torsemide 20 mg daily at home for chronic bilateral lower extremity edema.  She has severe edema of left lower extremity and also right lower extremity.  We started her  on Lasix 40 mg twice daily IV. She needs to follow-up with vein clinic/lymphedema clinic as an outpatient after discharge Will continue current antibiotics today.  We will plan to discharge her to home tomorrow with oral antibiotics  History of hypertension: On losartan at home.  Continue monitoring blood pressure.  Continue current medication  Diabetes type 2: Monitor blood sugars.   Continue sliding-scale insulin.  Takes glimepiride, Jardiance, metformin, pioglitazone at home.  Hyperlipidemia: Takes Crestor  Mild hyperkalemia: Resolved with Lokelma  Debility/deconditioning: We have requested PT/OT evaluation.  Patient lives at home has ambulatory problems.  Morbid obesity: BMI 39.1.        DVT prophylaxis:Lovenox Code Status: Full Family Communication: None at bedside.  Patient saying she is talking with her family,no need to call Status is: Observation   Consultants: None  Procedures:None  Antimicrobials:  Anti-infectives (From admission, onward)    Start     Dose/Rate Route Frequency Ordered Stop   03/03/21 2000  vancomycin (VANCOREADY) IVPB 750 mg/150 mL        750 mg 150 mL/hr over 60 Minutes Intravenous Every 24 hours 03/03/21 1035     03/03/21 1000  metroNIDAZOLE (FLAGYL) tablet 500 mg  Status:  Discontinued       See Hyperspace for full Linked Orders Report.   500 mg Oral Every 12 hours 03/02/21 2113 03/03/21 1024   03/03/21 0800  cefTRIAXone (ROCEPHIN) 2 g in sodium chloride 0.9 % 100 mL IVPB       See Hyperspace for full Linked Orders Report.   2 g 200 mL/hr over 30 Minutes Intravenous Every 24 hours 03/02/21 2113 03/10/21 0759   03/02/21 1945  vancomycin (VANCOCIN) IVPB 1000 mg/200 mL premix        1,000 mg 200 mL/hr over 60 Minutes Intravenous  Once 03/02/21 1939 03/02/21 2140   03/02/21 1945  ceFEPIme (MAXIPIME) 2 g in sodium chloride 0.9 % 100 mL IVPB        2 g  200 mL/hr over 30 Minutes Intravenous  Once 03/02/21 1939 03/02/21 2020       Subjective:  Patient seen and examined at the bedside this morning.  Hemodynamically stable.  Comfortable.  Her left lower extremity cellulitis has significantly improved.  There is improving bilateral lower extremity edema   objective: Vitals:   03/03/21 1349 03/03/21 1717 03/03/21 2121 03/04/21 0113  BP: 102/60 130/61 (!) 113/56 (!) 155/72  Pulse: 84 75 74 66  Resp: 17 16 16 16   Temp:  98.5 F (36.9 C) 98.8 F (37.1 C) 98 F (36.7 C) 98.7 F (37.1 C)  TempSrc: Oral Oral Oral Oral  SpO2: 95% 97% 96% 95%  Weight: 97 kg     Height: 5\' 2"  (1.575 m)       Intake/Output Summary (Last 24 hours) at 03/04/2021 0817 Last data filed at 03/03/2021 1700 Gross per 24 hour  Intake 73.77 ml  Output --  Net 73.77 ml   Filed Weights   03/03/21 1349  Weight: 97 kg    Examination:  General exam: Overall comfortable, not in distress,obese HEENT: PERRL Respiratory system:  no wheezes or crackles  Cardiovascular system: S1 & S2 heard, RRR.  Gastrointestinal system: Abdomen is nondistended, soft and nontender. Central nervous system: Alert and oriented Extremities: Improving lower extremity edema, no clubbing ,no cyanosis  Skin:        Data Reviewed: I have personally reviewed following labs and imaging studies  CBC: Recent Labs  Lab 03/02/21 1618 03/03/21 0500 03/04/21 0549  WBC 7.6 7.3 6.4  NEUTROABS 4.9  --  2.9  HGB 12.4 11.0* 11.3*  HCT 40.1 35.7* 36.1  MCV 80.7 81.0 80.9  PLT 350 330 631   Basic Metabolic Panel: Recent Labs  Lab 03/02/21 1618 03/03/21 0500 03/04/21 0549  NA 136 135 136  K 4.5 5.2* 4.5  CL 98 100 98  CO2 29 29 30   GLUCOSE 125* 173* 157*  BUN 28* 25* 26*  CREATININE 1.12* 1.13* 1.06*  CALCIUM 9.4 8.6* 9.0   GFR: Estimated Creatinine Clearance: 50.6 mL/min (A) (by C-G formula based on SCr of 1.06 mg/dL (H)). Liver Function Tests: Recent Labs  Lab 03/02/21 1618  AST 18  ALT 14  ALKPHOS 62  BILITOT 0.3  PROT 7.8  ALBUMIN 3.2*   No results for input(s): LIPASE, AMYLASE in the last 168 hours. No results for input(s): AMMONIA in the last 168 hours. Coagulation Profile: No results for input(s): INR, PROTIME in the last 168 hours. Cardiac Enzymes: No results for input(s): CKTOTAL, CKMB, CKMBINDEX, TROPONINI in the last 168 hours. BNP (last 3 results) No results for input(s): PROBNP in the last 8760 hours. HbA1C: Recent  Labs    03/02/21 1618  HGBA1C 9.7*   CBG: Recent Labs  Lab 03/03/21 0814 03/03/21 1150 03/03/21 1657 03/03/21 2217 03/04/21 0748  GLUCAP 165* 213* 165* 148* 135*   Lipid Profile: No results for input(s): CHOL, HDL, LDLCALC, TRIG, CHOLHDL, LDLDIRECT in the last 72 hours. Thyroid Function Tests: No results for input(s): TSH, T4TOTAL, FREET4, T3FREE, THYROIDAB in the last 72 hours. Anemia Panel: No results for input(s): VITAMINB12, FOLATE, FERRITIN, TIBC, IRON, RETICCTPCT in the last 72 hours. Sepsis Labs: Recent Labs  Lab 03/02/21 1618 03/02/21 1844  LATICACIDVEN 1.5 1.8    Recent Results (from the past 240 hour(s))  Resp Panel by RT-PCR (Flu A&B, Covid) Nasopharyngeal Swab     Status: None   Collection Time: 03/02/21  6:44 PM   Specimen: Nasopharyngeal  Swab; Nasopharyngeal(NP) swabs in vial transport medium  Result Value Ref Range Status   SARS Coronavirus 2 by RT PCR NEGATIVE NEGATIVE Final    Comment: (NOTE) SARS-CoV-2 target nucleic acids are NOT DETECTED.  The SARS-CoV-2 RNA is generally detectable in upper respiratory specimens during the acute phase of infection. The lowest concentration of SARS-CoV-2 viral copies this assay can detect is 138 copies/mL. A negative result does not preclude SARS-Cov-2 infection and should not be used as the sole basis for treatment or other patient management decisions. A negative result may occur with  improper specimen collection/handling, submission of specimen other than nasopharyngeal swab, presence of viral mutation(s) within the areas targeted by this assay, and inadequate number of viral copies(<138 copies/mL). A negative result must be combined with clinical observations, patient history, and epidemiological information. The expected result is Negative.  Fact Sheet for Patients:  EntrepreneurPulse.com.au  Fact Sheet for Healthcare Providers:  IncredibleEmployment.be  This test is  no t yet approved or cleared by the [place] FDA and  has been authorized for detection and/or diagnosis of SARS-CoV-2 by FDA under an Emergency Use Authorization (EUA). This EUA will remain  in effect (meaning this test can be used) for the duration of the COVID-19 declaration under Section 564(b)(1) of the Act, 21 U.S.C.section 360bbb-3(b)(1), unless the authorization is terminated  or revoked sooner.       Influenza A by PCR NEGATIVE NEGATIVE Final   Influenza B by PCR NEGATIVE NEGATIVE Final    Comment: (NOTE) The Xpert Xpress SARS-CoV-2/FLU/RSV plus assay is intended as an aid in the diagnosis of influenza from Nasopharyngeal swab specimens and should not be used as a sole basis for treatment. Nasal washings and aspirates are unacceptable for Xpert Xpress SARS-CoV-2/FLU/RSV testing.  Fact Sheet for Patients: EntrepreneurPulse.com.au  Fact Sheet for Healthcare Providers: IncredibleEmployment.be  This test is not yet approved or cleared by the [place] FDA and has been authorized for detection and/or diagnosis of SARS-CoV-2 by FDA under an Emergency Use Authorization (EUA). This EUA will remain in effect (meaning this test can be used) for the duration of the COVID-19 declaration under Section 564(b)(1) of the Act, 21 U.S.C. section 360bbb-3(b)(1), unless the authorization is terminated or revoked.  Performed at [facility], [place] [address] [place], [place] [address]   MRSA Next Gen by PCR, Nasal     Status: None   Collection Time: 03/02/21  9:42 PM   Specimen: Nasal Mucosa; Nasal Swab  Result Value Ref Range Status   MRSA by PCR Next Gen NOT DETECTED NOT DETECTED Final    Comment: (NOTE) The GeneXpert MRSA Assay (FDA approved for NASAL specimens only), is one component of a comprehensive MRSA colonization surveillance program. It is not intended to diagnose MRSA infection nor to guide or monitor  treatment for MRSA infections. Test performance is not FDA approved in patients less than 64 years old. Performed at [facility], [place] [address] [place], [place] [address]          Radiology Studies: DG Tibia/Fibula Left  Result Date: 03/02/2021 CLINICAL DATA:  Left leg wound with redness and swelling x2 weeks. EXAM: LEFT TIBIA AND FIBULA - 2 VIEW COMPARISON:  None. FINDINGS: There is no evidence of fracture or other focal bone lesions. Diffuse soft tissue swelling is seen. IMPRESSION: 1. No acute osseous abnormality. 2. Diffuse soft tissue swelling. Electronically Signed   By: Virgina Norfolk M.D.   On: 03/02/2021 17:19   DG  Foot Complete Left  Result Date: 03/02/2021 CLINICAL DATA:  Left leg wounds with redness and swelling x2 weeks. EXAM: LEFT FOOT - COMPLETE 3+ VIEW COMPARISON:  None. FINDINGS: There is no evidence of fracture or dislocation. There is no evidence of arthropathy or other focal bone abnormality. Marked severity diffuse soft tissue swelling is noted. IMPRESSION: 1. No acute osseous abnormality. 2. Marked severity diffuse soft tissue swelling. Electronically Signed   By: Virgina Norfolk M.D.   On: 03/02/2021 17:20   VAS [place] ABI WITH/WO TBI  Result Date: 03/03/2021  LOWER EXTREMITY DOPPLER STUDY Patient Name:  BERNADINE MELECIO  Date of Exam:   03/03/2021 Medical Rec #: [id number]       Accession #:    [id number] Date of Birth: 09-Sep-1945        Patient Gender: F Patient Age:   16 years Exam Location:  [facility] Procedure:      VAS [place] ABI WITH/WO TBI Referring Phys: Jennette Kettle --------------------------------------------------------------------------------  Indications: Ulceration. High Risk Factors: Hypertension, Diabetes. Other Factors: Venous insufficiency.  Limitations: Today's exam was limited due to patient intolerant to cuff pressure              and an open wound. Comparison Study: No prior studies. Performing Technologist: Carlos Levering RVT  Examination Guidelines: A complete evaluation includes at minimum, Doppler waveform signals and systolic blood pressure reading at the level of bilateral brachial, anterior tibial, and posterior tibial arteries, when vessel segments are accessible. Bilateral testing is considered an integral part of a complete examination. Photoelectric Plethysmograph (PPG) waveforms and toe systolic pressure readings are included as required and additional duplex testing as needed. Limited examinations for reoccurring indications may be performed as noted.  ABI Findings: +---------+------------------+-----+---------+--------+ Right    Rt Pressure (mmHg)IndexWaveform Comment  +---------+------------------+-----+---------+--------+ Brachial 136                    triphasic         +---------+------------------+-----+---------+--------+ PTA      135               0.99 triphasic         +---------+------------------+-----+---------+--------+ DP       143               1.05 triphasic         +---------+------------------+-----+---------+--------+ Great Toe95                0.70                   +---------+------------------+-----+---------+--------+ +---------+------------------+-----+---------+-------+ Left     Lt Pressure (mmHg)IndexWaveform Comment +---------+------------------+-----+---------+-------+ Brachial 126                    triphasic        +---------+------------------+-----+---------+-------+ PTA      118               0.87 biphasic         +---------+------------------+-----+---------+-------+ DP       138               1.01 biphasic         +---------+------------------+-----+---------+-------+ Great Toe83                0.61                  +---------+------------------+-----+---------+-------+ +-------+-----------+-----------+------------+------------+ ABI/TBIToday's ABIToday's TBIPrevious ABIPrevious TBI  +-------+-----------+-----------+------------+------------+ Right  1.05  0.7                                 +-------+-----------+-----------+------------+------------+ Left   1.01       0.61                                +-------+-----------+-----------+------------+------------+  Summary: Right: Resting right ankle-brachial index is within normal range. No evidence of significant right lower extremity arterial disease. The right toe-brachial index is normal. Left: Resting left ankle-brachial index is within normal range. No evidence of significant left lower extremity arterial disease. The left toe-brachial index is abnormal.  *See table(s) above for measurements and observations.  Electronically signed by Jamelle Haring on 03/03/2021 at 11:06:43 AM.    Final    VAS [place] LOWER EXTREMITY VENOUS (DVT)  Result Date: 03/03/2021  Lower Venous DVT Study Patient Name:  RAELYNNE LUDWICK  Date of Exam:   03/03/2021 Medical Rec #: [id number]       Accession #:    [id number] Date of Birth: 09/01/1945        Patient Gender: F Patient Age:   41 years Exam Location:  [facility] Procedure:      VAS [place] LOWER EXTREMITY VENOUS (DVT) Referring Phys: Legna Mausolf --------------------------------------------------------------------------------  Indications: Edema.  Risk Factors: None identified. Limitations: Body habitus and poor ultrasound/tissue interface. Comparison Study: No prior studies. Performing Technologist: Oliver Hum RVT  Examination Guidelines: A complete evaluation includes B-mode imaging, spectral Doppler, color Doppler, and power Doppler as needed of all accessible portions of each vessel. Bilateral testing is considered an integral part of a complete examination. Limited examinations for reoccurring indications may be performed as noted. The reflux portion of the exam is performed with the patient in reverse Trendelenburg.   +-----+---------------+---------+-----------+----------+--------------+ RIGHTCompressibilityPhasicitySpontaneityPropertiesThrombus Aging +-----+---------------+---------+-----------+----------+--------------+ CFV  Full           Yes      Yes                                 +-----+---------------+---------+-----------+----------+--------------+   +---------+---------------+---------+-----------+----------+--------------+ LEFT     CompressibilityPhasicitySpontaneityPropertiesThrombus Aging +---------+---------------+---------+-----------+----------+--------------+ CFV      Full           Yes      Yes                                 +---------+---------------+---------+-----------+----------+--------------+ SFJ      Full                                                        +---------+---------------+---------+-----------+----------+--------------+ FV Prox  Full                                                        +---------+---------------+---------+-----------+----------+--------------+ FV Mid   Full                                                        +---------+---------------+---------+-----------+----------+--------------+  FV Distal               Yes      Yes                                 +---------+---------------+---------+-----------+----------+--------------+ PFV      Full                                                        +---------+---------------+---------+-----------+----------+--------------+ POP      Full           Yes      Yes                                 +---------+---------------+---------+-----------+----------+--------------+ PTV      Full                                                        +---------+---------------+---------+-----------+----------+--------------+ PERO     Full                                                         +---------+---------------+---------+-----------+----------+--------------+    Summary: RIGHT: - No evidence of common femoral vein obstruction.  LEFT: - There is no evidence of deep vein thrombosis in the lower extremity. However, portions of this examination were limited- see technologist comments above.  - No cystic structure found in the popliteal fossa.  *See table(s) above for measurements and observations.    Preliminary         Scheduled Meds:  enoxaparin (LOVENOX) injection  40 mg Subcutaneous Q24H   furosemide  40 mg Intravenous Q12H   insulin aspart  0-5 Units Subcutaneous QHS   insulin aspart  0-9 Units Subcutaneous TID WC   losartan  50 mg Oral Daily   rosuvastatin  10 mg Oral Daily   Continuous Infusions:  cefTRIAXone (ROCEPHIN)  IV 2 g (03/04/21 0806)   vancomycin 150 mL/hr at 03/03/21 2238     LOS: 1 day    Time spent: 25 mins.More than 50% of that time was spent in counseling and/or coordination of care.      Shelly Coss, MD [facility] P11/20/2022, 8:17 AM

## 2021-03-04 NOTE — Consult Note (Signed)
WOC Nurse Consult Note: Reason for Consult: Healed wound on LLE Wound type: venous insufficiency. Heels and sacrum intact. I was assisted in the development of the integumentary POC by Bedside RN, Candie Echevaria, RN. Pressure Injury POA: N/A Measurement:4 x 6cm no depth, area is healed and with thickened epidermis Wound bed:N/A Drainage (amount, consistency, odor) none Periwound: peeling and mild erythema Dressing procedure/placement/frequency: A prophylactic foam dressing will be placed to the sacrum and bilateral Prevalon pressure redistribution heel boots will be provided for the heels.  Topical care to the previously healed LLE wound will be with a xeroform dressing to cover topped with a silicone foam for protection.  [employer] nursing team will not follow, but will remain available to this patient, the nursing and medical teams.  Please re-consult if needed. Thanks, Maudie Flakes, MSN, RN, [place], Arther Abbott  Pager# [phone]

## 2021-03-05 DIAGNOSIS — L03116 Cellulitis of left lower limb: Secondary | ICD-10-CM | POA: Diagnosis not present

## 2021-03-05 LAB — BASIC METABOLIC PANEL
Anion gap: 9 (ref 5–15)
BUN: 31 mg/dL — ABNORMAL HIGH (ref 8–23)
CO2: 29 mmol/L (ref 22–32)
Calcium: 9 mg/dL (ref 8.9–10.3)
Chloride: 97 mmol/L — ABNORMAL LOW (ref 98–111)
Creatinine, Ser: 0.85 mg/dL (ref 0.44–1.00)
GFR, Estimated: >60 mL/min (ref >60)
Glucose, Bld: 186 mg/dL — ABNORMAL HIGH (ref 70–99)
Potassium: 3.7 mmol/L (ref 3.5–5.1)
Sodium: 135 mmol/L (ref 135–145)

## 2021-03-05 LAB — GLUCOSE, CAPILLARY
Glucose-Capillary: 177 mg/dL — ABNORMAL HIGH (ref 70–99)
Glucose-Capillary: 204 mg/dL — ABNORMAL HIGH (ref 70–99)

## 2021-03-05 MED ORDER — AMOXICILLIN-POT CLAVULANATE 875-125 MG PO TABS: 1.0000 | ORAL_TABLET | Freq: Two times a day (BID) | ORAL | 0 refills | Status: AC

## 2021-03-05 NOTE — Progress Notes (Signed)
Cheryl Delgado to be D/C'd Home with home health per MD order.  Discussed with the patient and all questions fully answered.  VSS, Skin clean, dry and intact without evidence of skin break down, no evidence of skin tears noted. IV catheter discontinued intact. Site without signs and symptoms of complications. Dressing and pressure applied.  An After Visit Summary was printed and given to the patient. Patient prescription sent to pharmacy.  D/c education completed with patient/family including follow up instructions, medication list, d/c activities limitations if indicated, with other d/c instructions as indicated by MD - patient able to verbalize understanding, all questions fully answered.   Patient instructed to return to ED, call 911, or call MD for any changes in condition.   Patient escorted via [place], and D/C home via private auto.  Manuella Ghazi 03/05/2021 1:59 PM

## 2021-03-05 NOTE — TOC Transition Note (Addendum)
Transition of Care [facility]) - CM/SW Discharge Note   Patient Details  Name: KILEEN LANGE MRN: [id number] Date of Birth: 08/15/1945  [facility] of [facility]) CM/SW Contact:  Ross Ludwig, LCSW Phone Number: 03/05/2021, 12:40 PM   Clinical Narrative:     CSW was informed that patient will need to follow up with lymphedema clinic.  CSW included contact information for the clinic on the AVS.  CSW was also informed that patient will need home health RN.  CSW was able to make arrangements for [facility] to provide [facility] RN for patient.  Patient does not have any other needs, CSW signing off.   Final next level of care: [place] Barriers to Discharge: Barriers Resolved   Patient Goals and CMS Choice Patient states their goals for this hospitalization and ongoing recovery are:: To return back home with home health RN.      Discharge Placement                       Discharge Plan and Services                          HH Arranged: RN [facility] Agency: [place] Date [facility] Agency Contacted: 03/05/21 Time [place]: 56 Representative spoke with at [place]: [place] ([place]) Interventions     Readmission Risk Interventions No flowsheet data found.

## 2021-03-05 NOTE — Discharge Summary (Signed)
Physician Discharge Summary  Cheryl Delgado QZR:[id number] DOB: 23-Apr-1945 DOA: 03/02/2021  PCP: Hoyt Koch, MD  Admit date: 03/02/2021 Discharge date: 03/05/2021  Admitted From: Home Disposition:  Home  Discharge Condition:Stable CODE STATUS:FULL Diet recommendation:  Carb Modified   Brief/Interim Summary:  Patient is a 75 year old female with history of diabetes type 2, hypertension, peripheral edema who presented with 2-week history of lower extremity erythema, swelling, edema.  She was taking Keflex and Bactrim as an outpatient as given by her PCP but is not helping.  She was sent by her PCP to the emergency department for IV antibiotic.  She was started on ceftriaxone and vancomycin in the emergency department and was being managed for cellulitis of left lower extremity.  Left lower extremity cellulitis looks much better.  Bilateral lower extremity edema significantly improved.  She is medically stable for discharge to home with oral antibiotics.  Following problems were addressed during her hospitalization:    Left lower extremity cellulitis: Has a small ulcer on the posterior aspect of left ankle.  Wound care consulted.  Presented with worsening pain, erythema, edema.  She was taking Keflex and Bactrim at home which did not help.  Currently on ceftriaxone and vancomycin. ABI did not show any significant artery disease.  Negative  venous Dopplerfor DVT. Patient has history of chronic venous insufficiency and possibly bilateral lymphedema. Blood culture sent,NGTD Wound care consulted She takes torsemide 20 mg daily at home for chronic bilateral lower extremity edema.  She had severe edema of left lower extremity and also right lower extremity.  We started her  on Lasix 40 mg twice daily IV. She needs to follow-up with vein clinic/lymphedema clinic as an outpatient after discharge Leg edema, left leg cellulitis looks much better.  Antibiotics changed to oral.  Medically  stable for discharge. Requested for wound care RN at home.  Requested social worker assistance on lymphedema clinic follow-up   History of hypertension: On losartan at home.     Diabetes type 2: Monitor blood sugars.    Takes glimepiride, Jardiance, metformin, pioglitazone at home.   Hyperlipidemia: Takes Crestor   Mild hyperkalemia: Resolved with Lokelma   Debility/deconditioning: PT/OT did not recommend follow-up.  Morbid obesity: BMI 39.1.    Discharge Diagnoses:  Principal Problem:   Cellulitis of left ankle Active Problems:   Diabetes mellitus type 2 with complications [facility])   Essential hypertension   Chronic venous insufficiency    Discharge Instructions  Discharge Instructions     Diet Carb Modified   Complete by: As directed    Discharge instructions   Complete by: As directed    1)Please take prescribed medications as instructed 2)Follow up with your PCP in a week. 3)Follow up with outpatient lymphedema clinic   Discharge wound care:   Complete by: As directed    As per wound care nurse   Increase activity slowly   Complete by: As directed       Allergies as of 03/05/2021   No Known Allergies      Medication List     TAKE these medications    acetaminophen 500 MG tablet Commonly known as: TYLENOL Take 1,000 mg by mouth every 6 (six) hours as needed for moderate pain.   amoxicillin-clavulanate 875-125 MG tablet Commonly known as: Augmentin Take 1 tablet by mouth 2 (two) times daily for 5 days.   glimepiride 4 MG tablet Commonly known as: AMARYL TAKE 1 TABLET(4 MG) BY MOUTH DAILY BEFORE AND BREAKFAST What changed:  See the new instructions.   Jardiance 25 MG Tabs tablet Generic drug: empagliflozin TAKE 1 TABLET BY MOUTH DAILY What changed:  how much to take how to take this   ketoconazole 2 % cream Commonly known as: NIZORAL Apply 1 application topically daily. Rub in well especially between toes What changed:  when to take  this reasons to take this   losartan 50 MG tablet Commonly known as: COZAAR TAKE 1 TABLET(50 MG) BY MOUTH DAILY What changed: See the new instructions.   metFORMIN 1000 MG tablet Commonly known as: GLUCOPHAGE TAKE 1 TABLET(1000 MG) BY MOUTH TWICE DAILY WITH A MEAL What changed: See the new instructions.   ondansetron 4 MG tablet Commonly known as: Zofran Take 1 tablet (4 mg total) by mouth every 8 (eight) hours as needed for nausea or vomiting.   pioglitazone 45 MG tablet Commonly known as: Actos Take 1 tablet (45 mg total) by mouth daily. What changed: Another medication with the same name was removed. Continue taking this medication, and follow the directions you see here.   rosuvastatin 10 MG tablet Commonly known as: CRESTOR TAKE 1 TABLET(10 MG) BY MOUTH DAILY What changed: See the new instructions.   sennosides-docusate sodium 8.6-50 MG tablet Commonly known as: SENOKOT-S Take 1 tablet by mouth 2 (two) times daily as needed for constipation.   torsemide 20 MG tablet Commonly known as: DEMADEX TAKE 1 TABLET(20 MG) BY MOUTH DAILY What changed: See the new instructions.   traMADol 50 MG tablet Commonly known as: ULTRAM Take 50 mg by mouth every 6 (six) hours as needed for severe pain.               Discharge Care Instructions  (From admission, onward)           Start     Ordered   03/05/21 0000  Discharge wound care:       Comments: As per wound care nurse   03/05/21 1044            Follow-up Information     Hoyt Koch, MD. Schedule an appointment as soon as possible for a visit in 1 week(s).   Specialty: Internal Medicine Contact information: [place] [address] [phone]                No Known Allergies  Consultations: None   Procedures/Studies: DG Tibia/Fibula Left  Result Date: 03/02/2021 CLINICAL DATA:  Left leg wound with redness and swelling x2 weeks. EXAM: LEFT TIBIA AND FIBULA -  2 VIEW COMPARISON:  None. FINDINGS: There is no evidence of fracture or other focal bone lesions. Diffuse soft tissue swelling is seen. IMPRESSION: 1. No acute osseous abnormality. 2. Diffuse soft tissue swelling. Electronically Signed   By: Virgina Norfolk M.D.   On: 03/02/2021 17:19   DG Foot Complete Left  Result Date: 03/02/2021 CLINICAL DATA:  Left leg wounds with redness and swelling x2 weeks. EXAM: LEFT FOOT - COMPLETE 3+ VIEW COMPARISON:  None. FINDINGS: There is no evidence of fracture or dislocation. There is no evidence of arthropathy or other focal bone abnormality. Marked severity diffuse soft tissue swelling is noted. IMPRESSION: 1. No acute osseous abnormality. 2. Marked severity diffuse soft tissue swelling. Electronically Signed   By: Virgina Norfolk M.D.   On: 03/02/2021 17:20   VAS [place] ABI WITH/WO TBI  Result Date: 03/03/2021  LOWER EXTREMITY DOPPLER STUDY Patient Name:  Cheryl Delgado  Date of Exam:   03/03/2021  Medical Rec #: [id number]       Accession #:    [id number] Date of Birth: [birth year]        Patient Gender: F Patient Age:   41 years Exam Location:  [facility] Procedure:      VAS [place] ABI WITH/WO TBI Referring Phys: JARED GARDNER --------------------------------------------------------------------------------  Indications: Ulceration. High Risk Factors: Hypertension, Diabetes. Other Factors: Venous insufficiency.  Limitations: Today's exam was limited due to patient intolerant to cuff pressure              and an open wound. Comparison Study: No prior studies. Performing Technologist: Carlos Levering RVT  Examination Guidelines: A complete evaluation includes at minimum, Doppler waveform signals and systolic blood pressure reading at the level of bilateral brachial, anterior tibial, and posterior tibial arteries, when vessel segments are accessible. Bilateral testing is considered an integral part of a complete examination. Photoelectric Plethysmograph (PPG) waveforms  and toe systolic pressure readings are included as required and additional duplex testing as needed. Limited examinations for reoccurring indications may be performed as noted.  ABI Findings: +---------+------------------+-----+---------+--------+ Right    Rt Pressure (mmHg)IndexWaveform Comment  +---------+------------------+-----+---------+--------+ Brachial 136                    triphasic         +---------+------------------+-----+---------+--------+ PTA      135               0.99 triphasic         +---------+------------------+-----+---------+--------+ DP       143               1.05 triphasic         +---------+------------------+-----+---------+--------+ Great Toe95                0.70                   +---------+------------------+-----+---------+--------+ +---------+------------------+-----+---------+-------+ Left     Lt Pressure (mmHg)IndexWaveform Comment +---------+------------------+-----+---------+-------+ Brachial 126                    triphasic        +---------+------------------+-----+---------+-------+ PTA      118               0.87 biphasic         +---------+------------------+-----+---------+-------+ DP       138               1.01 biphasic         +---------+------------------+-----+---------+-------+ Great Toe83                0.61                  +---------+------------------+-----+---------+-------+ +-------+-----------+-----------+------------+------------+ ABI/TBIToday's ABIToday's TBIPrevious ABIPrevious TBI +-------+-----------+-----------+------------+------------+ Right  1.05       0.7                                 +-------+-----------+-----------+------------+------------+ Left   1.01       0.61                                +-------+-----------+-----------+------------+------------+  Summary: Right: Resting right ankle-brachial index is within normal range. No evidence of significant right lower  extremity arterial disease. The right toe-brachial index is normal. Left: Resting left ankle-brachial index is  within normal range. No evidence of significant left lower extremity arterial disease. The left toe-brachial index is abnormal.  *See table(s) above for measurements and observations.  Electronically signed by Jamelle Haring on 03/03/2021 at 11:06:43 AM.    Final    VAS [place] LOWER EXTREMITY VENOUS (DVT)  Result Date: 03/04/2021  Lower Venous DVT Study Patient Name:  Cheryl Delgado  Date of Exam:   03/03/2021 Medical Rec #: [id number]       Accession #:    [id number] Date of Birth: 1946-04-12        Patient Gender: F Patient Age:   35 years Exam Location:  [facility] Procedure:      VAS [place] LOWER EXTREMITY VENOUS (DVT) Referring Phys: Hasini Peachey --------------------------------------------------------------------------------  Indications: Edema.  Risk Factors: None identified. Limitations: Body habitus and poor ultrasound/tissue interface. Comparison Study: No prior studies. Performing Technologist: Oliver Hum RVT  Examination Guidelines: A complete evaluation includes B-mode imaging, spectral Doppler, color Doppler, and power Doppler as needed of all accessible portions of each vessel. Bilateral testing is considered an integral part of a complete examination. Limited examinations for reoccurring indications may be performed as noted. The reflux portion of the exam is performed with the patient in reverse Trendelenburg.  +-----+---------------+---------+-----------+----------+--------------+ RIGHTCompressibilityPhasicitySpontaneityPropertiesThrombus Aging +-----+---------------+---------+-----------+----------+--------------+ CFV  Full           Yes      Yes                                 +-----+---------------+---------+-----------+----------+--------------+   +---------+---------------+---------+-----------+----------+--------------+ LEFT      CompressibilityPhasicitySpontaneityPropertiesThrombus Aging +---------+---------------+---------+-----------+----------+--------------+ CFV      Full           Yes      Yes                                 +---------+---------------+---------+-----------+----------+--------------+ SFJ      Full                                                        +---------+---------------+---------+-----------+----------+--------------+ FV Prox  Full                                                        +---------+---------------+---------+-----------+----------+--------------+ FV Mid   Full                                                        +---------+---------------+---------+-----------+----------+--------------+ FV Distal               Yes      Yes                                 +---------+---------------+---------+-----------+----------+--------------+ PFV      Full                                                        +---------+---------------+---------+-----------+----------+--------------+  POP      Full           Yes      Yes                                 +---------+---------------+---------+-----------+----------+--------------+ PTV      Full                                                        +---------+---------------+---------+-----------+----------+--------------+ PERO     Full                                                        +---------+---------------+---------+-----------+----------+--------------+    Summary: RIGHT: - No evidence of common femoral vein obstruction.  LEFT: - There is no evidence of deep vein thrombosis in the lower extremity. However, portions of this examination were limited- see technologist comments above.  - No cystic structure found in the popliteal fossa.  *See table(s) above for measurements and observations. Electronically signed by Jamelle Haring on 03/04/2021 at 11:58:02 AM.    Final        Subjective:  Patient seen and examined at the bedside this morning.  Hemodynamically stable for discharge. Discharge Exam: Vitals:   03/04/21 2129 03/05/21 0605  BP: 129/65 114/71  Pulse: 87 82  Resp: 17 17  Temp: 98.9 F (37.2 C) 98.4 F (36.9 C)  SpO2: 95% 99%   Vitals:   03/04/21 1031 03/04/21 1403 03/04/21 2129 03/05/21 0605  BP: (!) 108/52 (!) 113/50 129/65 114/71  Pulse: 80 95 87 82  Resp: 16 16 17 17   Temp: 97.8 F (36.6 C) 97.7 F (36.5 C) 98.9 F (37.2 C) 98.4 F (36.9 C)  TempSrc: Oral Oral Oral Oral  SpO2: 98% 97% 95% 99%  Weight:      Height:        General: Pt is alert, awake, not in acute distress, obese Cardiovascular: RRR, S1/S2 +, no rubs, no gallops Respiratory: CTA bilaterally, no wheezing, no rhonchi Abdominal: Soft, NT, ND, bowel sounds + Extremities: no edema, no cyanosis, improving cellulitis of left lower extremity    The results of significant diagnostics from this hospitalization (including imaging, microbiology, ancillary and laboratory) are listed below for reference.     Microbiology: Recent Results (from the past 240 hour(s))  Resp Panel by RT-PCR (Flu A&B, Covid) Nasopharyngeal Swab     Status: None   Collection Time: 03/02/21  6:44 PM   Specimen: Nasopharyngeal Swab; Nasopharyngeal(NP) swabs in vial transport medium  Result Value Ref Range Status   SARS Coronavirus 2 by RT PCR NEGATIVE NEGATIVE Final    Comment: (NOTE) SARS-CoV-2 target nucleic acids are NOT DETECTED.  The SARS-CoV-2 RNA is generally detectable in upper respiratory specimens during the acute phase of infection. The lowest concentration of SARS-CoV-2 viral copies this assay can detect is 138 copies/mL. A negative result does not preclude SARS-Cov-2 infection and should not be used as the sole basis for treatment or other patient management decisions. A negative result may occur with  improper specimen collection/handling, submission of specimen  other  than nasopharyngeal swab, presence of viral mutation(s) within the areas targeted by this assay, and inadequate number of viral copies(<138 copies/mL). A negative result must be combined with clinical observations, patient history, and epidemiological information. The expected result is Negative.  Fact Sheet for Patients:  EntrepreneurPulse.com.au  Fact Sheet for Healthcare Providers:  IncredibleEmployment.be  This test is no t yet approved or cleared by the [place] FDA and  has been authorized for detection and/or diagnosis of SARS-CoV-2 by FDA under an Emergency Use Authorization (EUA). This EUA will remain  in effect (meaning this test can be used) for the duration of the COVID-19 declaration under Section 564(b)(1) of the Act, 21 U.S.C.section 360bbb-3(b)(1), unless the authorization is terminated  or revoked sooner.       Influenza A by PCR NEGATIVE NEGATIVE Final   Influenza B by PCR NEGATIVE NEGATIVE Final    Comment: (NOTE) The Xpert Xpress SARS-CoV-2/FLU/RSV plus assay is intended as an aid in the diagnosis of influenza from Nasopharyngeal swab specimens and should not be used as a sole basis for treatment. Nasal washings and aspirates are unacceptable for Xpert Xpress SARS-CoV-2/FLU/RSV testing.  Fact Sheet for Patients: EntrepreneurPulse.com.au  Fact Sheet for Healthcare Providers: IncredibleEmployment.be  This test is not yet approved or cleared by the [place] FDA and has been authorized for detection and/or diagnosis of SARS-CoV-2 by FDA under an Emergency Use Authorization (EUA). This EUA will remain in effect (meaning this test can be used) for the duration of the COVID-19 declaration under Section 564(b)(1) of the Act, 21 U.S.C. section 360bbb-3(b)(1), unless the authorization is terminated or revoked.  Performed at [facility], [place] [address] [place], [place] [address]   MRSA Next Gen by PCR, Nasal     Status: None   Collection Time: 03/02/21  9:42 PM   Specimen: Nasal Mucosa; Nasal Swab  Result Value Ref Range Status   MRSA by PCR Next Gen NOT DETECTED NOT DETECTED Final    Comment: (NOTE) The GeneXpert MRSA Assay (FDA approved for NASAL specimens only), is one component of a comprehensive MRSA colonization surveillance program. It is not intended to diagnose MRSA infection nor to guide or monitor treatment for MRSA infections. Test performance is not FDA approved in patients less than 79 years old. Performed at [facility], [place] [address] [place], [place] [address]   Culture, blood (routine x 2)     Status: None (Preliminary result)   Collection Time: 03/03/21  2:38 PM   Specimen: BLOOD  Result Value Ref Range Status   Specimen Description   Final    BLOOD LEFT ANTECUBITAL Performed at [facility] [address] [place] [address], [place], [place] [address]    Special Requests   Final    BOTTLES DRAWN AEROBIC ONLY Blood Culture adequate volume Performed at [facility] [address], [place], [place] [address]    Culture   Final    NO GROWTH < 24 HOURS Performed at [facility] [address], [place], [place] [address]    Report Status PENDING  Incomplete  Culture, blood (routine x 2)     Status: None (Preliminary result)   Collection Time: 03/03/21  2:38 PM   Specimen: BLOOD  Result Value Ref Range Status   Specimen Description   Final    BLOOD BLOOD RIGHT HAND Performed at [facility] [place] [address], [place], [place] [address]    Special Requests  Final    BOTTLES DRAWN AEROBIC ONLY Blood Culture adequate volume Performed at [facility] [address], [place], [place] [address]    Culture   Final    NO GROWTH < 24 HOURS Performed at [facility] [address], [place], [place] [address]    Report  Status PENDING  Incomplete     Labs: BNP (last 3 results) No results for input(s): BNP in the last 8760 hours. Basic Metabolic Panel: Recent Labs  Lab 03/02/21 1618 03/03/21 0500 03/04/21 0549 03/05/21 0516  NA 136 135 136 135  K 4.5 5.2* 4.5 3.7  CL 98 100 98 97*  CO2 29 29 30 29   GLUCOSE 125* 173* 157* 186*  BUN 28* 25* 26* 31*  CREATININE 1.12* 1.13* 1.06* 0.85  CALCIUM 9.4 8.6* 9.0 9.0   Liver Function Tests: Recent Labs  Lab 03/02/21 1618  AST 18  ALT 14  ALKPHOS 62  BILITOT 0.3  PROT 7.8  ALBUMIN 3.2*   No results for input(s): LIPASE, AMYLASE in the last 168 hours. No results for input(s): AMMONIA in the last 168 hours. CBC: Recent Labs  Lab 03/02/21 1618 03/03/21 0500 03/04/21 0549  WBC 7.6 7.3 6.4  NEUTROABS 4.9  --  2.9  HGB 12.4 11.0* 11.3*  HCT 40.1 35.7* 36.1  MCV 80.7 81.0 80.9  PLT 350 330 326   Cardiac Enzymes: No results for input(s): CKTOTAL, CKMB, CKMBINDEX, TROPONINI in the last 168 hours. BNP: Invalid input(s): POCBNP CBG: Recent Labs  Lab 03/04/21 0748 03/04/21 1141 03/04/21 1734 03/04/21 2131 03/05/21 0742  GLUCAP 135* 170* 146* 217* 177*   D-Dimer No results for input(s): DDIMER in the last 72 hours. Hgb A1c Recent Labs    03/02/21 1618  HGBA1C 9.7*   Lipid Profile No results for input(s): CHOL, HDL, LDLCALC, TRIG, CHOLHDL, LDLDIRECT in the last 72 hours. Thyroid function studies No results for input(s): TSH, T4TOTAL, T3FREE, THYROIDAB in the last 72 hours.  Invalid input(s): FREET3 Anemia work up No results for input(s): VITAMINB12, FOLATE, FERRITIN, TIBC, IRON, RETICCTPCT in the last 72 hours. Urinalysis    Component Value Date/Time   COLORURINE YELLOW 01/26/2014 [place] 01/26/2014 1204   LABSPEC 1.015 01/26/2014 1204   PHURINE 6.5 01/26/2014 1204   GLUCOSEU NEGATIVE 01/26/2014 1204   HGBUR NEGATIVE 01/26/2014 [place] 01/26/2014 [place] 01/26/2014  1204   UROBILINOGEN 1.0 01/26/2014 1204   NITRITE NEGATIVE 01/26/2014 1204   LEUKOCYTESUR NEGATIVE 01/26/2014 1204   Sepsis Labs Invalid input(s): PROCALCITONIN,  WBC,  LACTICIDVEN Microbiology Recent Results (from the past 240 hour(s))  Resp Panel by RT-PCR (Flu A&B, Covid) Nasopharyngeal Swab     Status: None   Collection Time: 03/02/21  6:44 PM   Specimen: Nasopharyngeal Swab; Nasopharyngeal(NP) swabs in vial transport medium  Result Value Ref Range Status   SARS Coronavirus 2 by RT PCR NEGATIVE NEGATIVE Final    Comment: (NOTE) SARS-CoV-2 target nucleic acids are NOT DETECTED.  The SARS-CoV-2 RNA is generally detectable in upper respiratory specimens during the acute phase of infection. The lowest concentration of SARS-CoV-2 viral copies this assay can detect is 138 copies/mL. A negative result does not preclude SARS-Cov-2 infection and should not be used as the sole basis for treatment or other patient management decisions. A negative result may occur with  improper specimen collection/handling, submission of specimen other than nasopharyngeal swab, presence of viral mutation(s) within the areas  targeted by this assay, and inadequate number of viral copies(<138 copies/mL). A negative result must be combined with clinical observations, patient history, and epidemiological information. The expected result is Negative.  Fact Sheet for Patients:  EntrepreneurPulse.com.au  Fact Sheet for Healthcare Providers:  IncredibleEmployment.be  This test is no t yet approved or cleared by the [place] FDA and  has been authorized for detection and/or diagnosis of SARS-CoV-2 by FDA under an Emergency Use Authorization (EUA). This EUA will remain  in effect (meaning this test can be used) for the duration of the COVID-19 declaration under Section 564(b)(1) of the Act, 21 U.S.C.section 360bbb-3(b)(1), unless the authorization is terminated  or  revoked sooner.       Influenza A by PCR NEGATIVE NEGATIVE Final   Influenza B by PCR NEGATIVE NEGATIVE Final    Comment: (NOTE) The Xpert Xpress SARS-CoV-2/FLU/RSV plus assay is intended as an aid in the diagnosis of influenza from Nasopharyngeal swab specimens and should not be used as a sole basis for treatment. Nasal washings and aspirates are unacceptable for Xpert Xpress SARS-CoV-2/FLU/RSV testing.  Fact Sheet for Patients: EntrepreneurPulse.com.au  Fact Sheet for Healthcare Providers: IncredibleEmployment.be  This test is not yet approved or cleared by the [place] FDA and has been authorized for detection and/or diagnosis of SARS-CoV-2 by FDA under an Emergency Use Authorization (EUA). This EUA will remain in effect (meaning this test can be used) for the duration of the COVID-19 declaration under Section 564(b)(1) of the Act, 21 U.S.C. section 360bbb-3(b)(1), unless the authorization is terminated or revoked.  Performed at [facility], [place] [address] [place], [place] [address]   MRSA Next Gen by PCR, Nasal     Status: None   Collection Time: 03/02/21  9:42 PM   Specimen: Nasal Mucosa; Nasal Swab  Result Value Ref Range Status   MRSA by PCR Next Gen NOT DETECTED NOT DETECTED Final    Comment: (NOTE) The GeneXpert MRSA Assay (FDA approved for NASAL specimens only), is one component of a comprehensive MRSA colonization surveillance program. It is not intended to diagnose MRSA infection nor to guide or monitor treatment for MRSA infections. Test performance is not FDA approved in patients less than 57 years old. Performed at [facility], [place] [address], [place], [place] [address]   Culture, blood (routine x 2)     Status: None (Preliminary result)   Collection Time: 03/03/21  2:38 PM   Specimen: BLOOD  Result Value Ref Range Status   Specimen Description   Final    BLOOD LEFT  ANTECUBITAL Performed at [facility] [address], [place], [place] [address]    Special Requests   Final    BOTTLES DRAWN AEROBIC ONLY Blood Culture adequate volume Performed at [facility] [address], [place], [place] [address]    Culture   Final    NO GROWTH < 24 HOURS Performed at [facility] [address] [place], [place] [address]    Report Status PENDING  Incomplete  Culture, blood (routine x 2)     Status: None (Preliminary result)   Collection Time: 03/03/21  2:38 PM   Specimen: BLOOD  Result Value Ref Range Status   Specimen Description   Final    BLOOD BLOOD RIGHT HAND Performed at [facility] [address] [place], [place] [address]    Special Requests   Final    BOTTLES DRAWN AEROBIC ONLY Blood  Culture adequate volume Performed at [facility] [address], [place], [place] [address]    Culture   Final    NO GROWTH < 24 HOURS Performed at [facility] [address], [place], [place] [address]    Report Status PENDING  Incomplete    Please note: You were cared for by a hospitalist during your hospital stay. Once you are discharged, your primary care physician will handle any further medical issues. Please note that NO REFILLS for any discharge medications will be authorized once you are discharged, as it is imperative that you return to your primary care physician (or establish a relationship with a primary care physician if you do not have one) for your post hospital discharge needs so that they can reassess your need for medications and monitor your lab values.    Time coordinating discharge: 40 minutes  SIGNED:   Shelly Coss, MD  [facility] 03/05/2021, 10:45 AM Pager [phone]  If 7PM-7AM, please contact night-coverage [email or web address] Password [email or web address]

## 2021-03-07 ENCOUNTER — Ambulatory Visit: Payer: Medicare HMO | Admitting: Internal Medicine

## 2021-03-08 LAB — CULTURE, BLOOD (ROUTINE X 2)
Culture: NO GROWTH
Culture: NO GROWTH
Special Requests: ADEQUATE
Special Requests: ADEQUATE

## 2021-03-12 ENCOUNTER — Encounter: Payer: Self-pay | Admitting: Internal Medicine

## 2021-03-12 ENCOUNTER — Ambulatory Visit (INDEPENDENT_AMBULATORY_CARE_PROVIDER_SITE_OTHER): Payer: Medicare HMO | Admitting: Internal Medicine

## 2021-03-12 ENCOUNTER — Other Ambulatory Visit: Payer: Self-pay

## 2021-03-12 VITALS — BP 130/80 | HR 85 | Resp 18 | Ht 62.0 in | Wt 216.4 lb

## 2021-03-12 DIAGNOSIS — E118 Type 2 diabetes mellitus with unspecified complications: Secondary | ICD-10-CM

## 2021-03-12 DIAGNOSIS — L03116 Cellulitis of left lower limb: Secondary | ICD-10-CM

## 2021-03-12 DIAGNOSIS — I872 Venous insufficiency (chronic) (peripheral): Secondary | ICD-10-CM

## 2021-03-12 NOTE — Progress Notes (Signed)
   Subjective:   Patient ID: Cheryl Delgado, female    DOB: 11-15-1945, 75 y.o.   MRN: [id number]  HPI The patient is a 75 YO female coming in for hospital follow up. In with cellulitis and finished antibiotics yesterday she was given on discharge. Overall swelling is improving some. Home health is supposed to be coming to take care of wound and has been out once only.   PMH, [facility], social history reviewed and updated  Review of Systems  Constitutional: Negative.   HENT: Negative.    Eyes: Negative.   Respiratory:  Negative for cough, chest tightness and shortness of breath.   Cardiovascular:  Positive for leg swelling. Negative for chest pain and palpitations.  Gastrointestinal:  Negative for abdominal distention, abdominal pain, constipation, diarrhea, nausea and vomiting.  Musculoskeletal: Negative.   Skin:  Positive for color change and wound.  Neurological: Negative.   Psychiatric/Behavioral: Negative.     Objective:  Physical Exam Constitutional:      Appearance: She is well-developed. She is obese.  HENT:     Head: Normocephalic and atraumatic.  Cardiovascular:     Rate and Rhythm: Normal rate and regular rhythm.  Pulmonary:     Effort: Pulmonary effort is normal. No respiratory distress.     Breath sounds: Normal breath sounds. No wheezing or rales.  Abdominal:     General: Bowel sounds are normal. There is no distension.     Palpations: Abdomen is soft.     Tenderness: There is no abdominal tenderness. There is no rebound.  Musculoskeletal:     Cervical back: Normal range of motion.     Right lower leg: Edema present.     Left lower leg: Edema present.  Skin:    General: Skin is warm and dry.     Comments: Left leg with wound on the front with good granulation tissue and not open, wound on the posterior calf which is also not open. There is some redness to the skin to about 2 cm below the knee which is less pink than prior. Swelling is also some improved. Skin is  peeling most of the leg and foot.   Neurological:     Mental Status: She is alert and oriented to person, place, and time.     Coordination: Coordination normal.    Vitals:   03/12/21 0951  BP: 130/80  Pulse: 85  Resp: 18  SpO2: 97%  Weight: 216 lb 6.4 oz (98.2 kg)  Height: 5\' 2"  (1.575 m)    This visit occurred during the SARS-CoV-2 public health emergency.  Safety protocols were in place, including screening questions prior to the visit, additional usage of staff PPE, and extensive cleaning of exam room while observing appropriate contact time as indicated for disinfecting solutions.   Assessment & Plan:

## 2021-03-12 NOTE — Patient Instructions (Signed)
We have sent in a 10 day course of the augmentin to start taking 1 pill twice a day if needed.   Watch for more redness, swelling, pain.   We will get you in with the wound center to help heal the leg and help with the swelling.

## 2021-03-13 ENCOUNTER — Encounter: Payer: Self-pay | Admitting: Internal Medicine

## 2021-03-13 MED ORDER — AMOXICILLIN-POT CLAVULANATE 875-125 MG PO TABS: 1.0000 | ORAL_TABLET | Freq: Two times a day (BID) | ORAL | 0 refills | Status: DC

## 2021-03-13 NOTE — Assessment & Plan Note (Signed)
Needs wound clinic referral given wounds on the left leg. Referral placed. We are keeping her torsemide 20 mg daily for fluid and keep leg elevated. Monitor and limit sodium intake.

## 2021-03-13 NOTE — Assessment & Plan Note (Signed)
She is aware that poor diabetes control can cause recurrent infection and poor wound healing. She is getting back on medications. Denies low sugars. Not checking consistently.

## 2021-03-13 NOTE — Assessment & Plan Note (Signed)
She is going to stay off antibiotics currently. Leg appears to be improving. I have sent in rx for augmentin 10 day course in case swelling or pain or redness worsens she is to start taking and contact us. She has had a total of 3-4 weeks of antibiotics total for this current cellulitis. Wounds are closed currently but concern for recurrence given poorly controlled diabetes so wound clinic referral done. She will also need wrapping if possible to help with swelling to avoid recurrence. She was referred to lymphedema clinic on discharge but they were unable to work with her due to this not being related to cancer.

## 2021-03-15 ENCOUNTER — Telehealth: Payer: Self-pay | Admitting: Internal Medicine

## 2021-03-15 NOTE — Telephone Encounter (Signed)
1.Medication Requested: glimepiride (AMARYL) 4 MG tablet  2. Pharmacy (Name, Street, [place]): [facility], [place]  3. On Med List: yes   4. Last Visit with PCP: 03-02-2021  5. Next visit date with PCP: n/a   Patient is requesting a 90 day supply, Dr. Jenny Reichmann is the prescribing provider

## 2021-03-16 ENCOUNTER — Encounter (HOSPITAL_BASED_OUTPATIENT_CLINIC_OR_DEPARTMENT_OTHER): Payer: Medicare HMO | Attending: Internal Medicine | Admitting: Internal Medicine

## 2021-03-16 ENCOUNTER — Other Ambulatory Visit: Payer: Self-pay

## 2021-03-16 ENCOUNTER — Telehealth: Payer: Self-pay

## 2021-03-16 DIAGNOSIS — F17290 Nicotine dependence, other tobacco product, uncomplicated: Secondary | ICD-10-CM | POA: Diagnosis not present

## 2021-03-16 DIAGNOSIS — I87312 Chronic venous hypertension (idiopathic) with ulcer of left lower extremity: Secondary | ICD-10-CM | POA: Diagnosis not present

## 2021-03-16 DIAGNOSIS — I890 Lymphedema, not elsewhere classified: Secondary | ICD-10-CM | POA: Diagnosis not present

## 2021-03-16 DIAGNOSIS — I872 Venous insufficiency (chronic) (peripheral): Secondary | ICD-10-CM | POA: Insufficient documentation

## 2021-03-16 DIAGNOSIS — I10 Essential (primary) hypertension: Secondary | ICD-10-CM | POA: Diagnosis not present

## 2021-03-16 DIAGNOSIS — E11622 Type 2 diabetes mellitus with other skin ulcer: Secondary | ICD-10-CM | POA: Diagnosis present

## 2021-03-16 DIAGNOSIS — Z7984 Long term (current) use of oral hypoglycemic drugs: Secondary | ICD-10-CM | POA: Diagnosis not present

## 2021-03-16 DIAGNOSIS — L97822 Non-pressure chronic ulcer of other part of left lower leg with fat layer exposed: Secondary | ICD-10-CM | POA: Insufficient documentation

## 2021-03-16 MED ORDER — GLIMEPIRIDE 4 MG PO TABS: 4.0000 mg | ORAL_TABLET | Freq: Every day | ORAL | 1 refills | Status: DC

## 2021-03-16 NOTE — Telephone Encounter (Signed)
Refill has been sent to the pharmacy.  

## 2021-03-16 NOTE — Telephone Encounter (Signed)
Okay to refill? 

## 2021-03-16 NOTE — Progress Notes (Signed)
Cheryl, Delgado ([id number]) Visit Report for 03/16/2021 Chief Complaint Document Details Patient Name: Date of Service: Cheryl Delgado, COUDRIET 03/16/2021 9:00 A M Medical Record Number: [id number] Patient Account Number: [id number] Date of Birth/Sex: Treating RN: [birth year] (75 y.o. Elam Dutch Primary Care Provider: Pricilla Holm Other Clinician: Referring Provider: Treating Provider/Extender: Verlene Mayer in Treatment: 0 Information Obtained from: Patient Chief Complaint Left lower extremity wound Electronic Signature(s) Signed: 03/16/2021 10:32:42 AM By: Kalman Shan DO Entered By: Kalman Shan on 03/16/2021 10:22:47 -------------------------------------------------------------------------------- HPI Details Patient Name: Date of Service: Cheryl Delgado, Marvel B. 03/16/2021 9:00 Clever Record Number: [id number] Patient Account Number: [id number] Date of Birth/Sex: Treating RN: 11-10-1945 (75 y.o. Elam Dutch Primary Care Provider: Pricilla Holm Other Clinician: Referring Provider: Treating Provider/Extender: Verlene Mayer in Treatment: 0 History of Present Illness HPI Description: Admission 03/16/2021 Cheryl Delgado is a 75 year old female with a past medical history of uncontrolled type 2 diabetes on oral agents with last hemoglobin A1c of 9, essential hypertension, chronic venous insufficiency and tobacco dependence that presents to the clinic for a 1 month history of left lower extremity wound that waxes and wanes. She was hospitalized on 03/02/2021 for left lower extremity cellulitis. She was treated with IV antibiotics and discharged with oral medication. She is currently not taking any antibiotics at this time. She reports improvement in her symptoms but continues to develop wounds to her lower extremity. She uses compression stockings daily. She currently denies signs of infection. Electronic  Signature(s) Signed: 03/16/2021 10:32:42 AM By: Kalman Shan DO Entered By: Kalman Shan on 03/16/2021 10:27:42 -------------------------------------------------------------------------------- Physical Exam Details Patient Name: Date of Service: Cheryl Delgado, Cheryl B. 03/16/2021 9:00 A M Medical Record Number: [id number] Patient Account Number: [id number] Date of Birth/Sex: Treating RN: 1945-04-29 (75 y.o. Elam Dutch Primary Care Provider: Pricilla Holm Other Clinician: Referring Provider: Treating Provider/Extender: Verlene Mayer in Treatment: 0 Constitutional respirations regular, non-labored and within target range for patient.. Cardiovascular 2+ dorsalis pedis/posterior tibialis pulses. Psychiatric pleasant and cooperative. Notes Left lower extremity with open wound to the anterior aspect with granulation tissue present. 2+ pitting edema to the knee. No obvious signs of infection. Electronic Signature(s) Signed: 03/16/2021 10:32:42 AM By: Kalman Shan DO Entered By: Kalman Shan on 03/16/2021 10:28:18 -------------------------------------------------------------------------------- Physician Orders Details Patient Name: Date of Service: Cheryl Delgado, Cheryl B. 03/16/2021 9:00 A M Medical Record Number: [id number] Patient Account Number: [id number] Date of Birth/Sex: Treating RN: 1945-12-22 (75 y.o. Cheryl Delgado Primary Care Provider: Pricilla Holm Other Clinician: Referring Provider: Treating Provider/Extender: Verlene Mayer in Treatment: 0 Verbal / Phone Orders: No Diagnosis Coding Follow-up Appointments ppointment in 1 week. - Dr. Heber [place] Return A Bathing/ Shower/ Hygiene May shower with protection but do not get wound dressing(s) wet. Edema Control - Lymphedema / SCD / Other Elevate legs to the level of the heart or above for 30 minutes daily and/or when sitting, a frequency of: - 3-4  times a day throughout the day. Avoid standing for long periods of time. Exercise regularly Moisturize legs daily. - right leg every night before bed. Compression stocking or Garment 20-30 mm/Hg pressure to: - right leg daily apply in the morning and remove at night. Additional Orders / Instructions Stop/Decrease Smoking Follow Nutritious Diet - work on lowering blood glucose. Home Health New wound care orders this week; continue Home Health for wound care. May utilize formulary equivalent dressing for wound treatment orders unless  otherwise specified. - Home Health to change dressing Monday or Tuesday; wound center Fridays. Other Home Health Orders/Instructions: - [facility] Wound Treatment Wound #1 - Lower Leg Wound Laterality: Left, Anterior Cleanser: Soap and Water (Home Health) 2 x Per Week/30 Days Discharge Instructions: May shower and wash wound with dial antibacterial soap and water prior to dressing change. Cleanser: Wound Cleanser (Home Health) 2 x Per Week/30 Days Discharge Instructions: Cleanse the wound with wound cleanser prior to applying a clean dressing using gauze sponges, not tissue or cotton balls. Peri-Wound Care: Sween Lotion (Moisturizing lotion) (Home Health) 2 x Per Week/30 Days Discharge Instructions: Apply moisturizing lotion as directed Prim Dressing: KerraCel Ag Gelling Fiber Dressing, 4x5 in (silver alginate) (Home Health) 2 x Per Week/30 Days ary Discharge Instructions: Apply silver alginate to wound bed as instructed Secondary Dressing: ABD Pad, 8x10 (Home Health) 2 x Per Week/30 Days Discharge Instructions: Apply over primary dressing as directed. Compression Wrap: ThreePress (3 layer compression wrap) (Home Health) 2 x Per Week/30 Days Discharge Instructions: Apply three layer compression as directed. Patient Medications llergies: No Known Drug Allergies A Notifications Medication Indication Start End benzocaine DOSE topical 20 % aerosol - aerosol  topical applied only in clinic for debridement by provider Electronic Signature(s) Signed: 03/16/2021 10:32:42 AM By: Kalman Shan DO Entered By: Kalman Shan on 03/16/2021 10:28:38 Prescription 03/16/2021 -------------------------------------------------------------------------------- Delgado, Cheryl B. Kalman Shan DO Patient Name: Provider: Jun 06, 1945 [id number] Date of Birth: NPI#: [id number] Sex: DEA #: [phone] Phone #: License #: [place] Patient Address: [place] [address] [place], [place] [address] [place], [place] [address] [phone] Allergies No Known Drug Allergies Medication Medication: Route: Strength: Form: benzocaine 20 % topical aerosol topical 20 % aerosol Class: TOPICAL LOCAL ANESTHETICS Dose: Frequency / Time: Indication: aerosol topical applied only in clinic for debridement by provider Number of Refills: Number of Units: 0 Generic Substitution: Start Date: End Date: One Time Use: Substitution Permitted No Note to Pharmacy: Hand Signature: Date(s): Electronic Signature(s) Signed: 03/16/2021 10:32:42 AM By: Kalman Shan DO Entered By: Kalman Shan on 03/16/2021 10:28:38 -------------------------------------------------------------------------------- Problem List Details Patient Name: Date of Service: Cheryl Delgado, Maebelle B. 03/16/2021 9:00 A M Medical Record Number: [id number] Patient Account Number: [id number] Date of Birth/Sex: Treating RN: 1946/01/27 (75 y.o. Martyn Malay, Linda Primary Care Provider: Pricilla Holm Other Clinician: Referring Provider: Treating Provider/Extender: Verlene Mayer in Treatment: 0 Active Problems ICD-10 Encounter Code Description Active Date MDM Diagnosis I87.312 Chronic venous hypertension (idiopathic) with ulcer of left lower extremity 03/16/2021 No Yes L97.822 Non-pressure chronic  ulcer of other part of left lower leg with fat layer exposed12/05/2020 No Yes E11.622 Type 2 diabetes mellitus with other skin ulcer 03/16/2021 No Yes I89.0 Lymphedema, not elsewhere classified 03/16/2021 No Yes F17.290 Nicotine dependence, other tobacco product, uncomplicated 70/07/8887 No Yes Inactive Problems Resolved Problems Electronic Signature(s) Signed: 03/16/2021 10:32:42 AM By: Kalman Shan DO Entered By: Kalman Shan on 03/16/2021 10:21:53 -------------------------------------------------------------------------------- Progress Note Details Patient Name: Date of Service: Cheryl Delgado, Kareena B. 03/16/2021 9:00 A M Medical Record Number: [id number] Patient Account Number: [id number] Date of Birth/Sex: Treating RN: [birth year] (75 y.o. Elam Dutch Primary Care Provider: Pricilla Holm Other Clinician: Referring Provider: Treating Provider/Extender: Verlene Mayer in Treatment: 0 Subjective Chief Complaint Information obtained from Patient Left lower extremity wound History of Present Illness (HPI) Admission 03/16/2021 Ms. Demiana Crumbley is a 75 year old female with a past medical  history of uncontrolled type 2 diabetes on oral agents with last hemoglobin A1c of 9, essential hypertension, chronic venous insufficiency and tobacco dependence that presents to the clinic for a 1 month history of left lower extremity wound that waxes and wanes. She was hospitalized on 03/02/2021 for left lower extremity cellulitis. She was treated with IV antibiotics and discharged with oral medication. She is currently not taking any antibiotics at this time. She reports improvement in her symptoms but continues to develop wounds to her lower extremity. She uses compression stockings daily. She currently denies signs of infection. Patient History Allergies No Known Drug Allergies Family History Diabetes - Mother, Heart Disease - Mother, No family history of Cancer,  Hereditary Spherocytosis, Hypertension, Kidney Disease, Lung Disease, Seizures, Stroke, Thyroid Problems, Tuberculosis. Social History Current every day smoker - 6 cig a day, Marital Status - Married, Alcohol Use - Never, Drug Use - No History, Caffeine Use - Moderate - coffee. Medical History Eyes Denies history of Cataracts, Glaucoma, Optic Neuritis Ear/Nose/Mouth/Throat Denies history of Chronic sinus problems/congestion, Middle ear problems Hematologic/Lymphatic Denies history of Anemia, Hemophilia, Human Immunodeficiency Virus, Lymphedema, Sickle Cell Disease Respiratory Denies history of Aspiration, Asthma, Chronic Obstructive Pulmonary Disease (COPD), Pneumothorax, Sleep Apnea, Tuberculosis Cardiovascular Patient has history of Hypertension Denies history of Angina, Arrhythmia, Congestive Heart Failure, Coronary Artery Disease, Deep Vein Thrombosis, Hypotension, Myocardial Infarction, Peripheral Arterial Disease, Peripheral Venous Disease, Phlebitis, Vasculitis Gastrointestinal Denies history of Cirrhosis , Colitis, Crohnoos, Hepatitis A, Hepatitis B, Hepatitis C Endocrine Patient has history of Type II Diabetes Denies history of Type I Diabetes Genitourinary Denies history of End Stage Renal Disease Immunological Denies history of Lupus Erythematosus, Raynaudoos, Scleroderma Integumentary (Skin) Denies history of History of Burn Musculoskeletal Denies history of Gout, Rheumatoid Arthritis, Osteoarthritis, Osteomyelitis Neurologic Denies history of Dementia, Neuropathy, Quadriplegia, Paraplegia, Seizure Disorder Oncologic Denies history of Received Chemotherapy, Received Radiation Psychiatric Denies history of Anorexia/bulimia, Confinement Anxiety Patient is treated with Controlled Diet, Oral Agents. Hospitalization/Surgery History - 03/02/2021-03/05/2021 Cellulitis. - hysterectomy. Medical A Surgical History Notes nd Constitutional Symptoms (General  Health) Cellulitis x4 weeks ago failed oral abx therapy, inpatient for IV 03/02/2021-03/05/2021 Eyes retinopathy right eye Review of Systems (ROS) Constitutional Symptoms (General Health) Denies complaints or symptoms of Fatigue, Fever, Chills, Marked Weight Change. Eyes Complains or has symptoms of Glasses / Contacts. Denies complaints or symptoms of Dry Eyes, Vision Changes. Ear/Nose/Mouth/Throat Denies complaints or symptoms of Chronic sinus problems or rhinitis. Respiratory Denies complaints or symptoms of Chronic or frequent coughs, Shortness of Breath. Cardiovascular Denies complaints or symptoms of Chest pain. Gastrointestinal Denies complaints or symptoms of Frequent diarrhea, Nausea, Vomiting. Endocrine Denies complaints or symptoms of Heat/cold intolerance. Genitourinary Denies complaints or symptoms of Frequent urination. Integumentary (Skin) Complains or has symptoms of Wounds - left leg. Musculoskeletal Denies complaints or symptoms of Muscle Pain, Muscle Weakness. Neurologic Denies complaints or symptoms of Numbness/parasthesias. Psychiatric Denies complaints or symptoms of Claustrophobia, Suicidal. Objective Constitutional respirations regular, non-labored and within target range for patient.. Vitals Time Taken: 9:05 AM, Height: 70 in, Source: Stated, Weight: 213 lbs, Source: Stated, BMI: 30.6, Temperature: 97.6 F, Pulse: 75 bpm, Respiratory Rate: 20 breaths/min, Blood Pressure: 125/78 mmHg. Cardiovascular 2+ dorsalis pedis/posterior tibialis pulses. Psychiatric pleasant and cooperative. General Notes: Left lower extremity with open wound to the anterior aspect with granulation tissue present. 2+ pitting edema to the knee. No obvious signs of infection. Integumentary (Hair, Skin) Wound #1 status is Open. Original cause of wound was Not Known. The date acquired was: 02/23/2021.  The wound is located on the Left,Anterior Lower Leg. The wound measures 2.5cm  length x 3.5cm width x 0.1cm depth; 6.872cm^2 area and 0.687cm^3 volume. There is Fat Layer (Subcutaneous Tissue) exposed. There is no tunneling or undermining noted. There is a medium amount of serosanguineous drainage noted. The wound margin is flat and intact. There is large (67-100%) red, pink granulation within the wound bed. There is a small (1-33%) amount of necrotic tissue within the wound bed including Adherent Slough. Assessment Active Problems ICD-10 Chronic venous hypertension (idiopathic) with ulcer of left lower extremity Non-pressure chronic ulcer of other part of left lower leg with fat layer exposed Type 2 diabetes mellitus with other skin ulcer Lymphedema, not elsewhere classified Nicotine dependence, other tobacco product, uncomplicated Patient presents with a 1 month history of left lower extremity wound complicated by cellulitis requiring hospital admission on 11/18. Currently there are no signs of infection on exam. Her ABIs were normal with a left of 1.01. At this time I recommended compression therapy with silver alginate. If this does not show significant improvement then she may benefit from an increased dose of her home torsemide dose. She knows to not get the Wrap wet and to call with any questions or concerns. Follow-up in 1 week. 46 minutes was spent on the encounter including face-to-face, EMR review and coordination of care Procedures Wound #1 Pre-procedure diagnosis of Wound #1 is a Diabetic Wound/Ulcer of the Lower Extremity located on the Left,Anterior Lower Leg . There was a Three Layer Compression Therapy Procedure by Deon Pilling, RN. Post procedure Diagnosis Wound #1: Same as Pre-Procedure Plan Follow-up Appointments: Return Appointment in 1 week. - Dr. Heber [place] Bathing/ Shower/ Hygiene: May shower with protection but do not get wound dressing(s) wet. Edema Control - Lymphedema / SCD / Other: Elevate legs to the level of the heart or above for 30  minutes daily and/or when sitting, a frequency of: - 3-4 times a day throughout the day. Avoid standing for long periods of time. Exercise regularly Moisturize legs daily. - right leg every night before bed. Compression stocking or Garment 20-30 mm/Hg pressure to: - right leg daily apply in the morning and remove at night. Additional Orders / Instructions: Stop/Decrease Smoking Follow Nutritious Diet - work on lowering blood glucose. Home Health: New wound care orders this week; continue Home Health for wound care. May utilize formulary equivalent dressing for wound treatment orders unless otherwise specified. - Home Health to change dressing Monday or Tuesday; wound center Fridays. Other Home Health Orders/Instructions: - [facility] The following medication(s) was prescribed: benzocaine topical 20 % aerosol aerosol topical applied only in clinic for debridement by provider was prescribed at facility WOUND #1: - Lower Leg Wound Laterality: Left, Anterior Cleanser: Soap and Water ([place]) 2 x Per Week/30 Days Discharge Instructions: May shower and wash wound with dial antibacterial soap and water prior to dressing change. Cleanser: Wound Cleanser (Home Health) 2 x Per Week/30 Days Discharge Instructions: Cleanse the wound with wound cleanser prior to applying a clean dressing using gauze sponges, not tissue or cotton balls. Peri-Wound Care: Sween Lotion (Moisturizing lotion) (Home Health) 2 x Per Week/30 Days Discharge Instructions: Apply moisturizing lotion as directed Prim Dressing: KerraCel Ag Gelling Fiber Dressing, 4x5 in (silver alginate) (Home Health) 2 x Per Week/30 Days ary Discharge Instructions: Apply silver alginate to wound bed as instructed Secondary Dressing: ABD Pad, 8x10 (Home Health) 2 x Per Week/30 Days Discharge Instructions: Apply over primary dressing as directed. Com pression  Wrap: ThreePress (3 layer compression wrap) (Home Health) 2 x Per Week/30  Days Discharge Instructions: Apply three layer compression as directed. 1. Silver alginate under 3 layer compression 2. Follow-up in 1 week Electronic Signature(s) Signed: 03/16/2021 10:32:42 AM By: Kalman Shan DO Entered By: Kalman Shan on 03/16/2021 10:31:30 -------------------------------------------------------------------------------- HxROS Details Patient Name: Date of Service: Cheryl Delgado, Tawanda B. 03/16/2021 9:00 A M Medical Record Number: [id number] Patient Account Number: [id number] Date of Birth/Sex: Treating RN: Jun 22, 1945 (75 y.o. Cheryl Delgado Primary Care Provider: Pricilla Holm Other Clinician: Referring Provider: Treating Provider/Extender: Verlene Mayer in Treatment: 0 Constitutional Symptoms (General Health) Complaints and Symptoms: Negative for: Fatigue; Fever; Chills; Marked Weight Change Medical History: Past Medical History Notes: Cellulitis x4 weeks ago failed oral abx therapy, inpatient for IV 03/02/2021-03/05/2021 Eyes Complaints and Symptoms: Positive for: Glasses / Contacts Negative for: Dry Eyes; Vision Changes Medical History: Negative for: Cataracts; Glaucoma; Optic Neuritis Past Medical History Notes: retinopathy right eye Ear/Nose/Mouth/Throat Complaints and Symptoms: Negative for: Chronic sinus problems or rhinitis Medical History: Negative for: Chronic sinus problems/congestion; Middle ear problems Respiratory Complaints and Symptoms: Negative for: Chronic or frequent coughs; Shortness of Breath Medical History: Negative for: Aspiration; Asthma; Chronic Obstructive Pulmonary Disease (COPD); Pneumothorax; Sleep Apnea; Tuberculosis Cardiovascular Complaints and Symptoms: Negative for: Chest pain Medical History: Positive for: Hypertension Negative for: Angina; Arrhythmia; Congestive Heart Failure; Coronary Artery Disease; Deep Vein Thrombosis; Hypotension; Myocardial Infarction;  Peripheral Arterial Disease; Peripheral Venous Disease; Phlebitis; Vasculitis Gastrointestinal Complaints and Symptoms: Negative for: Frequent diarrhea; Nausea; Vomiting Medical History: Negative for: Cirrhosis ; Colitis; Crohns; Hepatitis A; Hepatitis B; Hepatitis C Endocrine Complaints and Symptoms: Negative for: Heat/cold intolerance Medical History: Positive for: Type II Diabetes Negative for: Type I Diabetes Time with diabetes: 30 years Treated with: Oral agents, Diet Genitourinary Complaints and Symptoms: Negative for: Frequent urination Medical History: Negative for: End Stage Renal Disease Integumentary (Skin) Complaints and Symptoms: Positive for: Wounds - left leg Medical History: Negative for: History of Burn Musculoskeletal Complaints and Symptoms: Negative for: Muscle Pain; Muscle Weakness Medical History: Negative for: Gout; Rheumatoid Arthritis; Osteoarthritis; Osteomyelitis Neurologic Complaints and Symptoms: Negative for: Numbness/parasthesias Medical History: Negative for: Dementia; Neuropathy; Quadriplegia; Paraplegia; Seizure Disorder Psychiatric Complaints and Symptoms: Negative for: Claustrophobia; Suicidal Medical History: Negative for: Anorexia/bulimia; Confinement Anxiety Hematologic/Lymphatic Medical History: Negative for: Anemia; Hemophilia; Human Immunodeficiency Virus; Lymphedema; Sickle Cell Disease Immunological Medical History: Negative for: Lupus Erythematosus; Raynauds; Scleroderma Oncologic Medical History: Negative for: Received Chemotherapy; Received Radiation Immunizations Pneumococcal Vaccine: Received Pneumococcal Vaccination: Yes Received Pneumococcal Vaccination On or After 60th Birthday: Yes Implantable Devices No devices added Hospitalization / Surgery History Type of Hospitalization/Surgery 03/02/2021-03/05/2021 Cellulitis hysterectomy Family and Social History Cancer: No; Diabetes: Yes - Mother; Heart Disease:  Yes - Mother; Hereditary Spherocytosis: No; Hypertension: No; Kidney Disease: No; Lung Disease: No; Seizures: No; Stroke: No; Thyroid Problems: No; Tuberculosis: No; Current every day smoker - 6 cig a day; Marital Status - Married; Alcohol Use: Never; Drug Use: No History; Caffeine Use: Moderate - coffee; Financial Concerns: No; Food, Clothing or Shelter Needs: No; Support System Lacking: No; Transportation Concerns: No Electronic Signature(s) Signed: 03/16/2021 10:32:42 AM By: Kalman Shan DO Signed: 03/16/2021 12:39:40 PM By: Deon Pilling RN, BSN Entered By: Deon Pilling on 03/16/2021 09:22:29 -------------------------------------------------------------------------------- SuperBill Details Patient Name: Date of Service: Cheryl Delgado, Ramandeep B. 03/16/2021 Medical Record Number: [id number] Patient Account Number: [id number] Date of Birth/Sex: Treating RN: 06-17-1945 (75 y.o. Cheryl Delgado Primary Care Provider: Pricilla Holm Other Clinician: Referring  Provider: Treating Provider/Extender: Verlene Mayer in Treatment: 0 Diagnosis Coding ICD-10 Codes Code Description [phone] Chronic venous hypertension (idiopathic) with ulcer of left lower extremity L97.822 Non-pressure chronic ulcer of other part of left lower leg with fat layer exposed E11.622 Type 2 diabetes mellitus with other skin ulcer I89.0 Lymphedema, not elsewhere classified F17.290 Nicotine dependence, other tobacco product, uncomplicated Facility Procedures CPT4 Code: [id number] Description: 99213 - WOUND CARE VISIT-LEV 3 EST PT Modifier: Quantity: 1 Physician Procedures : CPT4 Code Description Modifier [id number] - WC PHYS LEVEL 4 - NEW PT ICD-10 Diagnosis Description I87.312 Chronic venous hypertension (idiopathic) with ulcer of left lower extremity L97.822 Non-pressure chronic ulcer of other part of left lower leg  with fat layer exposed E11.622 Type 2 diabetes mellitus with other  skin ulcer I89.0 Lymphedema, not elsewhere classified Quantity: 1 Electronic Signature(s) Signed: 03/16/2021 10:32:42 AM By: Kalman Shan DO Entered By: Kalman Shan on 03/16/2021 10:32:04

## 2021-03-16 NOTE — Progress Notes (Signed)
Cheryl Delgado, Cheryl Delgado ([id number]) Visit Report for 03/16/2021 Abuse/Suicide Risk Screen Details Patient Name: Date of Service: Cheryl Delgado, Cheryl Delgado 03/16/2021 9:00 A M Medical Record Number: [id number] Patient Account Number: [id number] Date of Birth/Sex: Treating RN: 1945-12-01 (75 y.o. Helene Shoe, Tammi Klippel Primary Care Naketa Daddario: Pricilla Holm Other Clinician: Referring Kaydee Magel: Treating Deanza Upperman/Extender: Verlene Mayer in Treatment: 0 Abuse/Suicide Risk Screen Items Answer ABUSE RISK SCREEN: Has anyone close to you tried to hurt or harm you recentlyo No Do you feel uncomfortable with anyone in your familyo No Has anyone forced you do things that you didnt want to doo No Electronic Signature(s) Signed: 03/16/2021 12:39:40 PM By: Deon Pilling RN, BSN Entered By: Deon Pilling on 03/16/2021 09:24:21 -------------------------------------------------------------------------------- Activities of Daily Living Details Patient Name: Date of Service: Cheryl Delgado, Cheryl B. 03/16/2021 9:00 [place] Record Number: [id number] Patient Account Number: [id number] Date of Birth/Sex: Treating RN: [birth year] (75 y.o. Helene Shoe, Tammi Klippel Primary Care Leo Fray: Pricilla Holm Other Clinician: Referring Carlen Rebuck: Treating Danielle Lento/Extender: Verlene Mayer in Treatment: 0 Activities of Daily Living Items Answer Activities of Daily Living (Please select one for each item) Drive Automobile Not Able T Medications ake Completely Able Use T elephone Completely Able Care for Appearance Completely Able Use T oilet Completely Able Bath / Shower Completely Able Dress Self Completely Able Feed Self Completely Able Walk Completely Able Get In / Out Bed Completely Able Housework Completely Able Prepare Meals Completely [place] for Self Completely Able Electronic Signature(s) Signed: 03/16/2021 12:39:40 PM By: Deon Pilling  RN, BSN Entered By: Deon Pilling on 03/16/2021 09:09:30 -------------------------------------------------------------------------------- Education Screening Details Patient Name: Date of Service: Cheryl Delgado, Cheryl B. 03/16/2021 9:00 A M Medical Record Number: [id number] Patient Account Number: [id number] Date of Birth/Sex: Treating RN: 11/22/1945 (75 y.o. Helene Shoe, Tammi Klippel Primary Care Vanesha [place]: Pricilla Holm Other Clinician: Referring Audie Wieser: Treating Krystofer Hevener/Extender: Verlene Mayer in Treatment: 0 Primary Learner Assessed: Patient Learning Preferences/Education Level/Primary Language Learning Preference: Explanation, Demonstration, Printed Material Highest Education Level: College or Above Preferred Language: English Cognitive Barrier Language Barrier: No Translator Needed: No Memory Deficit: No Emotional Barrier: No Cultural/Religious Beliefs Affecting Medical Care: No Physical Barrier Impaired Vision: No Impaired Hearing: No Decreased Hand dexterity: No Knowledge/Comprehension Knowledge Level: High Comprehension Level: High Ability to understand written instructions: High Ability to understand verbal instructions: High Motivation Anxiety Level: Calm Cooperation: Cooperative Education Importance: Acknowledges Need Interest in Health Problems: Asks Questions Perception: Coherent Willingness to Engage in Self-Management High Activities: Readiness to Engage in Self-Management High Activities: Electronic Signature(s) Signed: 03/16/2021 12:39:40 PM By: Deon Pilling RN, BSN Entered By: Deon Pilling on 03/16/2021 09:10:22 -------------------------------------------------------------------------------- Fall Risk Assessment Details Patient Name: Date of Service: Cheryl Delgado, Cheryl B. 03/16/2021 9:00 [place] Record Number: [id number] Patient Account Number: [id number] Date of Birth/Sex: Treating RN: May 27, 1945 (75 y.o. Helene Shoe,  Tammi Klippel Primary Care Nayelli Inglis: Pricilla Holm Other Clinician: Referring Lera Gaines: Treating Cristi Gwynn/Extender: Verlene Mayer in Treatment: 0 Fall Risk Assessment Items Have you had 2 or more falls in the last 12 monthso 0 No Have you had any fall that resulted in injury in the last 12 monthso 0 No FALLS RISK SCREEN History of falling - immediate or within 3 months 0 No Secondary diagnosis (Do you have 2 or more medical diagnoseso) 0 No Ambulatory aid None/bed rest/wheelchair/nurse 0 Yes Crutches/cane/walker 0 No Furniture 0 No Intravenous therapy Access/Saline/Heparin Lock 0 No Gait/Transferring Normal/ bed rest/ wheelchair 0  Yes Weak (short steps with or without shuffle, stooped but able to lift head while walking, may seek 0 No support from furniture) Impaired (short steps with shuffle, may have difficulty arising from chair, head down, impaired 0 No balance) Mental Status Oriented to own ability 0 Yes Electronic Signature(s) Signed: 03/16/2021 12:39:40 PM By: Deon Pilling RN, BSN Entered By: Deon Pilling on 03/16/2021 09:10:39 -------------------------------------------------------------------------------- Foot Assessment Details Patient Name: Date of Service: Cheryl Delgado, Cheryl B. 03/16/2021 9:00 [place] Record Number: [id number] Patient Account Number: [id number] Date of Birth/Sex: Treating RN: 05/09/1945 (75 y.o. [name] Primary Care Geniyah Eischeid: Pricilla Holm Other Clinician: Referring Shenice Dolder: Treating Donique Hammonds/Extender: Verlene Mayer in Treatment: 0 Foot Assessment Items Site Locations + = Sensation present, - = Sensation absent, C = Callus, U = Ulcer R = Redness, W = Warmth, M = Maceration, PU = Pre-ulcerative lesion F = Fissure, S = Swelling, D = Dryness Assessment Right: Left: Other Deformity: No No Prior Foot Ulcer: No No Prior Amputation: No No Charcot Joint: No No Ambulatory  Status: Ambulatory Without Help Gait: Steady Electronic Signature(s) Signed: 03/16/2021 12:39:40 PM By: Deon Pilling RN, BSN Entered By: Deon Pilling on 03/16/2021 09:26:27 -------------------------------------------------------------------------------- Nutrition Risk Screening Details Patient Name: Date of Service: Cheryl Delgado, Cheryl B. 03/16/2021 9:00 [place] Record Number: [id number] Patient Account Number: [id number] Date of Birth/Sex: Treating RN: Jun 10, 1945 (75 y.o. Helene Shoe, Tammi Klippel Primary Care Delphina Schum: Pricilla Holm Other Clinician: Referring Micheline Markes: Treating Janele Lague/Extender: Verlene Mayer in Treatment: 0 Height (in): 70 Weight (lbs): 213 Body Mass Index (BMI): 30.6 Nutrition Risk Screening Items Score Screening NUTRITION RISK SCREEN: I have an illness or condition that made me change the kind and/or amount of food I eat 2 Yes I eat fewer than two meals per day 0 No I eat few fruits and vegetables, or milk products 0 No I have three or more drinks of beer, liquor or wine almost every day 0 No I have tooth or mouth problems that make it hard for me to eat 0 No I don't always have enough money to buy the food I need 0 No I eat alone most of the time 0 No I take three or more different prescribed or over-the-counter drugs a day 1 Yes Without wanting to, I have lost or gained 10 pounds in the last six months 0 No I am not always physically able to shop, cook and/or feed myself 0 No Nutrition Protocols Good Risk Protocol Provide education on elevated blood Moderate Risk Protocol 0 sugars and impact on wound healing, as applicable High Risk Proctocol Risk Level: Moderate Risk Score: 3 Electronic Signature(s) Signed: 03/16/2021 12:39:40 PM By: Deon Pilling RN, BSN Entered By: Deon Pilling on 03/16/2021 09:11:13

## 2021-03-16 NOTE — Telephone Encounter (Signed)
Transition Care Management Unsuccessful Follow-up Telephone Call  Date of discharge and from where:  03/05/2021  [facility]  Attempts:  1st Attempt  Reason for unsuccessful TCM follow-up call:  No answer/busy  Tomasa Rand, RN, BSN, CEN [place] Coordinator [phone]

## 2021-03-19 ENCOUNTER — Telehealth: Payer: Self-pay | Admitting: Internal Medicine

## 2021-03-19 MED ORDER — FLUCONAZOLE 150 MG PO TABS: 150.0000 mg | ORAL_TABLET | ORAL | 0 refills | Status: DC

## 2021-03-19 NOTE — Telephone Encounter (Signed)
Sent in diflucan to take 1 pill when she gets it and then a second pill in 3 days if not better. Can use vitamin a and e ointment topically for irritation

## 2021-03-19 NOTE — Telephone Encounter (Signed)
Patient calling in  She says she has recently taken a bunch of antibiotics & now has a possible yeast infection... she said shes tried OTC monistat but it has caused her severe irritation & burning in that area  No available OV this week  Please call patient [phone]

## 2021-03-20 NOTE — Progress Notes (Signed)
Cheryl, Delgado ([id number]) Visit Report for 03/16/2021 Allergy List Details Patient Name: Date of Service: Cheryl Delgado, Cheryl Delgado 03/16/2021 9:00 A M Medical Record Number: [id number] Patient Account Number: [id number] Date of Birth/Sex: Treating RN: Aug 22, 1945 (75 y.o. Cheryl Delgado Primary Care Provider: Pricilla Holm Other Clinician: Referring Provider: Treating Provider/Extender: Verlene Mayer in Treatment: 0 Allergies Active Allergies No Known Drug Allergies Allergy Notes Electronic Signature(s) Signed: 03/16/2021 12:39:40 PM By: Deon Pilling RN, BSN Entered By: Deon Pilling on 03/16/2021 09:08:07 -------------------------------------------------------------------------------- Arrival Information Details Patient Name: Date of Service: Cheryl Delgado, Cheryl B. 03/16/2021 9:00 A M Medical Record Number: [id number] Patient Account Number: [id number] Date of Birth/Sex: Treating RN: February 25, 1946 (75 y.o. Cheryl Delgado Primary Care Provider: Pricilla Holm Other Clinician: Referring Provider: Treating Provider/Extender: Verlene Mayer in Treatment: 0 Visit Information Patient Arrived: Ambulatory Arrival Time: 09:05 Accompanied By: husband Transfer Assistance: None Patient Identification Verified: Yes Secondary Verification Process Completed: Yes Patient Requires Transmission-Based Precautions: No Patient Has Alerts: Yes Patient Alerts: ABI R1.05 L 1.01 11/22 TBI R0.7 L0.61 Electronic Signature(s) Signed: 03/16/2021 12:39:40 PM By: Deon Pilling RN, BSN Entered By: Deon Pilling on 03/16/2021 09:39:51 -------------------------------------------------------------------------------- Clinic Level of Care Assessment Details Patient Name: Date of Service: Cheryl Delgado, Cheryl B. 03/16/2021 9:00 A M Medical Record Number: [id number] Patient Account Number: [id number] Date of Birth/Sex: Treating RN: 09-08-1945 (75 y.o. Cheryl Delgado Primary Care Provider: Pricilla Holm Other Clinician: Referring Provider: Treating Provider/Extender: Verlene Mayer in Treatment: 0 Clinic Level of Care Assessment Items TOOL 1 Quantity Score X- 1 0 Use when EandM and Procedure is performed on INITIAL visit ASSESSMENTS - Nursing Assessment / Reassessment X- 1 20 General Physical Exam (combine w/ comprehensive assessment (listed just below) when performed on new pt. evals) X- 1 25 Comprehensive Assessment (HX, ROS, Risk Assessments, Wounds Hx, etc.) ASSESSMENTS - Wound and Skin Assessment / Reassessment X- 1 10 Dermatologic / Skin Assessment (not related to wound area) ASSESSMENTS - Ostomy and/or Continence Assessment and Care [] - 0 Incontinence Assessment and Management [] - 0 Ostomy Care Assessment and Management (repouching, etc.) PROCESS - Coordination of Care X - Simple Patient / Family Education for ongoing care 1 15 [] - 0 Complex (extensive) Patient / Family Education for ongoing care X- 1 10 Staff obtains Programmer, systems, Records, T Results / Process Orders est X- 1 10 Staff telephones HHA, Nursing Homes / Clarify orders / etc [] - 0 Routine Transfer to another Facility (non-emergent condition) [] - 0 Routine Hospital Admission (non-emergent condition) X- 1 15 New Admissions / Biomedical engineer / Ordering NPWT Apligraf, etc. , [] - 0 Emergency Hospital Admission (emergent condition) PROCESS - Special Needs [] - 0 Pediatric / Minor Patient Management [] - 0 Isolation Patient Management [] - 0 Hearing / Language / Visual special needs [] - 0 Assessment of Community assistance (transportation, D/C planning, etc.) [] - 0 Additional assistance / Altered mentation [] - 0 Support Surface(s) Assessment (bed, cushion, seat, etc.) INTERVENTIONS - Miscellaneous [] - 0 External ear exam [] - 0 Patient Transfer (multiple staff / Civil Service fast streamer / Similar devices) [] -  0 Simple Staple / Suture removal (25 or less) [] - 0 Complex Staple / Suture removal (26 or more) [] - 0 Hypo/Hyperglycemic Management (do not check if billed separately) [] - 0 Ankle / Brachial Index (ABI) - do not check if billed separately Has the patient been seen at the hospital within  the last three years: Yes Total Score: 105 Level Of Care: New/Established - Level 3 Electronic Signature(s) Signed: 03/16/2021 12:39:40 PM By: Deon Pilling RN, BSN Entered By: Deon Pilling on 03/16/2021 10:08:01 -------------------------------------------------------------------------------- Compression Therapy Details Patient Name: Date of Service: Cheryl Delgado, [name] 03/16/2021 9:00 A M Medical Record Number: [id number] Patient Account Number: [id number] Date of Birth/Sex: Treating RN: Mar 30, 1946 (75 y.o. Cheryl Delgado Primary Care Provider: Pricilla Holm Other Clinician: Referring Provider: Treating Provider/Extender: Verlene Mayer in Treatment: 0 Compression Therapy Performed for Wound Assessment: Wound #1 Left,Anterior Lower Leg Performed By: Clinician Deon Pilling, RN Compression Type: Three Layer Post Procedure Diagnosis Same as Pre-procedure Electronic Signature(s) Signed: 03/16/2021 12:39:40 PM By: Deon Pilling RN, BSN Entered By: Deon Pilling on 03/16/2021 10:06:49 -------------------------------------------------------------------------------- Encounter Discharge Information Details Patient Name: Date of Service: Cheryl Delgado, [name] 03/16/2021 9:00 [place] Record Number: [id number] Patient Account Number: [id number] Date of Birth/Sex: Treating RN: 08-Mar-1946 (75 y.o. Cheryl Delgado Primary Care Provider: Pricilla Holm Other Clinician: Referring Provider: Treating Provider/Extender: Verlene Mayer in Treatment: 0 Encounter Discharge Information Items Discharge Condition: Stable Ambulatory Status:  Ambulatory Discharge Destination: Home Transportation: Private Auto Accompanied By: self Schedule Follow-up Appointment: Yes Clinical Summary of Care: Electronic Signature(s) Signed: 03/16/2021 12:39:40 PM By: Deon Pilling RN, BSN Entered By: Deon Pilling on 03/16/2021 10:09:25 -------------------------------------------------------------------------------- Lower Extremity Assessment Details Patient Name: Date of Service: Cheryl Delgado, Cheryl B. 03/16/2021 9:00 A M Medical Record Number: [id number] Patient Account Number: [id number] Date of Birth/Sex: Treating RN: [birth year] (75 y.o. Cheryl Delgado Primary Care Provider: Pricilla Holm Other Clinician: Referring Provider: Treating Provider/Extender: Verlene Mayer in Treatment: 0 Edema Assessment Assessed: [Left: No] [Right: No] Edema: [Left: Ye] [Right: s] Calf Left: Right: Point of Measurement: 33.5 cm From Medial Instep 45.2 cm Ankle Left: Right: Point of Measurement: 11 cm From Medial Instep 31.5 cm Knee To Floor Left: Right: From Medial Instep 43.5 cm Vascular Assessment Pulses: Dorsalis Pedis Palpable: [Left:Yes] Electronic Signature(s) Signed: 03/16/2021 12:39:40 PM By: Deon Pilling RN, BSN Entered By: Deon Pilling on 03/16/2021 09:28:22 -------------------------------------------------------------------------------- Multi Wound Chart Details Patient Name: Date of Service: Cheryl Delgado, Cheryl B. 03/16/2021 9:00 A M Medical Record Number: [id number] Patient Account Number: [id number] Date of Birth/Sex: Treating RN: April 11, 1946 (75 y.o. Elam Dutch Primary Care Provider: Pricilla Holm Other Clinician: Referring Provider: Treating Provider/Extender: Verlene Mayer in Treatment: 0 Vital Signs Height(in): 70 Pulse(bpm): 75 Weight(lbs): 213 Blood Pressure(mmHg): 125/78 Body Mass Index(BMI): 31 Temperature(F): 97.6 Respiratory  Rate(breaths/min): 20 Photos: [N/A:N/A] Left, Anterior Lower Leg N/A N/A Wound Location: Not Known N/A N/A Wounding Event: Diabetic Wound/Ulcer of the Lower N/A N/A Primary Etiology: Extremity Hypertension, Type II Diabetes N/A N/A Comorbid History: 02/23/2021 N/A N/A Date Acquired: 0 N/A N/A Weeks of Treatment: Open N/A N/A Wound Status: 2.5x3.5x0.1 N/A N/A Measurements L x W x D (cm) 6.872 N/A N/A A (cm) : rea 0.687 N/A N/A Volume (cm) : 0.00% N/A N/A % Reduction in Area: 0.00% N/A N/A % Reduction in Volume: Grade 2 N/A N/A Classification: Medium N/A N/A Exudate A mount: Serosanguineous N/A N/A Exudate Type: red, brown N/A N/A Exudate Color: Flat and Intact N/A N/A Wound Margin: Large (67-100%) N/A N/A Granulation A mount: Red, Pink N/A N/A Granulation Quality: Small (1-33%) N/A N/A Necrotic A mount: Fat Layer (Subcutaneous Tissue): Yes N/A N/A Exposed Structures: Fascia: No Tendon: No Muscle: No Joint: No Bone: No Small (1-33%) N/A N/A  Epithelialization: Compression Therapy N/A N/A Procedures Performed: Treatment Notes Wound #1 (Lower Leg) Wound Laterality: Left, Anterior Cleanser Soap and Water Discharge Instruction: May shower and wash wound with dial antibacterial soap and water prior to dressing change. Wound Cleanser Discharge Instruction: Cleanse the wound with wound cleanser prior to applying a clean dressing using gauze sponges, not tissue or cotton balls. Peri-Wound Care Sween Lotion (Moisturizing lotion) Discharge Instruction: Apply moisturizing lotion as directed Topical Primary Dressing KerraCel Ag Gelling Fiber Dressing, 4x5 in (silver alginate) Discharge Instruction: Apply silver alginate to wound bed as instructed Secondary Dressing ABD Pad, 8x10 Discharge Instruction: Apply over primary dressing as directed. Secured With Compression Wrap ThreePress (3 layer compression wrap) Discharge Instruction: Apply three layer  compression as directed. Compression Stockings Add-Ons Electronic Signature(s) Signed: 03/16/2021 10:32:42 AM By: Kalman Shan DO Signed: 03/20/2021 4:29:15 PM By: Baruch Gouty RN, BSN Entered By: Kalman Shan on 03/16/2021 10:22:23 -------------------------------------------------------------------------------- Multi-Disciplinary Care Plan Details Patient Name: Date of Service: Cheryl Delgado, Cheryl B. 03/16/2021 9:00 A M Medical Record Number: [id number] Patient Account Number: [id number] Date of Birth/Sex: Treating RN: [birth year] (74 y.o. Cheryl Delgado, Cheryl Delgado Primary Care Deryck Hippler: Pricilla Holm Other Clinician: Referring Darcus Edds: Treating Kristjan Derner/Extender: Verlene Mayer in Treatment: 0 Active Inactive Nutrition Nursing Diagnoses: Impaired glucose control: actual or potential Goals: Patient/caregiver agrees to and verbalizes understanding of need to obtain nutritional consultation Date Initiated: 03/16/2021 Target Resolution Date: 04/19/2021 Goal Status: Active Patient/caregiver will maintain therapeutic glucose control Date Initiated: 03/16/2021 Target Resolution Date: 04/19/2021 Goal Status: Active Interventions: Assess HgA1c results as ordered upon admission and as needed Provide education on elevated blood sugars and impact on wound healing Provide education on nutrition Treatment Activities: Obtain HgA1c : 03/16/2021 Notes: Orientation to the Wound Care Program Nursing Diagnoses: Knowledge deficit related to the wound healing center program Goals: Patient/caregiver will verbalize understanding of the [place] Date Initiated: 03/16/2021 Target Resolution Date: 04/20/2021 Goal Status: Active Interventions: Provide education on orientation to the wound center Notes: Pain, Acute or Chronic Nursing Diagnoses: Pain, acute or chronic: actual or potential Potential alteration in comfort, pain Goals: Patient will  verbalize adequate pain control and receive pain control interventions during procedures as needed Date Initiated: 03/16/2021 Target Resolution Date: 04/20/2021 Goal Status: Active Patient/caregiver will verbalize comfort level met Date Initiated: 03/16/2021 Target Resolution Date: 04/20/2021 Goal Status: Active Interventions: Encourage patient to take pain medications as prescribed Provide education on pain management Reposition patient for comfort Treatment Activities: Administer pain control measures as ordered : 03/16/2021 Notes: Wound/Skin Impairment Nursing Diagnoses: Knowledge deficit related to ulceration/compromised skin integrity Goals: Patient/caregiver will verbalize understanding of skin care regimen Date Initiated: 03/16/2021 Target Resolution Date: 04/19/2021 Goal Status: Active Interventions: Assess patient/caregiver ability to obtain necessary supplies Assess patient/caregiver ability to perform ulcer/skin care regimen upon admission and as needed Provide education on ulcer and skin care Treatment Activities: Skin care regimen initiated : 03/16/2021 Topical wound management initiated : 03/16/2021 Notes: Electronic Signature(s) Signed: 03/16/2021 12:39:40 PM By: Deon Pilling RN, BSN Entered By: Deon Pilling on 03/16/2021 09:51:42 -------------------------------------------------------------------------------- Pain Assessment Details Patient Name: Date of Service: Cheryl Delgado, [place] B. 03/16/2021 9:00 A M Medical Record Number: [id number] Patient Account Number: [id number] Date of Birth/Sex: Treating RN: [birth year] (75 y.o. Cheryl Delgado Primary Care Ranata Laughery: Pricilla Holm Other Clinician: Referring Lajuan Godbee: Treating Dannell Raczkowski/Extender: Verlene Mayer in Treatment: 0 Active Problems Location of Pain Severity and Description of Pain Patient Has Paino Yes Site Locations Pain Location: Generalized Pain  Rate the pain. Current Pain  Level: 4 Worst Pain Level: 10 Least Pain Level: 0 Tolerable Pain Level: 7 Pain Management and Medication Current Pain Management: Medication: No Cold Application: No Rest: No Massage: No Activity: No T.E.N.S.: No Heat Application: No Leg drop or elevation: No Is the Current Pain Management Adequate: Adequate How does your wound impact your activities of daily livingo Sleep: No Bathing: No Appetite: No Relationship With Others: No Bladder Continence: No Emotions: No Bowel Continence: No Work: No Toileting: No Drive: No Dressing: No Hobbies: No Engineer, maintenance) Signed: 03/16/2021 12:39:40 PM By: Deon Pilling RN, BSN Entered By: Deon Pilling on 03/16/2021 09:11:46 -------------------------------------------------------------------------------- Patient/Caregiver Education Details Patient Name: Date of Service: Cheryl Delgado, Kamarri B. 12/2/2022andnbsp9:00 A M Medical Record Number: [id number] Patient Account Number: [id number] Date of Birth/Gender: Treating RN: [birth year] (75 y.o. Cheryl Delgado Primary Care Physician: Pricilla Holm Other Clinician: Referring Physician: Treating Physician/Extender: Verlene Mayer in Treatment: 0 Education Assessment Education Provided To: Patient Education Topics Provided Elevated Blood Sugar/ Impact on Healing: Handouts: Elevated Blood Sugars: How Do They Affect Wound Healing Methods: Explain/Verbal, Printed Responses: Reinforcements needed Venous: Handouts: Controlling Swelling with Compression Stockings Methods: Explain/Verbal, Printed Responses: Reinforcements needed Welcome T [facility] [place]: o Handouts: Welcome T [facility] [place] o Methods: Explain/Verbal, Printed Responses: Reinforcements needed Wound/Skin Impairment: Handouts: Caring for Your Ulcer, Skin Care Do's and Dont's Methods: Explain/Verbal, Printed Responses: Reinforcements needed Electronic  Signature(s) Signed: 03/16/2021 12:39:40 PM By: Deon Pilling RN, BSN Entered By: Deon Pilling on 03/16/2021 09:52:28 -------------------------------------------------------------------------------- Wound Assessment Details Patient Name: Date of Service: Cheryl Delgado, Jahnae B. 03/16/2021 9:00 A M Medical Record Number: [id number] Patient Account Number: [id number] Date of Birth/Sex: Treating RN: [birth year] (75 y.o. Cheryl Delgado, Meta.Reding Primary Care Provider: Pricilla Holm Other Clinician: Referring Provider: Treating Provider/Extender: Verlene Mayer in Treatment: 0 Wound Status Wound Number: 1 Primary Etiology: Diabetic Wound/Ulcer of the Lower Extremity Wound Location: Left, Anterior Lower Leg Wound Status: Open Wounding Event: Not Known Comorbid History: Hypertension, Type II Diabetes Date Acquired: 02/23/2021 Weeks Of Treatment: 0 Clustered Wound: No Photos Wound Measurements Length: (cm) 2.5 Width: (cm) 3.5 Depth: (cm) 0.1 Area: (cm) 6.872 Volume: (cm) 0.687 % Reduction in Area: 0% % Reduction in Volume: 0% Epithelialization: Small (1-33%) Tunneling: No Undermining: No Wound Description Classification: Grade 2 Wound Margin: Flat and Intact Exudate Amount: Medium Exudate Type: Serosanguineous Exudate Color: red, brown Foul Odor After Cleansing: No Slough/Fibrino Yes Wound Bed Granulation Amount: Large (67-100%) Exposed Structure Granulation Quality: Red, Pink Fascia Exposed: No Necrotic Amount: Small (1-33%) Fat Layer (Subcutaneous Tissue) Exposed: Yes Necrotic Quality: Adherent Slough Tendon Exposed: No Muscle Exposed: No Joint Exposed: No Bone Exposed: No Treatment Notes Wound #1 (Lower Leg) Wound Laterality: Left, Anterior Cleanser Soap and Water Discharge Instruction: May shower and wash wound with dial antibacterial soap and water prior to dressing change. Wound Cleanser Discharge Instruction: Cleanse the wound with  wound cleanser prior to applying a clean dressing using gauze sponges, not tissue or cotton balls. Peri-Wound Care Sween Lotion (Moisturizing lotion) Discharge Instruction: Apply moisturizing lotion as directed Topical Primary Dressing KerraCel Ag Gelling Fiber Dressing, 4x5 in (silver alginate) Discharge Instruction: Apply silver alginate to wound bed as instructed Secondary Dressing ABD Pad, 8x10 Discharge Instruction: Apply over primary dressing as directed. Secured With Compression Wrap ThreePress (3 layer compression wrap) Discharge Instruction: Apply three layer compression as directed. Compression Stockings Add-Ons Electronic Signature(s) Signed: 03/16/2021 12:39:40 PM By: Deon Pilling  RN, BSN Signed: 03/20/2021 5:02:41 PM By: Dellie Catholic RN Entered By: Dellie Catholic on 03/16/2021 09:38:04 -------------------------------------------------------------------------------- [place] Details Patient Name: Date of Service: Cheryl Delgado, Cheryl B. 03/16/2021 9:00 A M Medical Record Number: [id number] Patient Account Number: [id number] Date of Birth/Sex: Treating RN: 1945/10/11 (75 y.o. Cheryl Delgado, Cheryl Delgado Primary Care Provider: Pricilla Holm Other Clinician: Referring Provider: Treating Provider/Extender: Verlene Mayer in Treatment: 0 Vital Signs Time Taken: 09:05 Temperature (F): 97.6 Height (in): 70 Pulse (bpm): 75 Source: Stated Respiratory Rate (breaths/min): 20 Weight (lbs): 213 Blood Pressure (mmHg): 125/78 Source: Stated Reference Range: 80 - 120 mg / dl Body Mass Index (BMI): 30.6 Electronic Signature(s) Signed: 03/16/2021 12:39:40 PM By: Deon Pilling RN, BSN Entered By: Deon Pilling on 03/16/2021 09:10:03

## 2021-03-21 ENCOUNTER — Telehealth: Payer: Self-pay

## 2021-03-21 NOTE — Telephone Encounter (Signed)
Transition Care Management Unsuccessful Follow-up Telephone Call  Date of discharge and from where:  03/05/2021  [facility]  Attempts:  2nd Attempt  Reason for unsuccessful TCM follow-up call:  No answer/busy  Tomasa Rand, RN, BSN, CEN [place] Coordinator [phone]

## 2021-03-23 ENCOUNTER — Other Ambulatory Visit: Payer: Self-pay

## 2021-03-23 ENCOUNTER — Encounter (HOSPITAL_BASED_OUTPATIENT_CLINIC_OR_DEPARTMENT_OTHER): Payer: Medicare HMO | Admitting: Internal Medicine

## 2021-03-23 DIAGNOSIS — L97822 Non-pressure chronic ulcer of other part of left lower leg with fat layer exposed: Secondary | ICD-10-CM

## 2021-03-23 DIAGNOSIS — E11622 Type 2 diabetes mellitus with other skin ulcer: Secondary | ICD-10-CM

## 2021-03-23 DIAGNOSIS — I87312 Chronic venous hypertension (idiopathic) with ulcer of left lower extremity: Secondary | ICD-10-CM

## 2021-03-23 NOTE — Progress Notes (Signed)
[name], [name] ([id number]) Visit Report for 03/23/2021 Arrival Information Details Patient Name: Date of Service: Cheryl Delgado, Cheryl Delgado 03/23/2021 10:15 A M Medical Record Number: [id number] Patient Account Number: [id number] Date of Birth/Sex: Treating RN: 08-02-1945 (75 y.o. Elam Dutch Primary Care Nathalia Wismer: Pricilla Holm Other Clinician: Referring Darshay Deupree: Treating Kohl Polinsky/Extender: Verlene Mayer in Treatment: 1 Visit Information History Since Last Visit Added or deleted any medications: No Patient Arrived: Ambulatory Any new allergies or adverse reactions: No Arrival Time: 10:39 Had a fall or experienced change in No Accompanied By: spouse activities of daily living that may affect Transfer Assistance: None risk of falls: Patient Identification Verified: Yes Signs or symptoms of abuse/neglect since last visito No Secondary Verification Process Completed: Yes Hospitalized since last visit: No Patient Requires Transmission-Based Precautions: No Implantable device outside of the clinic excluding No Patient Has Alerts: Yes cellular tissue based products placed in the center Patient Alerts: ABI R1.05 L 1.01 11/22 since last visit: TBI R0.7 L0.61 Has Dressing in Place as Prescribed: Yes Has Compression in Place as Prescribed: Yes Pain Present Now: Yes Electronic Signature(s) Signed: 03/23/2021 12:28:25 PM By: Baruch Gouty RN, BSN Entered By: Baruch Gouty on 03/23/2021 10:44:33 -------------------------------------------------------------------------------- Clinic Level of Care Assessment Details Patient Name: Date of Service: Cheryl Delgado, Cheryl B. 03/23/2021 10:15 A M Medical Record Number: [id number] Patient Account Number: [id number] Date of Birth/Sex: Treating RN: 02/15/1946 (75 y.o. Elam Dutch Primary Care Parthena Fergeson: Pricilla Holm Other Clinician: Referring Miyonna Ormiston: Treating Mcihael Hinderman/Extender: Verlene Mayer in Treatment: 1 Clinic Level of Care Assessment Items TOOL 4 Quantity Score []  - 0 Use when only an EandM is performed on FOLLOW-UP visit ASSESSMENTS - Nursing Assessment / Reassessment X- 1 10 Reassessment of Co-morbidities (includes updates in patient status) X- 1 5 Reassessment of Adherence to Treatment Plan ASSESSMENTS - Wound and Skin A ssessment / Reassessment X - Simple Wound Assessment / Reassessment - one wound 1 5 []  - 0 Complex Wound Assessment / Reassessment - multiple wounds []  - 0 Dermatologic / Skin Assessment (not related to wound area) ASSESSMENTS - Focused Assessment X- 1 5 Circumferential Edema Measurements - multi extremities []  - 0 Nutritional Assessment / Counseling / Intervention X- 1 5 Lower Extremity Assessment (monofilament, tuning fork, pulses) []  - 0 Peripheral Arterial Disease Assessment (using hand held doppler) ASSESSMENTS - Ostomy and/or Continence Assessment and Care []  - 0 Incontinence Assessment and Management []  - 0 Ostomy Care Assessment and Management (repouching, etc.) PROCESS - Coordination of Care X - Simple Patient / Family Education for ongoing care 1 15 []  - 0 Complex (extensive) Patient / Family Education for ongoing care X- 1 10 Staff obtains Programmer, systems, Records, T Results / Process Orders est []  - 0 Staff telephones HHA, Nursing Homes / Clarify orders / etc []  - 0 Routine Transfer to another Facility (non-emergent condition) []  - 0 Routine Hospital Admission (non-emergent condition) []  - 0 New Admissions / Biomedical engineer / Ordering NPWT Apligraf, etc. , []  - 0 Emergency Hospital Admission (emergent condition) X- 1 10 Simple Discharge Coordination []  - 0 Complex (extensive) Discharge Coordination PROCESS - Special Needs []  - 0 Pediatric / Minor Patient Management []  - 0 Isolation Patient Management []  - 0 Hearing / Language / Visual special needs []  - 0 Assessment of  Community assistance (transportation, D/C planning, etc.) []  - 0 Additional assistance / Altered mentation []  - 0 Support Surface(s) Assessment (bed, cushion, seat, etc.) INTERVENTIONS - Wound Cleansing / Measurement  X - Simple Wound Cleansing - one wound 1 5 []  - 0 Complex Wound Cleansing - multiple wounds X- 1 5 Wound Imaging (photographs - any number of wounds) []  - 0 Wound Tracing (instead of photographs) []  - 0 Simple Wound Measurement - one wound []  - 0 Complex Wound Measurement - multiple wounds INTERVENTIONS - Wound Dressings []  - 0 Small Wound Dressing one or multiple wounds []  - 0 Medium Wound Dressing one or multiple wounds []  - 0 Large Wound Dressing one or multiple wounds []  - 0 Application of Medications - topical []  - 0 Application of Medications - injection INTERVENTIONS - Miscellaneous []  - 0 External ear exam []  - 0 Specimen Collection (cultures, biopsies, blood, body fluids, etc.) []  - 0 Specimen(s) / Culture(s) sent or taken to Lab for analysis []  - 0 Patient Transfer (multiple staff / Civil Service fast streamer / Similar devices) []  - 0 Simple Staple / Suture removal (25 or less) []  - 0 Complex Staple / Suture removal (26 or more) []  - 0 Hypo / Hyperglycemic Management (close monitor of Blood Glucose) []  - 0 Ankle / Brachial Index (ABI) - do not check if billed separately X- 1 5 Vital Signs Has the patient been seen at the hospital within the last three years: Yes Total Score: 80 Level Of Care: New/Established - Level 3 Electronic Signature(s) Signed: 03/23/2021 12:28:25 PM By: Baruch Gouty RN, BSN Entered By: Baruch Gouty on 03/23/2021 11:35:15 -------------------------------------------------------------------------------- Encounter Discharge Information Details Patient Name: Date of Service: Cheryl Delgado, Cheryl B. 03/23/2021 10:15 A M Medical Record Number: [id number] Patient Account Number: [id number] Date of Birth/Sex: Treating RN: 11-18-1945 (75 y.o.  Elam Dutch Primary Care Jaynie Hitch: Pricilla Holm Other Clinician: Referring Dontai Pember: Treating Won Kreuzer/Extender: Verlene Mayer in Treatment: 1 Encounter Discharge Information Items Discharge Condition: Stable Ambulatory Status: Ambulatory Discharge Destination: Home Transportation: Private Auto Accompanied By: spouse Schedule Follow-up Appointment: Yes Clinical Summary of Care: Patient Declined Electronic Signature(s) Signed: 03/23/2021 12:28:25 PM By: Baruch Gouty RN, BSN Entered By: Baruch Gouty on 03/23/2021 11:43:01 -------------------------------------------------------------------------------- Lower Extremity Assessment Details Patient Name: Date of Service: [place] Cheryl Delgado, Adahlia B. 03/23/2021 10:15 A M Medical Record Number: [id number] Patient Account Number: [id number] Date of Birth/Sex: Treating RN: 1945-09-05 (75 y.o. Elam Dutch Primary Care Lucy Boardman: Pricilla Holm Other Clinician: Referring Rosamae Rocque: Treating Charlsey Moragne/Extender: Verlene Mayer in Treatment: 1 Edema Assessment Assessed: [Left: No] [Right: No] Edema: [Left: Ye] [Right: s] Calf Left: Right: Point of Measurement: 33.5 cm From Medial Instep 38.7 cm Ankle Left: Right: Point of Measurement: 11 cm From Medial Instep 29.7 cm Knee To Floor Left: Right: From Medial Instep 37 cm Vascular Assessment Pulses: Dorsalis Pedis Palpable: [Left:Yes] Electronic Signature(s) Signed: 03/23/2021 12:28:25 PM By: Baruch Gouty RN, BSN Entered By: Baruch Gouty on 03/23/2021 11:04:56 -------------------------------------------------------------------------------- Multi Wound Chart Details Patient Name: Date of Service: Cheryl Delgado, Alanee B. 03/23/2021 10:15 A M Medical Record Number: [id number] Patient Account Number: [id number] Date of Birth/Sex: Treating RN: [birth year] (75 y.o. Elam Dutch Primary Care Shirla Hodgkiss: Pricilla Holm Other Clinician: Referring Kenyan Karnes: Treating Shaquoya Cosper/Extender: Verlene Mayer in Treatment: 1 Vital Signs Height(in): 70 Pulse(bpm): 105 Weight(lbs): 213 Blood Pressure(mmHg): 137/81 Body Mass Index(BMI): 31 Temperature(F): 98.4 Respiratory Rate(breaths/min): 18 Photos: [1:No Photos Left, Anterior Lower Leg] [N/A:N/A N/A] Wound Location: [1:Not Known] [N/A:N/A] Wounding Event: [1:Diabetic Wound/Ulcer of the Lower] [N/A:N/A] Primary Etiology: [1:Extremity Hypertension, Type II Diabetes] [N/A:N/A] Comorbid History: [1:02/23/2021] [N/A:N/A] Date Acquired: [1:1] [N/A:N/A] Weeks of Treatment: [1:Healed -  Epithelialized] [N/A:N/A] Wound Status: [1:0x0x0] [N/A:N/A] Measurements L x W x D (cm) [1:0] [N/A:N/A] A (cm) : rea [1:0] [N/A:N/A] Volume (cm) : [1:100.00%] [N/A:N/A] % Reduction in A rea: [1:100.00%] [N/A:N/A] % Reduction in Volume: [1:Grade 2] [N/A:N/A] Classification: [1:None Present] [N/A:N/A] Exudate A mount: [1:None Present (0%)] [N/A:N/A] Granulation A mount: [1:None Present (0%)] [N/A:N/A] Necrotic A mount: [1:Fat Layer (Subcutaneous Tissue): Yes N/A] Exposed Structures: [1:Fascia: No Tendon: No Muscle: No Joint: No Bone: No Large (67-100%)] [N/A:N/A] Treatment Notes Electronic Signature(s) Signed: 03/23/2021 11:57:49 AM By: Kalman Shan DO Signed: 03/23/2021 12:28:25 PM By: Baruch Gouty RN, BSN Entered By: Kalman Shan on 03/23/2021 11:51:57 -------------------------------------------------------------------------------- Multi-Disciplinary Care Plan Details Patient Name: Date of Service: Cheryl Delgado, Cheryl B. 03/23/2021 10:15 A M Medical Record Number: [id number] Patient Account Number: [id number] Date of Birth/Sex: Treating RN: 04-11-1946 (75 y.o. Elam Dutch Primary Care Arena Lindahl: Pricilla Holm Other Clinician: Referring Nelsie Domino: Treating Taija Mathias/Extender: Verlene Mayer in Treatment: 1 Active Inactive Electronic Signature(s) Signed: 03/23/2021 12:28:25 PM By: Baruch Gouty RN, BSN Entered By: Baruch Gouty on 03/23/2021 10:59:55 -------------------------------------------------------------------------------- Pain Assessment Details Patient Name: Date of Service: Cheryl Delgado, Cheryl B. 03/23/2021 10:15 A M Medical Record Number: [id number] Patient Account Number: [id number] Date of Birth/Sex: Treating RN: 10-20-1945 (75 y.o. Elam Dutch Primary Care Adelayde Minney: Pricilla Holm Other Clinician: Referring Sylus Stgermain: Treating Adenike Shidler/Extender: Verlene Mayer in Treatment: 1 Active Problems Location of Pain Severity and Description of Pain Patient Has Paino Yes Site Locations Pain Location: Pain in Ulcers With Dressing Change: Yes Duration of the Pain. Constant / Intermittento Intermittent Rate the pain. Current Pain Level: 5 Worst Pain Level: 6 Least Pain Level: 0 Character of Pain Describe the Pain: Burning, Shooting Pain Management and Medication Current Pain Management: Rest: Yes Other: time Is the Current Pain Management Adequate: Adequate How does your wound impact your activities of daily livingo Sleep: No Bathing: No Appetite: No Relationship With Others: No Bladder Continence: No Emotions: No Bowel Continence: No Work: No Toileting: No Drive: No Dressing: No Hobbies: No Electronic Signature(s) Signed: 03/23/2021 12:28:25 PM By: Baruch Gouty RN, BSN Entered By: Baruch Gouty on 03/23/2021 10:46:16 -------------------------------------------------------------------------------- Patient/Caregiver Education Details Patient Name: Date of Service: Cheryl Delgado, Cheryl B. 12/9/2022andnbsp10:15 [place] Record Number: [id number] Patient Account Number: [id number] Date of Birth/Gender: Treating RN: 1945-08-19 (74 y.o. Elam Dutch Primary Care Physician: Pricilla Holm Other Clinician: Referring Physician: Treating Physician/Extender: Verlene Mayer in Treatment: 1 Education Assessment Education Provided To: Patient Education Topics Provided Venous: Methods: Explain/Verbal Responses: Reinforcements needed, State content correctly Wound/Skin Impairment: Methods: Explain/Verbal Responses: Reinforcements needed, State content correctly Electronic Signature(s) Signed: 03/23/2021 12:28:25 PM By: Baruch Gouty RN, BSN Entered By: Baruch Gouty on 03/23/2021 11:04:14 -------------------------------------------------------------------------------- Wound Assessment Details Patient Name: Date of Service: Cheryl Delgado, Cheryl B. 03/23/2021 10:15 A M Medical Record Number: [id number] Patient Account Number: [id number] Date of Birth/Sex: Treating RN: March 15, 1946 (75 y.o. Elam Dutch Primary Care Itzia Cunliffe: Pricilla Holm Other Clinician: Referring Jaylend Reiland: Treating Trip Cavanagh/Extender: Verlene Mayer in Treatment: 1 Wound Status Wound Number: 1 Primary Etiology: Diabetic Wound/Ulcer of the Lower Extremity Wound Location: Left, Anterior Lower Leg Wound Status: Healed - Epithelialized Wounding Event: Not Known Comorbid History: Hypertension, Type II Diabetes Date Acquired: 02/23/2021 Weeks Of Treatment: 1 Clustered Wound: No Wound Measurements Length: (cm) Width: (cm) Depth: (cm) Area: (cm) Volume: (cm) 0 % Reduction in Area: 100% 0 % Reduction in Volume: 100% 0 Epithelialization: Large (67-100%)  0 Tunneling: No 0 Undermining: No Wound Description Classification: Grade 2 Exudate Amount: None Present Foul Odor After Cleansing: No Slough/Fibrino No Wound Bed Granulation Amount: None Present (0%) Exposed Structure Necrotic Amount: None Present (0%) Fascia Exposed: No Fat Layer (Subcutaneous Tissue) Exposed: Yes Tendon Exposed: No Muscle Exposed: No Joint Exposed:  No Bone Exposed: No Electronic Signature(s) Signed: 03/23/2021 12:28:25 PM By: Baruch Gouty RN, BSN Entered By: Baruch Gouty on 03/23/2021 10:59:12 -------------------------------------------------------------------------------- [place] Details Patient Name: Date of Service: Cheryl Delgado, Cheryl B. 03/23/2021 10:15 A M Medical Record Number: [id number] Patient Account Number: [id number] Date of Birth/Sex: Treating RN: 12/31/1945 (75 y.o. Elam Dutch Primary Care Deryck Hippler: Pricilla Holm Other Clinician: Referring Jasman Murri: Treating Lakesa Coste/Extender: Verlene Mayer in Treatment: 1 Vital Signs Time Taken: 10:44 Temperature (F): 98.4 Height (in): 70 Pulse (bpm): 80 Source: Stated Respiratory Rate (breaths/min): 18 Weight (lbs): 213 Blood Pressure (mmHg): 137/81 Source: Stated Reference Range: 80 - 120 mg / dl Body Mass Index (BMI): 30.6 Electronic Signature(s) Signed: 03/23/2021 12:28:25 PM By: Baruch Gouty RN, BSN Entered By: Baruch Gouty on 03/23/2021 10:45:07

## 2021-03-23 NOTE — Progress Notes (Signed)
Cheryl Delgado, Cheryl Delgado ([id number]) Visit Report for 03/23/2021 Chief Complaint Document Details Patient Name: Date of Service: Cheryl Delgado, Cheryl Delgado 03/23/2021 10:15 A M Medical Record Number: [id number] Patient Account Number: [id number] Date of Birth/Sex: Treating RN: [birth year] (75 y.o. Elam Dutch Primary Care Provider: Pricilla Holm Other Clinician: Referring Provider: Treating Provider/Extender: Verlene Mayer in Treatment: 1 Information Obtained from: Patient Chief Complaint Left lower extremity wound Electronic Signature(s) Signed: 03/23/2021 11:57:49 AM By: Kalman Shan DO Entered By: Kalman Shan on 03/23/2021 11:52:08 -------------------------------------------------------------------------------- HPI Details Patient Name: Date of Service: Cheryl Delgado, Cheryl B. 03/23/2021 10:15 A M Medical Record Number: [id number] Patient Account Number: [id number] Date of Birth/Sex: Treating RN: October 16, 1945 (75 y.o. Elam Dutch Primary Care Provider: Pricilla Holm Other Clinician: Referring Provider: Treating Provider/Extender: Verlene Mayer in Treatment: 1 History of Present Illness HPI Description: Admission 03/16/2021 Cheryl Delgado is a 75 year old female with a past medical history of uncontrolled type 2 diabetes on oral agents with last hemoglobin A1c of 9, essential hypertension, chronic venous insufficiency and tobacco dependence that presents to the clinic for a 1 month history of left lower extremity wound that waxes and wanes. She was hospitalized on 03/02/2021 for left lower extremity cellulitis. She was treated with IV antibiotics and discharged with oral medication. She is currently not taking any antibiotics at this time. She reports improvement in her symptoms but continues to develop wounds to her lower extremity. She uses compression stockings daily. She currently denies signs of  infection. 03/23/2021; patient presents for follow-up. She tolerated the compression wrap well. She has no issues or complaints today. Electronic Signature(s) Signed: 03/23/2021 11:57:49 AM By: Kalman Shan DO Entered By: Kalman Shan on 03/23/2021 11:52:47 -------------------------------------------------------------------------------- Physical Exam Details Patient Name: Date of Service: Cheryl Delgado, Cheryl B. 03/23/2021 10:15 A M Medical Record Number: [id number] Patient Account Number: [id number] Date of Birth/Sex: Treating RN: 1945-10-01 (75 y.o. Elam Dutch Primary Care Provider: Other Clinician: Pricilla Holm Referring Provider: Treating Provider/Extender: Verlene Mayer in Treatment: 1 Constitutional respirations regular, non-labored and within target range for patient.. Cardiovascular 2+ dorsalis pedis/posterior tibialis pulses. Psychiatric pleasant and cooperative. Notes Left lower extremity with epithelialization to the previous wound site. Good edema control. Surrounding skin is intact. Electronic Signature(s) Signed: 03/23/2021 11:57:49 AM By: Kalman Shan DO Entered By: Kalman Shan on 03/23/2021 11:53:16 -------------------------------------------------------------------------------- Physician Orders Details Patient Name: Date of Service: Cheryl Delgado, Cheryl B. 03/23/2021 10:15 A M Medical Record Number: [id number] Patient Account Number: [id number] Date of Birth/Sex: Treating RN: 1945-06-17 (75 y.o. Elam Dutch Primary Care Provider: Pricilla Holm Other Clinician: Referring Provider: Treating Provider/Extender: Verlene Mayer in Treatment: 1 Verbal / Phone Orders: No Diagnosis Coding [facility]charge [facility] Discharge from [place] Bathing/ Shower/ Hygiene May shower and wash wound with soap and water. Edema Control - Lymphedema / SCD / Other Elevate legs to the  level of the heart or above for 30 minutes daily and/or when sitting, a frequency of: - 3-4 times a day throughout the day. Avoid standing for long periods of time. Exercise regularly Moisturize legs daily. - both legs every night before bed after removing stockings Compression stocking or Garment 20-30 mm/Hg pressure to: - both legs daily Additional Orders / Instructions Stop/Decrease Smoking Follow Nutritious Diet - work on lowering blood glucose. [facility] for wound care. - wound is healed Other Home Health Orders/Instructions: - Passenger transport manager) Signed: 03/23/2021 11:57:49  AM By: Kalman Shan DO Entered By: Kalman Shan on 03/23/2021 11:54:15 -------------------------------------------------------------------------------- Problem List Details Patient Name: Date of Service: Cheryl Delgado, Cheryl Delgado 03/23/2021 10:15 A M Medical Record Number: [id number] Patient Account Number: [id number] Date of Birth/Sex: Treating RN: July 10, 1945 (75 y.o. Martyn Malay, Linda Primary Care Provider: Pricilla Holm Other Clinician: Referring Provider: Treating Provider/Extender: Verlene Mayer in Treatment: 1 Active Problems ICD-10 Encounter Code Description Active Date MDM Diagnosis I87.312 Chronic venous hypertension (idiopathic) with ulcer of left lower extremity 03/16/2021 No Yes L97.822 Non-pressure chronic ulcer of other part of left lower leg with fat layer exposed12/05/2020 No Yes E11.622 Type 2 diabetes mellitus with other skin ulcer 03/16/2021 No Yes I89.0 Lymphedema, not elsewhere classified 03/16/2021 No Yes F17.290 Nicotine dependence, other tobacco product, uncomplicated 96/05/2295 No Yes Inactive Problems Resolved Problems Electronic Signature(s) Signed: 03/23/2021 11:57:49 AM By: Kalman Shan DO Entered By: Kalman Shan on 03/23/2021  11:51:46 -------------------------------------------------------------------------------- Progress Note Details Patient Name: Date of Service: Cheryl Delgado, Cheryl B. 03/23/2021 10:15 A M Medical Record Number: [id number] Patient Account Number: [id number] Date of Birth/Sex: Treating RN: 12-14-1945 (75 y.o. Elam Dutch Primary Care Provider: Pricilla Holm Other Clinician: Referring Provider: Treating Provider/Extender: Verlene Mayer in Treatment: 1 Subjective Chief Complaint Information obtained from Patient Left lower extremity wound History of Present Illness (HPI) Admission 03/16/2021 Ms. Shery Wauneka is a 75 year old female with a past medical history of uncontrolled type 2 diabetes on oral agents with last hemoglobin A1c of 9, essential hypertension, chronic venous insufficiency and tobacco dependence that presents to the clinic for a 1 month history of left lower extremity wound that waxes and wanes. She was hospitalized on 03/02/2021 for left lower extremity cellulitis. She was treated with IV antibiotics and discharged with oral medication. She is currently not taking any antibiotics at this time. She reports improvement in her symptoms but continues to develop wounds to her lower extremity. She uses compression stockings daily. She currently denies signs of infection. 03/23/2021; patient presents for follow-up. She tolerated the compression wrap well. She has no issues or complaints today. Patient History Family History Diabetes - Mother, Heart Disease - Mother, No family history of Cancer, Hereditary Spherocytosis, Hypertension, Kidney Disease, Lung Disease, Seizures, Stroke, Thyroid Problems, Tuberculosis. Social History Current every day smoker - 6 cig a day, Marital Status - Married, Alcohol Use - Never, Drug Use - No History, Caffeine Use - Moderate - coffee. Medical History Eyes Denies history of Cataracts, Glaucoma, Optic  Neuritis Ear/Nose/Mouth/Throat Denies history of Chronic sinus problems/congestion, Middle ear problems Hematologic/Lymphatic Denies history of Anemia, Hemophilia, Human Immunodeficiency Virus, Lymphedema, Sickle Cell Disease Respiratory Denies history of Aspiration, Asthma, Chronic Obstructive Pulmonary Disease (COPD), Pneumothorax, Sleep Apnea, Tuberculosis Cardiovascular Patient has history of Hypertension Denies history of Angina, Arrhythmia, Congestive Heart Failure, Coronary Artery Disease, Deep Vein Thrombosis, Hypotension, Myocardial Infarction, Peripheral Arterial Disease, Peripheral Venous Disease, Phlebitis, Vasculitis Gastrointestinal Denies history of Cirrhosis , Colitis, Crohnoos, Hepatitis A, Hepatitis B, Hepatitis C Endocrine Patient has history of Type II Diabetes Denies history of Type I Diabetes Genitourinary Denies history of End Stage Renal Disease Immunological Denies history of Lupus Erythematosus, Raynaudoos, Scleroderma Integumentary (Skin) Denies history of History of Burn Musculoskeletal Denies history of Gout, Rheumatoid Arthritis, Osteoarthritis, Osteomyelitis Neurologic Denies history of Dementia, Neuropathy, Quadriplegia, Paraplegia, Seizure Disorder Oncologic Denies history of Received Chemotherapy, Received Radiation Psychiatric Denies history of Anorexia/bulimia, Confinement Anxiety Hospitalization/Surgery History - 03/02/2021-03/05/2021 Cellulitis. - hysterectomy. Medical A Surgical History Notes nd Constitutional Symptoms (  General Health) Cellulitis x4 weeks ago failed oral abx therapy, inpatient for IV 03/02/2021-03/05/2021 Eyes retinopathy right eye Objective Constitutional respirations regular, non-labored and within target range for patient.. Vitals Time Taken: 10:44 AM, Height: 70 in, Source: Stated, Weight: 213 lbs, Source: Stated, BMI: 30.6, Temperature: 98.4 F, Pulse: 80 bpm, Respiratory Rate: 18 breaths/min, Blood Pressure:  137/81 mmHg. Cardiovascular 2+ dorsalis pedis/posterior tibialis pulses. Psychiatric pleasant and cooperative. General Notes: Left lower extremity with epithelialization to the previous wound site. Good edema control. Surrounding skin is intact. Integumentary (Hair, Skin) Wound #1 status is Healed - Epithelialized. Original cause of wound was Not Known. The date acquired was: 02/23/2021. The wound has been in treatment 1 weeks. The wound is located on the Left,Anterior Lower Leg. The wound measures 0cm length x 0cm width x 0cm depth; 0cm^2 area and 0cm^3 volume. There is Fat Layer (Subcutaneous Tissue) exposed. There is no tunneling or undermining noted. There is a none present amount of drainage noted. There is no granulation within the wound bed. There is no necrotic tissue within the wound bed. Assessment Active Problems ICD-10 Chronic venous hypertension (idiopathic) with ulcer of left lower extremity Non-pressure chronic ulcer of other part of left lower leg with fat layer exposed Type 2 diabetes mellitus with other skin ulcer Lymphedema, not elsewhere classified Nicotine dependence, other tobacco product, uncomplicated Patient did well with silver alginate and compression therapy. The wound is healed. At this time I recommended compression stockings daily. We gave her information to elastic therapy if she chooses to obtain new ones. She states she has some at home. She knows to call with any questions or concerns and can follow-up as needed. Plan Discharge From [facility]: Discharge from [place] Bathing/ Shower/ Hygiene: May shower and wash wound with soap and water. Edema Control - Lymphedema / SCD / Other: Elevate legs to the level of the heart or above for 30 minutes daily and/or when sitting, a frequency of: - 3-4 times a day throughout the day. Avoid standing for long periods of time. Exercise regularly Moisturize legs daily. - both legs every night before bed  after removing stockings Compression stocking or Garment 20-30 mm/Hg pressure to: - both legs daily Additional Orders / Instructions: Stop/Decrease Smoking Follow Nutritious Diet - work on lowering blood glucose. Home Health: [facility] for wound care. - wound is healed Other Home Health Orders/Instructions: - [place] 1. Compression stockings daily 2. Discharge from clinic due to closed wound 3. Follow-up as needed Electronic Signature(s) Signed: 03/23/2021 11:57:49 AM By: Kalman Shan DO Entered By: Kalman Shan on 03/23/2021 11:57:14 -------------------------------------------------------------------------------- HxROS Details Patient Name: Date of Service: Cheryl Delgado, Cheryl B. 03/23/2021 10:15 A M Medical Record Number: [id number] Patient Account Number: [id number] Date of Birth/Sex: Treating RN: 1946/04/11 (75 y.o. Elam Dutch Primary Care Provider: Pricilla Holm Other Clinician: Referring Provider: Treating Provider/Extender: Verlene Mayer in Treatment: 1 Constitutional Symptoms (General Health) Medical History: Past Medical History Notes: Cellulitis x4 weeks ago failed oral abx therapy, inpatient for IV 03/02/2021-03/05/2021 Eyes Medical History: Negative for: Cataracts; Glaucoma; Optic Neuritis Past Medical History Notes: retinopathy right eye Ear/Nose/Mouth/Throat Medical History: Negative for: Chronic sinus problems/congestion; Middle ear problems Hematologic/Lymphatic Medical History: Negative for: Anemia; Hemophilia; Human Immunodeficiency Virus; Lymphedema; Sickle Cell Disease Respiratory Medical History: Negative for: Aspiration; Asthma; Chronic Obstructive Pulmonary Disease (COPD); Pneumothorax; Sleep Apnea; Tuberculosis Cardiovascular Medical History: Positive for: Hypertension Negative for: Angina; Arrhythmia; Congestive Heart Failure; Coronary Artery Disease; Deep Vein Thrombosis; Hypotension;  Myocardial Infarction; Peripheral Arterial Disease; Peripheral Venous Disease; Phlebitis; Vasculitis Gastrointestinal Medical History: Negative for: Cirrhosis ; Colitis; Crohns; Hepatitis A; Hepatitis B; Hepatitis C Endocrine Medical History: Positive for: Type II Diabetes Negative for: Type I Diabetes Time with diabetes: 75 years Treated with: Oral agents, Diet Genitourinary Medical History: Negative for: End Stage Renal Disease Immunological Medical History: Negative for: Lupus Erythematosus; Raynauds; Scleroderma Integumentary (Skin) Medical History: Negative for: History of Burn Musculoskeletal Medical History: Negative for: Gout; Rheumatoid Arthritis; Osteoarthritis; Osteomyelitis Neurologic Medical History: Negative for: Dementia; Neuropathy; Quadriplegia; Paraplegia; Seizure Disorder Oncologic Medical History: Negative for: Received Chemotherapy; Received Radiation Psychiatric Medical History: Negative for: Anorexia/bulimia; Confinement Anxiety Immunizations Pneumococcal Vaccine: Received Pneumococcal Vaccination: Yes Received Pneumococcal Vaccination On or After 60th Birthday: Yes Implantable Devices No devices added Hospitalization / Surgery History Type of Hospitalization/Surgery 03/02/2021-03/05/2021 Cellulitis hysterectomy Family and Social History Cancer: No; Diabetes: Yes - Mother; Heart Disease: Yes - Mother; Hereditary Spherocytosis: No; Hypertension: No; Kidney Disease: No; Lung Disease: No; Seizures: No; Stroke: No; Thyroid Problems: No; Tuberculosis: No; Current every day smoker - 6 cig a day; Marital Status - Married; Alcohol Use: Never; Drug Use: No History; Caffeine Use: Moderate - coffee; Financial Concerns: No; Food, Clothing or Shelter Needs: No; Support System Lacking: No; Transportation Concerns: No Electronic Signature(s) Signed: 03/23/2021 11:57:49 AM By: Kalman Shan DO Signed: 03/23/2021 12:28:25 PM By: Baruch Gouty RN, BSN Entered  By: Kalman Shan on 03/23/2021 11:52:55 -------------------------------------------------------------------------------- [place] Details Patient Name: Date of Service: Cheryl Delgado, Cheryl B. 03/23/2021 Medical Record Number: [id number] Patient Account Number: [id number] Date of Birth/Sex: Treating RN: 1945-12-09 (75 y.o. Elam Dutch Primary Care Provider: Pricilla Holm Other Clinician: Referring Provider: Treating Provider/Extender: Verlene Mayer in Treatment: 1 Diagnosis Coding ICD-10 Codes Code Description [phone] Chronic venous hypertension (idiopathic) with ulcer of left lower extremity L97.822 Non-pressure chronic ulcer of other part of left lower leg with fat layer exposed E11.622 Type 2 diabetes mellitus with other skin ulcer I89.0 Lymphedema, not elsewhere classified F17.290 Nicotine dependence, other tobacco product, uncomplicated Facility Procedures CPT4 Code: 24825003 Description: 99213 - WOUND CARE VISIT-LEV 3 EST PT Modifier: Quantity: 1 Physician Procedures : CPT4 Code Description Modifier [id number] - WC PHYS LEVEL 3 - EST PT ICD-10 Diagnosis Description I87.312 Chronic venous hypertension (idiopathic) with ulcer of left lower extremity L97.822 Non-pressure chronic ulcer of other part of left lower leg  with fat layer exposed E11.622 Type 2 diabetes mellitus with other skin ulcer Quantity: 1 Electronic Signature(s) Signed: 03/23/2021 11:57:49 AM By: Kalman Shan DO Entered By: Kalman Shan on 03/23/2021 11:57:30

## 2021-03-30 ENCOUNTER — Telehealth: Payer: Self-pay

## 2021-03-30 MED ORDER — FLUCONAZOLE 150 MG PO TABS: 150.0000 mg | ORAL_TABLET | ORAL | 0 refills | Status: DC

## 2021-03-30 NOTE — Telephone Encounter (Signed)
Refilled diflucan, take 1 pill today and then 1 pill Monday.

## 2021-03-30 NOTE — Telephone Encounter (Signed)
Patient recently prescribed many antibiotics and has seen gained a yeast infection. Patient is request a medication for this matter.   She states she has finished all the medication given but the yeast infection has not gone away.   [facility] on [address]

## 2021-03-30 NOTE — Telephone Encounter (Signed)
See below

## 2021-04-10 ENCOUNTER — Other Ambulatory Visit: Payer: Self-pay | Admitting: Internal Medicine

## 2021-06-07 ENCOUNTER — Telehealth: Payer: Self-pay | Admitting: Internal Medicine

## 2021-06-07 NOTE — Telephone Encounter (Signed)
1.Medication Requested: JARDIANCE 25 MG TABS tablet  metFORMIN (GLUCOPHAGE) 1000 MG tablet   2. Pharmacy (Name, Street, [place]): [place], [address]  3. On Med List: yes   4. Last Visit with PCP: 03-12-2021  5. Next visit date with PCP: n/a   Agent: Please be advised that RX refills may take up to 3 business days. We ask that you follow-up with your pharmacy.

## 2021-06-07 NOTE — Telephone Encounter (Signed)
1.Medication Requested: rosuvastatin (CRESTOR) 10 MG tablet  2. Pharmacy (Name, Street, [place]): [place], [address]  3. On Med List: yes   4. Last Visit with PCP: 03-12-2021  5. Next visit date with PCP: n/a  Pt states she is out of medication

## 2021-06-08 MED ORDER — EMPAGLIFLOZIN 25 MG PO TABS: 25.0000 mg | ORAL_TABLET | Freq: Every day | ORAL | 0 refills | Status: DC

## 2021-06-08 MED ORDER — ROSUVASTATIN CALCIUM 10 MG PO TABS: 10.0000 mg | ORAL_TABLET | Freq: Every day | ORAL | 0 refills | Status: DC

## 2021-06-08 NOTE — Telephone Encounter (Signed)
Refills have been sent to the pt's pharmacy  

## 2021-06-13 ENCOUNTER — Other Ambulatory Visit: Payer: Self-pay | Admitting: Internal Medicine

## 2021-08-07 ENCOUNTER — Telehealth: Payer: Self-pay | Admitting: Internal Medicine

## 2021-08-07 NOTE — Telephone Encounter (Signed)
LVM for pt to rtn my call to schedule AWV with [facility]. Please schedule AWV with [facility] if pt calls the office.  ? ?

## 2021-08-11 ENCOUNTER — Other Ambulatory Visit: Payer: Self-pay | Admitting: Internal Medicine

## 2021-09-07 ENCOUNTER — Other Ambulatory Visit: Payer: Self-pay | Admitting: Internal Medicine

## 2021-09-12 ENCOUNTER — Other Ambulatory Visit: Payer: Self-pay | Admitting: Internal Medicine

## 2021-09-19 ENCOUNTER — Other Ambulatory Visit: Payer: Self-pay | Admitting: Internal Medicine

## 2021-10-15 ENCOUNTER — Other Ambulatory Visit: Payer: Self-pay | Admitting: Internal Medicine

## 2021-10-18 IMAGING — MG DIGITAL SCREENING BILAT W/ TOMO W/ CAD
8 series · 8 of 24 positions shown · non-contrast
Comparison: Previous exam(s).

CLINICAL DATA: Screening.

EXAM:
DIGITAL SCREENING BILATERAL MAMMOGRAM WITH TOMO AND CAD

[L CC synth-2D]
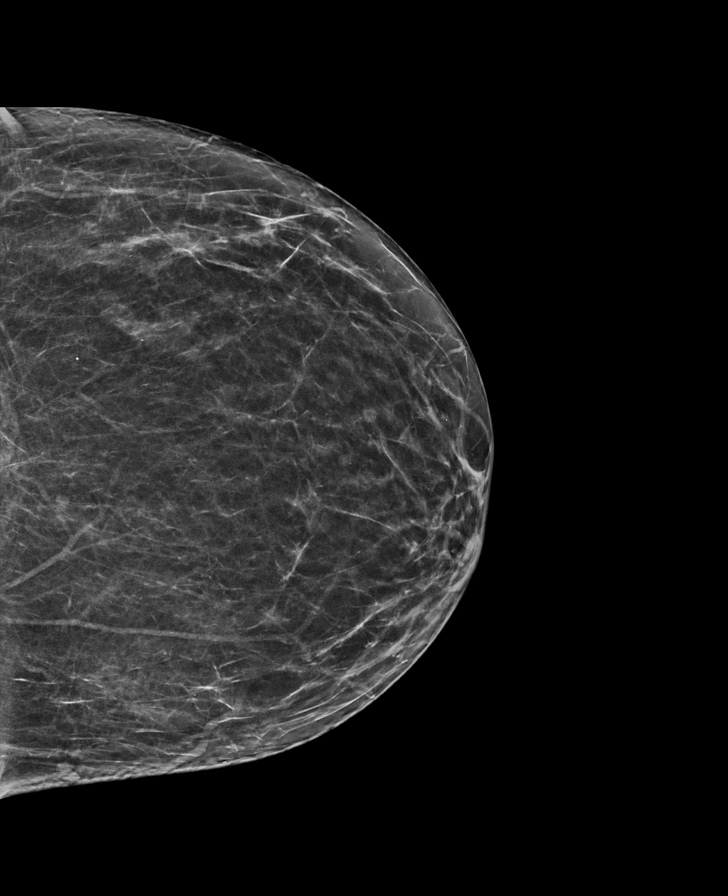

[R CC synth-2D]
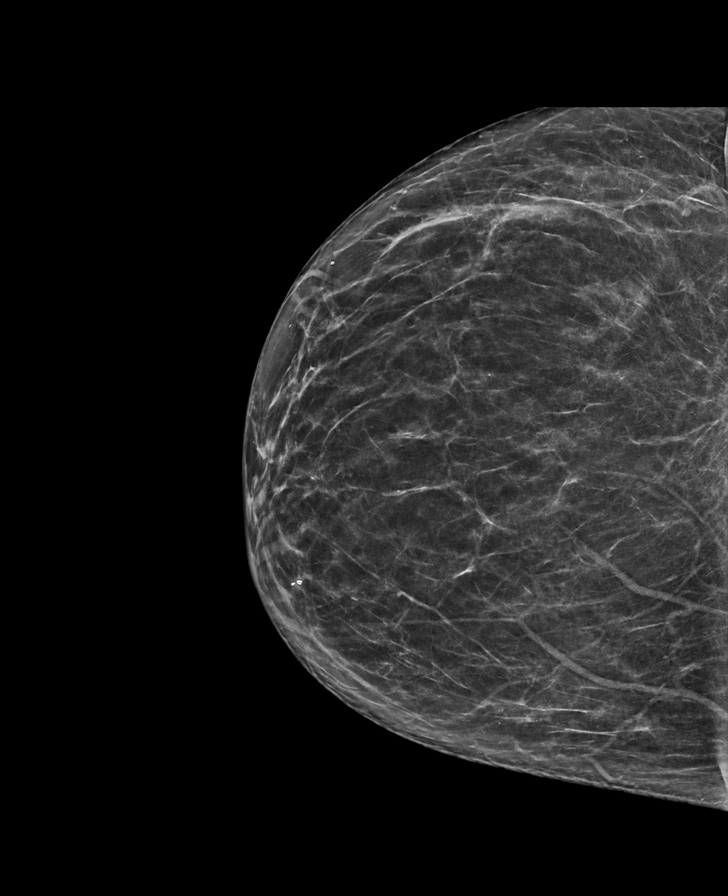

[L MLO synth-2D]
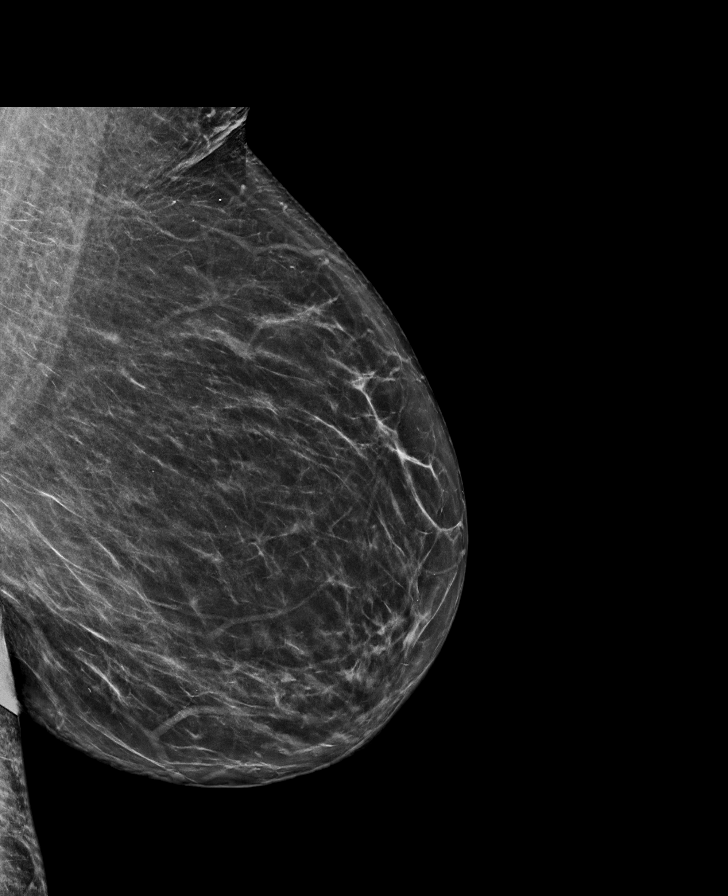

[R MLO synth-2D]
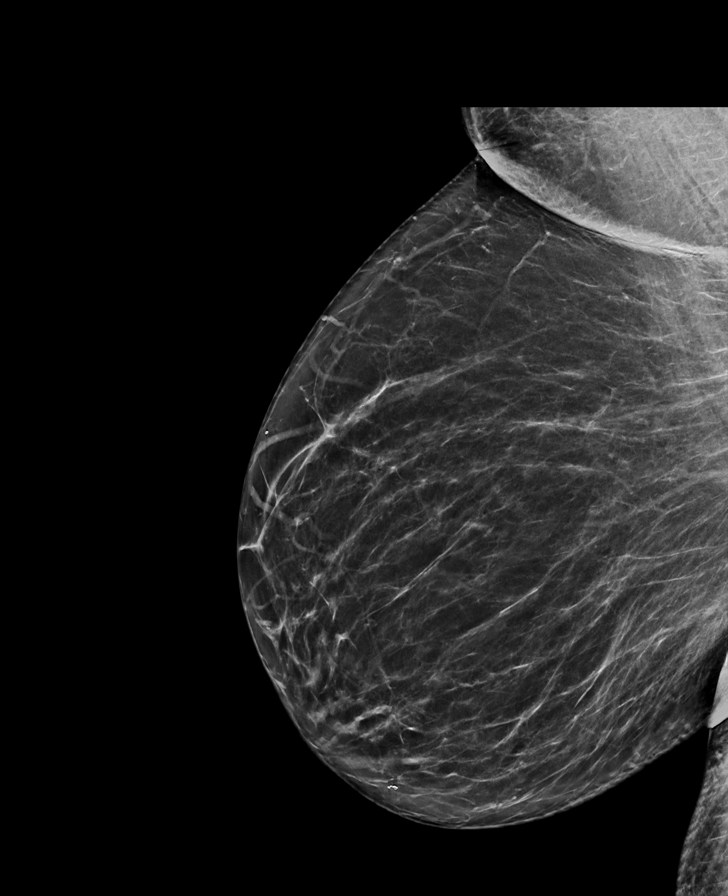

[L CC tomo · tomo slice 32/63.0]
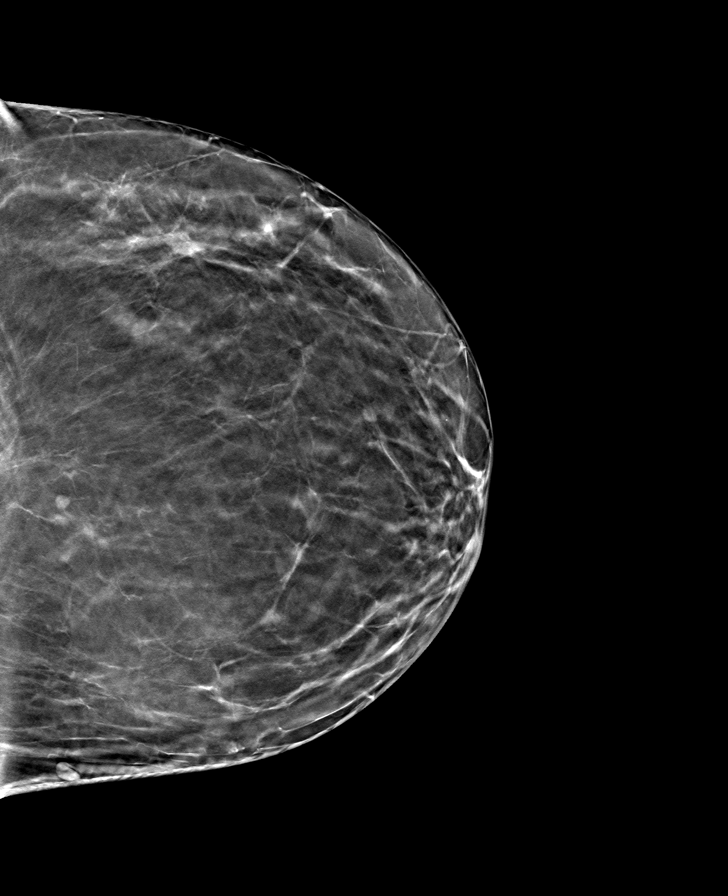

[R CC tomo · tomo slice 33/66.0]
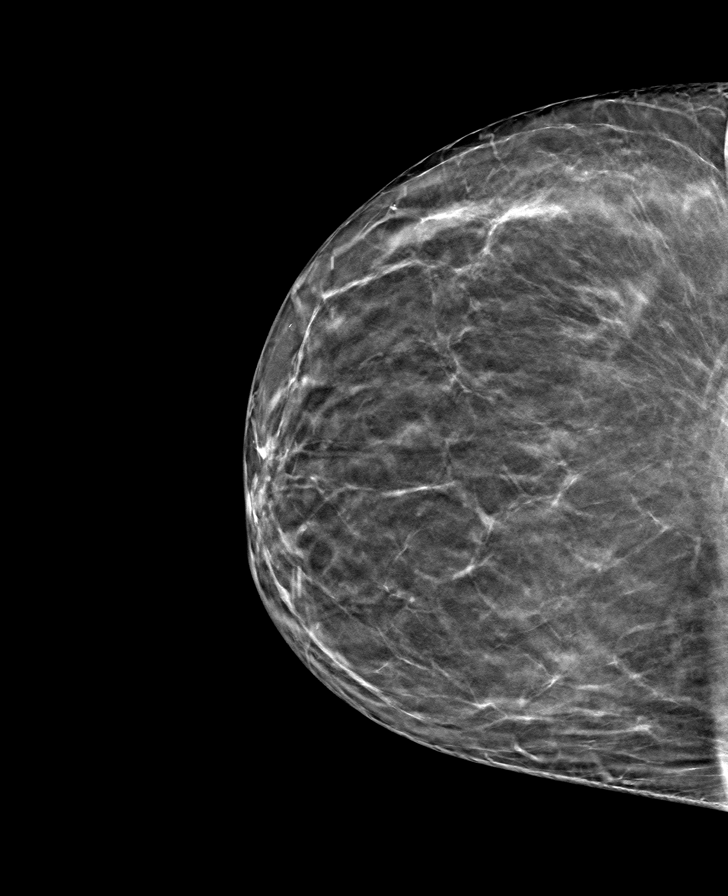

[R MLO tomo · tomo slice 39/76.0]
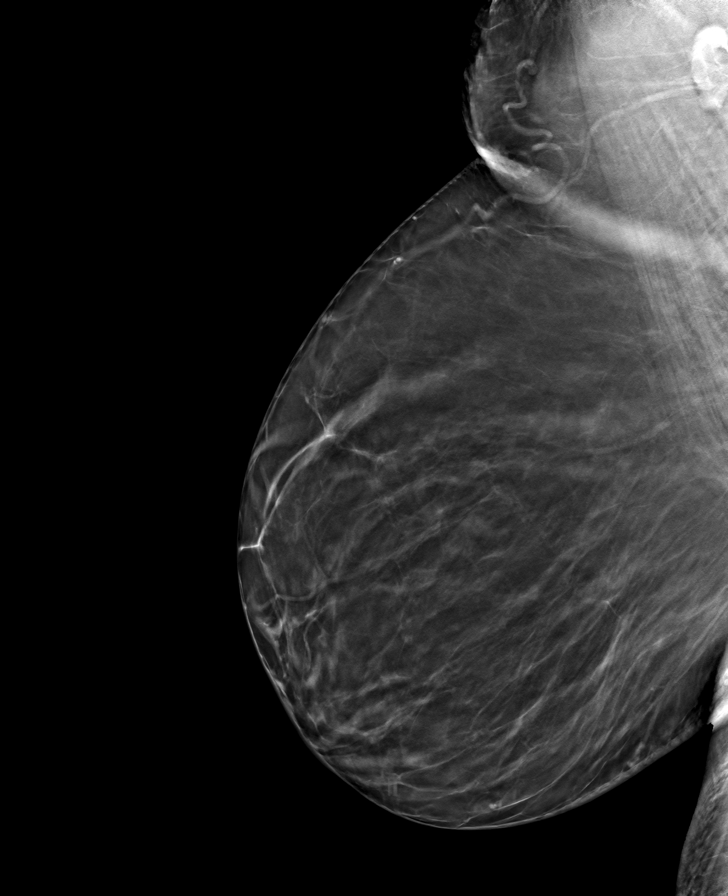

[L MLO tomo · tomo slice 38/75.0]
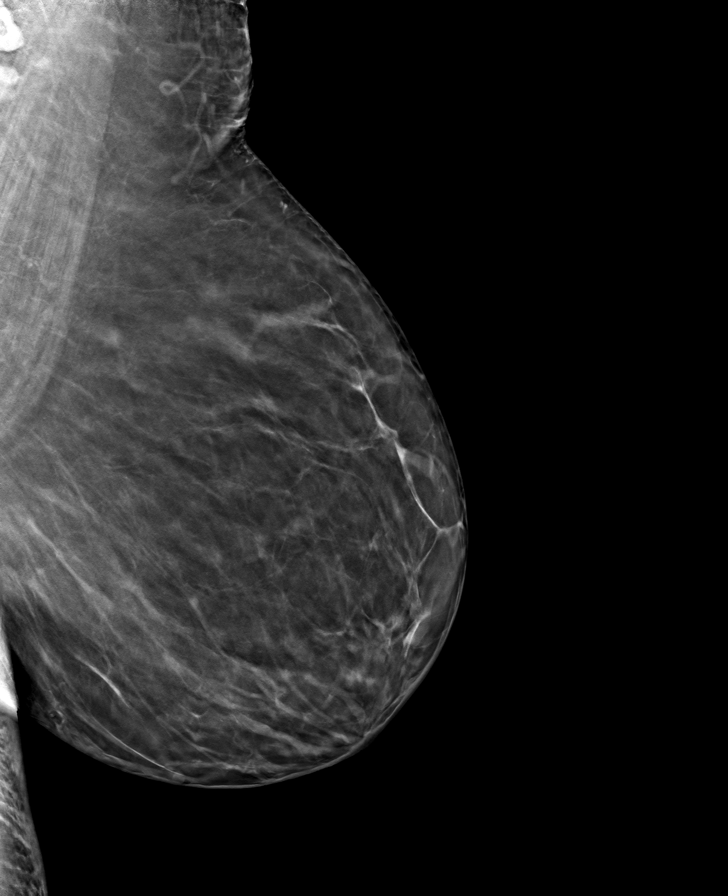

[8 of 24 positions shown; findings below may reference images not displayed]

ACR Breast Density Category b: There are scattered areas of
fibroglandular density.
FINDINGS: There are no findings suspicious for malignancy. Images were
processed with CAD.
IMPRESSION: No mammographic evidence of malignancy. A result letter of this
screening mammogram will be mailed directly to the patient.

RECOMMENDATION:
Screening mammogram in one year. (Code:[id number])

BI-RADS CATEGORY  1: Negative.

## 2021-10-22 ENCOUNTER — Telehealth: Payer: Self-pay | Admitting: Internal Medicine

## 2021-10-22 NOTE — Telephone Encounter (Signed)
Left message for patient to call back to schedule Medicare Annual Wellness Visit   Last AWV  07/20/20  Please schedule at anytime with [facility] if patient calls the office back.      Any questions, please call me at [phone]

## 2021-12-07 ENCOUNTER — Encounter: Payer: Self-pay | Admitting: Internal Medicine

## 2021-12-07 ENCOUNTER — Ambulatory Visit (INDEPENDENT_AMBULATORY_CARE_PROVIDER_SITE_OTHER): Payer: Medicare HMO

## 2021-12-07 ENCOUNTER — Other Ambulatory Visit: Payer: Self-pay

## 2021-12-07 ENCOUNTER — Ambulatory Visit (INDEPENDENT_AMBULATORY_CARE_PROVIDER_SITE_OTHER): Payer: Medicare HMO | Admitting: Internal Medicine

## 2021-12-07 VITALS — BP 134/62 | HR 80 | Temp 97.9°F | Ht 62.0 in | Wt 224.8 lb

## 2021-12-07 DIAGNOSIS — M79604 Pain in right leg: Secondary | ICD-10-CM | POA: Diagnosis not present

## 2021-12-07 DIAGNOSIS — Z0001 Encounter for general adult medical examination with abnormal findings: Secondary | ICD-10-CM | POA: Diagnosis not present

## 2021-12-07 DIAGNOSIS — F17210 Nicotine dependence, cigarettes, uncomplicated: Secondary | ICD-10-CM

## 2021-12-07 DIAGNOSIS — Z1211 Encounter for screening for malignant neoplasm of colon: Secondary | ICD-10-CM

## 2021-12-07 DIAGNOSIS — E118 Type 2 diabetes mellitus with unspecified complications: Secondary | ICD-10-CM

## 2021-12-07 DIAGNOSIS — M79605 Pain in left leg: Secondary | ICD-10-CM | POA: Diagnosis not present

## 2021-12-07 DIAGNOSIS — E785 Hyperlipidemia, unspecified: Secondary | ICD-10-CM | POA: Diagnosis not present

## 2021-12-07 DIAGNOSIS — E1169 Type 2 diabetes mellitus with other specified complication: Secondary | ICD-10-CM

## 2021-12-07 DIAGNOSIS — I872 Venous insufficiency (chronic) (peripheral): Secondary | ICD-10-CM

## 2021-12-07 DIAGNOSIS — I10 Essential (primary) hypertension: Secondary | ICD-10-CM

## 2021-12-07 LAB — HEMOGLOBIN A1C: Hgb A1c MFr Bld: 7.9 % — ABNORMAL HIGH (ref 4.6–6.5)

## 2021-12-07 LAB — COMPREHENSIVE METABOLIC PANEL
ALT: 9 U/L (ref 0–35)
AST: 14 U/L (ref 0–37)
Albumin: 4 g/dL (ref 3.5–5.2)
Alkaline Phosphatase: 60 U/L (ref 39–117)
BUN: 28 mg/dL — ABNORMAL HIGH (ref 6–23)
CO2: 34 mEq/L — ABNORMAL HIGH (ref 19–32)
Calcium: 10 mg/dL (ref 8.4–10.5)
Chloride: 102 mEq/L (ref 96–112)
Creatinine, Ser: 0.93 mg/dL (ref 0.40–1.20)
GFR: 60.18 mL/min (ref >60.00)
Glucose, Bld: 132 mg/dL — ABNORMAL HIGH (ref 70–99)
Potassium: 4.5 mEq/L (ref 3.5–5.1)
Sodium: 141 mEq/L (ref 135–145)
Total Bilirubin: 0.3 mg/dL (ref 0.2–1.2)
Total Protein: 7.3 g/dL (ref 6.0–8.3)

## 2021-12-07 LAB — LIPID PANEL
Cholesterol: 138 mg/dL (ref 0–200)
HDL: 71 mg/dL (ref >39.00)
LDL Cholesterol: 49 mg/dL (ref 0–99)
NonHDL: 66.53
Total CHOL/HDL Ratio: 2
Triglycerides: 86 mg/dL (ref 0.0–149.0)
VLDL: 17.2 mg/dL (ref 0.0–40.0)

## 2021-12-07 LAB — CBC
HCT: 35.5 % — ABNORMAL LOW (ref 36.0–46.0)
Hemoglobin: 11.3 g/dL — ABNORMAL LOW (ref 12.0–15.0)
MCHC: 31.7 g/dL (ref 30.0–36.0)
MCV: 80.5 fl (ref 78.0–100.0)
Platelets: 167 10*3/uL (ref 150.0–400.0)
RBC: 4.41 Mil/uL (ref 3.87–5.11)
RDW: 18.6 % — ABNORMAL HIGH (ref 11.5–15.5)
WBC: 6.1 10*3/uL (ref 4.0–10.5)

## 2021-12-07 LAB — MICROALBUMIN / CREATININE URINE RATIO
Creatinine,U: 56.1 mg/dL
Microalb Creat Ratio: 8.3 mg/g (ref 0.0–30.0)
Microalb, Ur: 4.6 mg/dL — ABNORMAL HIGH (ref 0.0–1.9)

## 2021-12-07 MED ORDER — ROSUVASTATIN CALCIUM 10 MG PO TABS: ORAL_TABLET | ORAL | 3 refills | Status: DC

## 2021-12-07 MED ORDER — TRIAMCINOLONE ACETONIDE 0.1 % EX CREA: 1.0000 | TOPICAL_CREAM | Freq: Two times a day (BID) | CUTANEOUS | 0 refills | Status: DC

## 2021-12-07 NOTE — Assessment & Plan Note (Signed)
Flu shot yearly. Covid-19 counseled. Pneumonia up to date. Shingrix due at pharmacy. Tetanus due at pharmacy. Cologuard ordered. Mammogram up to date, pap smear aged out and dexa complete. Counseled about sun safety and mole surveillance. Counseled about the dangers of distracted driving. Given 10 year screening recommendations.

## 2021-12-07 NOTE — Assessment & Plan Note (Signed)
Counseled to quit and unable to make attempt at this time.

## 2021-12-07 NOTE — Assessment & Plan Note (Signed)
Weight stable. Counseled about diet and exercise.

## 2021-12-07 NOTE — Assessment & Plan Note (Signed)
Overall stable, taking torsemide 20 mg daily and checking CMP for stability.

## 2021-12-07 NOTE — Assessment & Plan Note (Signed)
Foot exam done, eye exam up to date. Checking HgA1c, microalbumin to creatinine ratio and CMP and lipid panel. Adjust jardiance 25 mg daily as needed and actos 45 mg daily and amaryl 4 mg daily and metformin 1000 mg BID. Taking statin and ARB.

## 2021-12-07 NOTE — Patient Instructions (Signed)
We are checking the labs today and the x-ray of the back. If normal we will try an anti-inflammatory medicine to help the pain.

## 2021-12-07 NOTE — Progress Notes (Signed)
Subjective:   Patient ID: Cheryl Delgado, female    DOB: 01/23/1946, 76 y.o.   MRN: [id number]  HPI Here for medicare wellness and physical, no new complaints. Please see A/P for status and treatment of chronic medical problems.   Diet: DM since diabetic Physical activity: sedentary Depression/mood screen: negative Hearing: intact to whispered voice Visual acuity: grossly normal, with lens performs annual eye exam  ADLs: capable Fall risk: low Home safety: good Cognitive evaluation: intact to orientation, naming, recall and repetition EOL planning: adv directives discussed, not in place  [employer] Visit from 12/07/2021 in [place] [facility] Total Score 0           03/03/2021   11:00 PM 03/04/2021    8:07 AM 03/04/2021   10:00 PM 03/05/2021    8:00 AM 12/07/2021    2:05 PM  [place] in the past year?     1  Was there an injury with Fall?     0  Fall Risk Category Calculator     2  Fall Risk Category     Moderate  Patient Fall Risk Level Moderate fall risk Moderate fall risk Moderate fall risk Moderate fall risk Moderate fall risk  Patient at Risk for Falls Due to     History of fall(s)  Fall risk Follow up     Falls evaluation completed    I have personally reviewed and have noted 1. The patient's medical and social history - reviewed today no changes 2. Their use of alcohol, tobacco or illicit drugs 3. Their current medications and supplements 4. The patient's functional ability including ADL's, fall risks, home safety risks and hearing or visual impairment. 5. Diet and physical activities 6. Evidence for depression or mood disorders 7. Care team reviewed and updated 8.  The patient is not on an opioid pain medication.  Patient Care Team: Hoyt Koch, MD as PCP - General (Internal Medicine) Prentiss Bells, MD (Ophthalmology) Past Medical History:  Diagnosis Date   Chest pain, unspecified    Edema     peripheral   Hypertension    Type II or unspecified type diabetes mellitus without mention of complication, not stated as uncontrolled    Vertigo    Past Surgical History:  Procedure Laterality Date   COLONOSCOPY  [id number]   dialtion and curettage     TOTAL ABDOMINAL HYSTERECTOMY W/ BILATERAL SALPINGOOPHORECTOMY     due to fibroid tumors and uterine bleeding   Family History  Problem Relation Age of Onset   Hyperlipidemia Mother    Diabetes Mother    Heart disease Mother    Breast cancer Neg Hx    Colon cancer Neg Hx    Stroke Neg Hx    Amblyopia Neg Hx    Blindness Neg Hx    Cataracts Neg Hx    Glaucoma Neg Hx    Macular degeneration Neg Hx    Retinal detachment Neg Hx    Strabismus Neg Hx    Retinitis pigmentosa Neg Hx    Review of Systems  Constitutional: Negative.   HENT: Negative.    Eyes: Negative.   Respiratory:  Negative for cough, chest tightness and shortness of breath.   Cardiovascular:  Positive for leg swelling. Negative for chest pain and palpitations.  Gastrointestinal:  Negative for abdominal distention, abdominal pain, constipation, diarrhea, nausea and vomiting.  Musculoskeletal: Negative.   Skin: Negative.   Neurological:  Positive for numbness.  Psychiatric/Behavioral: Negative.      Objective:  Physical Exam Constitutional:      Appearance: She is well-developed. She is obese.  HENT:     Head: Normocephalic and atraumatic.  Cardiovascular:     Rate and Rhythm: Normal rate and regular rhythm.  Pulmonary:     Effort: Pulmonary effort is normal. No respiratory distress.     Breath sounds: Normal breath sounds. No wheezing or rales.  Abdominal:     General: Bowel sounds are normal. There is no distension.     Palpations: Abdomen is soft.     Tenderness: There is no abdominal tenderness. There is no rebound.  Musculoskeletal:     Cervical back: Normal range of motion.     Right lower leg: Edema present.     Left lower leg: Edema present.   Skin:    General: Skin is warm and dry.     Comments: Foot exam done  Neurological:     Mental Status: She is alert and oriented to person, place, and time.     Coordination: Coordination normal.     Vitals:   12/07/21 1404  BP: 134/62  Pulse: 80  Temp: 97.9 F (36.6 C)  TempSrc: Oral  SpO2: 97%  Weight: 224 lb 12.8 oz (102 kg)  Height: '5\' 2"'$  (1.575 m)   Assessment & Plan:

## 2021-12-07 NOTE — Assessment & Plan Note (Signed)
BP at goal on losartan 50 mg daily and torsemide 20 mg daily. Checking CMP and adjust as needed.

## 2021-12-07 NOTE — Assessment & Plan Note (Signed)
Checking lipid panel and adjust crestor 10 mg daily as needed for LDL <100.

## 2021-12-07 NOTE — Assessment & Plan Note (Signed)
Checking x-ray lumbar to rule out disease. If normal will do 5 day course prednisone.

## 2021-12-10 ENCOUNTER — Other Ambulatory Visit: Payer: Self-pay | Admitting: Internal Medicine

## 2021-12-10 MED ORDER — PREDNISONE 20 MG PO TABS: 40.0000 mg | ORAL_TABLET | Freq: Every day | ORAL | 0 refills | Status: DC

## 2021-12-14 ENCOUNTER — Other Ambulatory Visit: Payer: Self-pay | Admitting: Internal Medicine

## 2021-12-26 ENCOUNTER — Other Ambulatory Visit: Payer: Self-pay | Admitting: Internal Medicine

## 2021-12-31 ENCOUNTER — Other Ambulatory Visit: Payer: Self-pay | Admitting: Internal Medicine

## 2022-01-04 NOTE — Telephone Encounter (Signed)
Patient states she is out of the torsemide and needs it sent in as soon as possible as her legs are swelling.  [place], [place]

## 2022-02-12 ENCOUNTER — Ambulatory Visit (INDEPENDENT_AMBULATORY_CARE_PROVIDER_SITE_OTHER): Payer: Medicare HMO | Admitting: Family Medicine

## 2022-02-12 ENCOUNTER — Encounter: Payer: Self-pay | Admitting: Family Medicine

## 2022-02-12 VITALS — BP 136/70 | HR 62 | Temp 97.5°F | Ht 62.0 in | Wt 226.0 lb

## 2022-02-12 DIAGNOSIS — I872 Venous insufficiency (chronic) (peripheral): Secondary | ICD-10-CM | POA: Diagnosis not present

## 2022-02-12 DIAGNOSIS — S90822A Blister (nonthermal), left foot, initial encounter: Secondary | ICD-10-CM

## 2022-02-12 DIAGNOSIS — Z23 Encounter for immunization: Secondary | ICD-10-CM

## 2022-02-12 DIAGNOSIS — R600 Localized edema: Secondary | ICD-10-CM

## 2022-02-12 DIAGNOSIS — E118 Type 2 diabetes mellitus with unspecified complications: Secondary | ICD-10-CM | POA: Diagnosis not present

## 2022-02-12 DIAGNOSIS — F17210 Nicotine dependence, cigarettes, uncomplicated: Secondary | ICD-10-CM | POA: Diagnosis not present

## 2022-02-12 NOTE — Progress Notes (Signed)
Subjective:     Patient ID: Cheryl Delgado, female    DOB: Apr 14, 1946, 76 y.o.   MRN: [id number]  Chief Complaint  Patient presents with   Foot Swelling    Bilateral foot swelling for about a week. States she does have some swelling usually but not this bad. Reports itching between her toes and blisters on her left foot.    HPI Patient is in today for a 1 wk hx of bilateral LE edema. Yesterday she notices blisters on her left foot.   Hx of cellulitis of LLE  Last Hgb A1c 7.9%   No fever, chills, N/V/D.   Health Maintenance Due  Topic Date Due   Zoster Vaccines- Shingrix (1 of 2) Never done   Fecal DNA (Cologuard)  07/04/2020    Past Medical History:  Diagnosis Date   Chest pain, unspecified    Edema    peripheral   Hypertension    Type II or unspecified type diabetes mellitus without mention of complication, not stated as uncontrolled    Vertigo     Past Surgical History:  Procedure Laterality Date   COLONOSCOPY  [id number]   dialtion and curettage     TOTAL ABDOMINAL HYSTERECTOMY W/ BILATERAL SALPINGOOPHORECTOMY     due to fibroid tumors and uterine bleeding    Family History  Problem Relation Age of Onset   Hyperlipidemia Mother    Diabetes Mother    Heart disease Mother    Breast cancer Neg Hx    Colon cancer Neg Hx    Stroke Neg Hx    Amblyopia Neg Hx    Blindness Neg Hx    Cataracts Neg Hx    Glaucoma Neg Hx    Macular degeneration Neg Hx    Retinal detachment Neg Hx    Strabismus Neg Hx    Retinitis pigmentosa Neg Hx     Social History   Socioeconomic History   Marital status: Married    Spouse name: Not on file   Number of children: 1   Years of education: 15   Highest education level: Not on file  Occupational History   Occupation: retired    Comment: Chiropodist  Tobacco Use   Smoking status: Every Day    Packs/day: 0.50    Years: 30.00    Total pack years: 15.00    Types: Cigarettes   Smokeless tobacco: Never    Tobacco comments:    pt states she has smoked for a long time  Vaping Use   Vaping Use: Never used  Substance and Sexual Activity   Alcohol use: No   Drug use: No   Sexual activity: Yes    Partners: Male  Other Topics Concern   Not on file  Social History Narrative   HSG, 3 years college. Married 1971. 1 son - '78.  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     empagliflozin (JARDIANCE) 25 MG  TABS tablet TAKE 1 TABLET(25 MG) BY MOUTH DAILY 90 tablet 3   glimepiride (AMARYL) 4 MG tablet TAKE 1 TABLET(4 MG) BY MOUTH DAILY WITH BREAKFAST 90 tablet 1   losartan (COZAAR) 50 MG tablet TAKE 1 TABLET(50 MG) BY MOUTH DAILY 90 tablet 1   metFORMIN (GLUCOPHAGE) 1000 MG tablet TAKE 1 TABLET(1000 MG) BY MOUTH TWICE DAILY WITH A MEAL 180 tablet 0   pioglitazone (ACTOS) 45 MG tablet Take 1 tablet (45 mg total) by mouth daily. 90 tablet 3   predniSONE (DELTASONE) 20 MG tablet Take 2 tablets (40 mg total) by mouth daily with breakfast. 10 tablet 0   rosuvastatin (CRESTOR) 10 MG tablet TAKE 1 TABLET(10 MG) BY MOUTH DAILY 90 tablet 3   sennosides-docusate sodium (SENOKOT-S) 8.6-50 MG tablet Take 1 tablet by mouth 2 (two) times daily as needed for constipation. 60 tablet 0   torsemide (DEMADEX) 20 MG tablet TAKE 1 TABLET(20 MG) BY MOUTH DAILY 90 tablet 0   triamcinolone cream (KENALOG) 0.1 % Apply 1 Application topically 2 (two) times daily. 100 g 0   No  facility-administered medications prior to visit.    No Known Allergies  ROS     Objective:    Physical Exam Constitutional:      General: She is not in acute distress. HENT:     Mouth/Throat:     Mouth: Mucous membranes are moist.  Eyes:     Conjunctiva/sclera: Conjunctivae normal.  Cardiovascular:     Rate and Rhythm: Normal rate.  Pulmonary:     Effort: Pulmonary effort is normal.  Musculoskeletal:     Right lower leg: 1+ Edema present.     Left lower leg: 1+ Edema present.     Right foot: Swelling present.     Left foot: Swelling present.     Comments: Large blisters on medial and lateral left foot. Sensation, cap refill and pulses present. No sign of cellulitis.   Skin:    General: Skin is warm and dry.     Findings: No erythema.  Neurological:     General: No focal deficit present.     Mental Status: She is alert.     Cranial Nerves: No cranial nerve deficit.     Sensory: No sensory deficit.     Motor: No weakness.  Psychiatric:        Mood and Affect: Mood normal.        Behavior: Behavior normal.     BP 136/70 (BP Location: Left Arm, Patient Position: Sitting, Cuff Size: Large)   Pulse 62   Temp (!) 97.5 F (36.4 C) (Temporal)   Ht '5\' 2"'$  (1.575 m)   Wt 226 lb (102.5 kg)   BMI 41.34 kg/m  Wt Readings from Last 3 Encounters:  02/15/22 223 lb 4 oz (101.3 kg)  02/12/22 226 lb (102.5 kg)  12/07/21 224 lb 12.8 oz (102 kg)       Assessment & Plan:   Problem List Items Addressed This Visit       Cardiovascular and Mediastinum   Chronic venous insufficiency (Chronic)   Relevant Orders   AMB referral to wound care center     Endocrine   Diabetes mellitus type 2 with complications ([place]) (Chronic)   Relevant Orders   AMB referral to wound care center     Other   Smoking 1/2 pack a day or less   Other Visit Diagnoses     Bilateral leg edema    -  Primary  Relevant Orders   AMB referral to wound care center   Need for influenza vaccination        Relevant Orders   Flu Vaccine QUAD High Dose(Fluad) (Completed)   Blister of left foot, initial encounter          Reviewed previous ED visit regarding cellulites and PCP notes as well.  No red flag symptoms today but I am concerned that blisters may rupture and increase infection risk. Diabetes fairly well controlled. She does still smoke. Chronic venous insufficiency. Referral to wound care center.  Counseling on elevating feet when sitting, low sodium diet, stay hydrated and increase torsemide from 20 mg to 40 mg daily for the next week.   I am having Orbie Hurst maintain her pioglitazone, sennosides-docusate sodium, acetaminophen, glimepiride, losartan, rosuvastatin, triamcinolone cream, predniSONE, Jardiance, metFORMIN, and torsemide.  No orders of the defined types were placed in this encounter.

## 2022-02-12 NOTE — Patient Instructions (Signed)
Make sure you are elevating your feet.  Limit your sodium intake and stay hydrated.  Double up on your torsemide, take 40 mg once daily for the next week and then go back to your usual 20 mg dose.  I placed an urgent referral to the wound care center.  You should hear from them in the next day or 2.  Please follow-up if you have not heard from them by Thursday afternoon.

## 2022-02-14 ENCOUNTER — Telehealth: Payer: Self-pay | Admitting: Internal Medicine

## 2022-02-14 NOTE — Telephone Encounter (Signed)
Patient called to report FIRST available appointment at wound care is 02/28/22.  Patient still has blisters and the referral is Urgent.  How would you like to proceed? Call pt at [phone]

## 2022-02-15 ENCOUNTER — Encounter: Payer: Self-pay | Admitting: Internal Medicine

## 2022-02-15 ENCOUNTER — Ambulatory Visit (INDEPENDENT_AMBULATORY_CARE_PROVIDER_SITE_OTHER): Payer: Medicare HMO | Admitting: Internal Medicine

## 2022-02-15 VITALS — BP 100/60 | HR 88 | Temp 98.0°F | Ht 62.0 in | Wt 223.2 lb

## 2022-02-15 DIAGNOSIS — L97421 Non-pressure chronic ulcer of left heel and midfoot limited to breakdown of skin: Secondary | ICD-10-CM

## 2022-02-15 DIAGNOSIS — E11621 Type 2 diabetes mellitus with foot ulcer: Secondary | ICD-10-CM | POA: Insufficient documentation

## 2022-02-15 DIAGNOSIS — E118 Type 2 diabetes mellitus with unspecified complications: Secondary | ICD-10-CM | POA: Diagnosis not present

## 2022-02-15 DIAGNOSIS — I872 Venous insufficiency (chronic) (peripheral): Secondary | ICD-10-CM | POA: Diagnosis not present

## 2022-02-15 MED ORDER — RYBELSUS 7 MG PO TABS: 7.0000 mg | ORAL_TABLET | Freq: Every day | ORAL | 0 refills | Status: DC

## 2022-02-15 MED ORDER — SULFAMETHOXAZOLE-TRIMETHOPRIM 800-160 MG PO TABS: 1.0000 | ORAL_TABLET | Freq: Two times a day (BID) | ORAL | 0 refills | Status: AC

## 2022-02-15 MED ORDER — SILVER SULFADIAZINE 1 % EX CREA: 1.0000 | TOPICAL_CREAM | Freq: Every day | CUTANEOUS | 0 refills | Status: DC

## 2022-02-15 NOTE — Assessment & Plan Note (Signed)
We will add rybelsus 7 mg daily to help with weight. Her HgA1c was 7.9 barely at goal last time. She is also taking metformin 1000 m BID and actos 45 mg daily and jardiance 25 mg daily. If she is able to afford we will plan to stop actos as this could be worsening the swelling in her legs.

## 2022-02-15 NOTE — Progress Notes (Signed)
   Subjective:   Patient ID: Cheryl Delgado, female    DOB: 1945-11-28, 76 y.o.   MRN: [id number]  HPI The patient is a 76 YO female coming in for swelling in legs, blisters, redness. Started doubling lasix about 4 days ago and has noticed some improvement in swelling. More redness in the foot.   Review of Systems  Constitutional: Negative.   HENT: Negative.    Eyes: Negative.   Respiratory:  Negative for cough, chest tightness and shortness of breath.   Cardiovascular:  Positive for leg swelling. Negative for chest pain and palpitations.  Gastrointestinal:  Negative for abdominal distention, abdominal pain, constipation, diarrhea, nausea and vomiting.  Musculoskeletal: Negative.   Skin:  Positive for color change and wound.  Neurological: Negative.   Psychiatric/Behavioral: Negative.      Objective:  Physical Exam Constitutional:      Appearance: She is well-developed. She is obese.  HENT:     Head: Normocephalic and atraumatic.  Cardiovascular:     Rate and Rhythm: Normal rate and regular rhythm.  Pulmonary:     Effort: Pulmonary effort is normal. No respiratory distress.     Breath sounds: Normal breath sounds. No wheezing or rales.  Abdominal:     General: Bowel sounds are normal. There is no distension.     Palpations: Abdomen is soft.     Tenderness: There is no abdominal tenderness. There is no rebound.  Musculoskeletal:     Cervical back: Normal range of motion.     Right lower leg: Edema present.     Left lower leg: Edema present.     Comments: Left 2+ edema to knee, Right 2+ edema less tight to the knee, Left foot with redness and warmth and wound midfoot from ruptured blister. There is fungus and maceration between most toes left and right. Left foot with unruptured large blister midfoot lateral and ruptured blister without skin breakdown plantar surface.  Skin:    General: Skin is warm and dry.  Neurological:     Mental Status: She is alert and oriented to person,  place, and time.     Coordination: Coordination normal.     Vitals:   02/15/22 1421  BP: 100/60  Pulse: 88  Temp: 98 F (36.7 C)  TempSrc: Oral  SpO2: 99%  Weight: 223 lb 4 oz (101.3 kg)  Height: '5\' 2"'$  (1.575 m)    Assessment & Plan:

## 2022-02-15 NOTE — Assessment & Plan Note (Signed)
Due to rupture of a blister she has skin breakdown. Rx silvadene ointment to use daily. She is also going to start bactrim 1 pill BID for 2 weeks due to concern for early cellulitis.

## 2022-02-15 NOTE — Assessment & Plan Note (Signed)
With concern for early cellulitis left leg and foot. Rx bactrim 2 week course. She has previously had cellulitis in this leg and was hospitalized. Wound clinic referral done previous and visit scheduled 02/28/22

## 2022-02-15 NOTE — Patient Instructions (Signed)
We have sent in bactrim to take 1 pill twice a day for 2 weeks.  We have sent in silvadene cream to use on the broken blister daily until you see wound care.  Keep taking the fluid pill at double dose until you see wound care.  If you notice this getting worse or not getting better call us or come back.

## 2022-02-20 NOTE — Telephone Encounter (Signed)
Patient seen 02/15/22 by provider.

## 2022-02-26 ENCOUNTER — Ambulatory Visit: Payer: Medicare HMO | Admitting: Internal Medicine

## 2022-02-28 ENCOUNTER — Encounter (HOSPITAL_BASED_OUTPATIENT_CLINIC_OR_DEPARTMENT_OTHER): Payer: Medicare HMO | Attending: General Surgery | Admitting: General Surgery

## 2022-02-28 DIAGNOSIS — I872 Venous insufficiency (chronic) (peripheral): Secondary | ICD-10-CM | POA: Diagnosis not present

## 2022-02-28 DIAGNOSIS — L98491 Non-pressure chronic ulcer of skin of other sites limited to breakdown of skin: Secondary | ICD-10-CM | POA: Insufficient documentation

## 2022-02-28 DIAGNOSIS — M1990 Unspecified osteoarthritis, unspecified site: Secondary | ICD-10-CM | POA: Insufficient documentation

## 2022-02-28 DIAGNOSIS — L97421 Non-pressure chronic ulcer of left heel and midfoot limited to breakdown of skin: Secondary | ICD-10-CM | POA: Insufficient documentation

## 2022-02-28 DIAGNOSIS — E11621 Type 2 diabetes mellitus with foot ulcer: Secondary | ICD-10-CM | POA: Diagnosis present

## 2022-02-28 DIAGNOSIS — I10 Essential (primary) hypertension: Secondary | ICD-10-CM | POA: Diagnosis not present

## 2022-02-28 DIAGNOSIS — E1151 Type 2 diabetes mellitus with diabetic peripheral angiopathy without gangrene: Secondary | ICD-10-CM | POA: Insufficient documentation

## 2022-03-01 NOTE — Progress Notes (Signed)
Cheryl, Delgado ([id number]) [phone] [id number] Page 1 of 4 Visit Report for 02/28/2022 Abuse Risk Screen Details Patient Name: Date of Service: Cheryl Delgado, Cheryl Delgado 02/28/2022 1:30 PM Medical Record Number: [id number] Patient Account Number: [id number] Date of Birth/Sex: Treating RN: 1945-12-02 (76 y.o. Marta Lamas Primary Care Shakesha Soltau: Pricilla Holm Other Clinician: Referring Fard Borunda: Treating Doss Cybulski/Extender: Durenda Age in Treatment: 0 Abuse Risk Screen Items Answer ABUSE RISK SCREEN: Has anyone close to you tried to hurt or harm you recentlyo No Do you feel uncomfortable with anyone in your familyo No Has anyone forced you do things that you didnt want to doo No Electronic Signature(s) Signed: 02/28/2022 4:56:15 PM By: Blanche East RN Entered By: Blanche East on 02/28/2022 13:49:43 -------------------------------------------------------------------------------- Activities of Daily Living Details Patient Name: Date of Service: Cheryl, Delgado Delgado. 02/28/2022 1:30 PM Medical Record Number: [id number] Patient Account Number: [id number] Date of Birth/Sex: Treating RN: 10-Nov-1945 (76 y.o. Marta Lamas Primary Care Keng Jewel: Pricilla Holm Other Clinician: Referring Racquelle Hyser: Treating Ruthella Kirchman/Extender: Durenda Age in Treatment: 0 Activities of Daily Living Items Answer Activities of Daily Living (Please select one for each item) Drive Automobile Completely Able T Medications ake Completely Able Use T elephone Completely Able Care for Appearance Completely Able Use T oilet Completely Able Bath / Shower Completely Able Dress Self Completely Able Feed Self Completely Able Walk Completely Able Get In / Out Bed Completely Able Housework Completely Able Prepare Meals Completely Able Handle Money Completely Able Shop for Self Completely Able Electronic  Signature(s) Signed: 02/28/2022 4:56:15 PM By: Blanche East RN Entered By: Blanche East on 02/28/2022 13:50:14 [name] ([id number]) [id number] Page 2 of 4 -------------------------------------------------------------------------------- Education Screening Details Patient Name: Date of Service: Cheryl, Delgado 02/28/2022 1:30 PM Medical Record Number: [id number] Patient Account Number: [id number] Date of Birth/Sex: Treating RN: February 27, 1946 (76 y.o. Marta Lamas Primary Care Koda Routon: Pricilla Holm Other Clinician: Referring Eulala Newcombe: Treating Kalise Fickett/Extender: Durenda Age in Treatment: 0 Primary Learner Assessed: Patient Learning Preferences/Education Level/Primary Language Learning Preference: Explanation Highest Education Level: College or Above Preferred Language: English Cognitive Barrier Language Barrier: No Translator Needed: No Memory Deficit: No Emotional Barrier: No Cultural/Religious Beliefs Affecting Medical Care: No Physical Barrier Impaired Vision: No Impaired Hearing: No Decreased Hand dexterity: No Knowledge/Comprehension Knowledge Level: High Comprehension Level: High Ability to understand written instructions: High Ability to understand verbal instructions: High Motivation Anxiety Level: Calm Cooperation: Cooperative Education Importance: Acknowledges Need Interest in Health Problems: Asks Questions Perception: Coherent Willingness to Engage in Self-Management High Activities: Readiness to Engage in Self-Management High Activities: Electronic Signature(s) Signed: 02/28/2022 4:56:15 PM By: Blanche East RN Entered By: Blanche East on 02/28/2022 13:51:05 -------------------------------------------------------------------------------- Fall Risk Assessment Details Patient Name: Date of Service: Cheryl Delgado, Hollace Delgado. 02/28/2022 1:30 PM Medical Record Number:  [id number] Patient Account Number: [id number] Date of Birth/Sex: Treating RN: 03/17/1946 (76 y.o. Marta Lamas Primary Care Kyisha Fowle: Pricilla Holm Other Clinician: Referring Adonys Wildes: Treating Monisha Siebel/Extender: Durenda Age in Treatment: 0 Fall Risk Assessment Items Have you had 2 or more falls in the last 12 monthso 0 No Bottcher, Tonni Delgado ([id number]) [id number] Page 3 of 4 Have you had any fall that resulted in injury in the last 12 monthso 0 No FALLS RISK SCREEN History of falling - immediate or within 3 months 0 No Secondary diagnosis (Do you have 2 or more medical diagnoseso) 0 No Ambulatory aid None/bed rest/wheelchair/nurse 0 Yes Crutches/cane/walker 0  No Furniture 0 No Intravenous therapy Access/Saline/Heparin Lock 0 No Gait/Transferring Normal/ bed rest/ wheelchair 0 Yes Weak (short steps with or without shuffle, stooped but able to lift head while walking, may seek 0 No support from furniture) Impaired (short steps with shuffle, may have difficulty arising from chair, head down, impaired 0 No balance) Mental Status Oriented to own ability 0 Yes Electronic Signature(s) Signed: 02/28/2022 4:56:15 PM By: Blanche East RN Entered By: Blanche East on 02/28/2022 13:51:31 -------------------------------------------------------------------------------- Foot Assessment Details Patient Name: Date of Service: Cheryl Delgado, Cheryl Delgado. 02/28/2022 1:30 PM Medical Record Number: [id number] Patient Account Number: [id number] Date of Birth/Sex: Treating RN: 1945/10/20 (76 y.o. Marta Lamas Primary Care Reade Trefz: Pricilla Holm Other Clinician: Referring Amarea Macdowell: Treating Cieanna Stormes/Extender: Durenda Age in Treatment: 0 Foot Assessment Items Site Locations + = Sensation present, - = Sensation absent, C = Callus, U = Ulcer R = Redness, W = Warmth, M = Maceration, PU =  Pre-ulcerative lesion F = Fissure, S = Swelling, D = Dryness Assessment Right: Left: Other Deformity: No No Prior Foot Ulcer: No No Prior Amputation: No No Charcot Joint: No No Ambulatory Status: Ambulatory Without Help GaitAERIAL, DILLEY Delgado ([id number]) [id number] Page 4 of 4 Electronic Signature(s) Signed: 02/28/2022 4:56:15 PM By: Blanche East RN Entered By: Blanche East on 02/28/2022 13:54:44 -------------------------------------------------------------------------------- Nutrition Risk Screening Details Patient Name: Date of Service: ALAA, MULLALLY Delgado. 02/28/2022 1:30 PM Medical Record Number: [id number] Patient Account Number: [id number] Date of Birth/Sex: Treating RN: [birth year] (76 y.o. Iver Nestle, [place] Primary Care Joan Avetisyan: Pricilla Holm Other Clinician: Referring Xaniyah Buchholz: Treating Nakina Spatz/Extender: Durenda Age in Treatment: 0 Height (in): 62 Weight (lbs): 223 Body Mass Index (BMI): 40.8 Nutrition Risk Screening Items Score Screening NUTRITION RISK SCREEN: I have an illness or condition that made me change the kind and/or amount of food I eat 0 No I eat fewer than two meals per day 0 No I eat few fruits and vegetables, or milk products 0 No I have three or more drinks of beer, liquor or wine almost every day 0 No I have tooth or mouth problems that make it hard for me to eat 0 No I don't always have enough money to buy the food I need 0 No I eat alone most of the time 0 No I take three or more different prescribed or over-the-counter drugs a day 0 No Without wanting to, I have lost or gained 10 pounds in the last six months 0 No I am not always physically able to shop, cook and/or feed myself 0 No Nutrition Protocols Good Risk Protocol 0 No interventions needed Moderate Risk Protocol High Risk Proctocol Risk Level: Good Risk Score: 0 Electronic Signature(s) Signed: 02/28/2022  4:56:15 PM By: Blanche East RN Entered By: Blanche East on 02/28/2022 13:51:46

## 2022-03-01 NOTE — Progress Notes (Signed)
Cheryl Delgado, Cheryl Delgado ([id number]) [id number] Page 1 of 11 Visit Report for 02/28/2022 Allergy List Details Patient Name: Date of Service: Cheryl Delgado, Cheryl Delgado 02/28/2022 1:30 PM Medical Record Number: [id number] Patient Account Number: [id number] Date of Birth/Sex: Treating Delgado: [birth year] (76 y.o. Cheryl Delgado Primary Care Cheryl Delgado: Cheryl Delgado Other Clinician: Referring Cheryl Delgado: Treating Cheryl Delgado/Extender: Cheryl Delgado Age in Treatment: 0 Allergies Active Allergies No Known Drug Allergies Allergy Notes Electronic Signature(s) Signed: 02/28/2022 4:56:15 PM By: Cheryl Delgado Entered By: Cheryl Delgado on 02/28/2022 13:43:55 -------------------------------------------------------------------------------- Arrival Information Details Patient Name: Date of Service: Cheryl Delgado, Cheryl Delgado. 02/28/2022 1:30 PM Medical Record Number: [id number] Patient Account Number: [id number] Date of Birth/Sex: Treating Delgado: [birth year] (76 y.o. Cheryl Delgado, Cheryl Delgado Primary Care Cheryl Delgado: Cheryl Delgado Other Clinician: Referring Cheryl Delgado: Treating Cheryl Delgado/Extender: Cheryl Delgado Age in Treatment: 0 Visit Information Patient Arrived: Ambulatory Arrival Time: 13:33 Accompanied By: husband Transfer Assistance: None History Since Last Visit Added or deleted any medications: No Any new allergies or adverse reactions: No Had a fall or experienced change in activities of daily living that may affect risk of falls: No Signs or symptoms of abuse/neglect since last visito No Hospitalized since last visit: No Implantable device outside of the clinic excluding cellular tissue based products placed in the center since last visit: No Electronic Signature(s) Signed: 02/28/2022 4:56:15 PM By: Cheryl Delgado Entered By: Cheryl Delgado on 02/28/2022 13:42:43 Cheryl Delgado ([id number]) [id number] Page 2 of  11 -------------------------------------------------------------------------------- Clinic Level of Care Assessment Details Patient Name: Date of Service: Cheryl Delgado 02/28/2022 1:30 PM Medical Record Number: [id number] Patient Account Number: [id number] Date of Birth/Sex: Treating Delgado: [birth year] (76 y.o. Cheryl Delgado Primary Care Cheryl Delgado: Cheryl Delgado Other Clinician: Referring Taylan Marez: Treating Cheryl Delgado/Extender: Cheryl Delgado Age in Treatment: 0 Clinic Level of Care Assessment Items TOOL 1 Quantity Score X- 1 0 Use when EandM and Procedure is performed on INITIAL visit ASSESSMENTS - Nursing Assessment / Reassessment X- 1 20 General Physical Exam (combine w/ comprehensive assessment (listed just below) when performed on new pt. evals) X- 1 25 Comprehensive Assessment (HX, ROS, Risk Assessments, Wounds Hx, etc.) ASSESSMENTS - Wound and Skin Assessment / Reassessment X- 1 10 Dermatologic / Skin Assessment (not related to wound area) ASSESSMENTS - Ostomy and/or Continence Assessment and Care '[]'$  - 0 Incontinence Assessment and Management '[]'$  - 0 Ostomy Care Assessment and Management (repouching, etc.) PROCESS - Coordination of Care X - Simple Patient / Family Education for ongoing care 1 15 '[]'$  - 0 Complex (extensive) Patient / Family Education for ongoing care X- 1 10 Staff obtains Programmer, systems, Records, T Results / Process Orders est X- 1 10 Staff telephones HHA, Nursing Homes / Clarify orders / etc '[]'$  - 0 Routine Transfer to another Facility (non-emergent condition) '[]'$  - 0 Routine Hospital Admission (non-emergent condition) '[]'$  - 0 New Admissions / Biomedical engineer / Ordering NPWT Apligraf, etc. , '[]'$  - 0 Emergency Hospital Admission (emergent condition) PROCESS - Special Needs '[]'$  - 0 Pediatric / Minor Patient Management '[]'$  - 0 Isolation Patient Management '[]'$  - 0 Hearing / Language / Visual special needs '[]'$  -  0 Assessment of Community assistance (transportation, D/C planning, etc.) '[]'$  - 0 Additional assistance / Altered mentation '[]'$  - 0 Support Surface(s) Assessment (bed, cushion, seat, etc.) INTERVENTIONS - Miscellaneous '[]'$  - 0 External ear exam '[]'$  - 0 Patient Transfer (multiple staff / Civil Service fast streamer / Similar devices) '[]'$  - 0 Simple Staple / Suture removal (  25 or less) '[]'$  - 0 Complex Staple / Suture removal (26 or more) '[]'$  - 0 Hypo/Hyperglycemic Management (do not check if billed separately) X- 1 15 Ankle / Brachial Index (ABI) - do not check if billed separately Has the patient been seen at the hospital within the last three years: Yes Total Score: 105 Level Of Care: New/Established - Level 3 Electronic Signature(s) Signed: 02/28/2022 4:56:15 PM By: Cheryl Delgado Entered By: Cheryl Delgado on 02/28/2022 16:26:39 Cheryl Delgado ([id number]) [id number] Page 3 of 11 -------------------------------------------------------------------------------- Compression Therapy Details Patient Name: Date of Service: Cheryl Delgado 02/28/2022 1:30 PM Medical Record Number: [id number] Patient Account Number: [id number] Date of Birth/Sex: Treating Delgado: [birth year] (75 y.o. Cheryl Delgado Primary Care Cheryl Delgado: Cheryl Delgado Other Clinician: Referring Cheryl Delgado: Treating Cheryl Delgado/Extender: Cheryl Delgado Age in Treatment: 0 Compression Therapy Performed for Wound Assessment: Wound #2 Left,Lateral Foot Performed By: Clinician Cheryl East, Delgado Compression Type: Three Layer Post Procedure Diagnosis Same as Pre-procedure Electronic Signature(s) Signed: 02/28/2022 4:56:15 PM By: Cheryl Delgado Entered By: Cheryl Delgado on 02/28/2022 14:31:04 -------------------------------------------------------------------------------- Encounter Discharge Information Details Patient Name: Date of Service: Cheryl Delgado, Cheryl Delgado. 02/28/2022 1:30 PM Medical  Record Number: [id number] Patient Account Number: [id number] Date of Birth/Sex: Treating Delgado: Cheryl Delgado (76 y.o. Cheryl Delgado Primary Care Jonell Krontz: Cheryl Delgado Other Clinician: Referring Cheryl Jelinski: Treating Braedon Sjogren/Extender: Cheryl Delgado Age in Treatment: 0 Encounter Discharge Information Items Post Procedure Vitals Discharge Condition: Stable Temperature (F): 97.7 Ambulatory Status: Ambulatory Pulse (bpm): 85 Discharge Destination: Home Respiratory Rate (breaths/min): 16 Transportation: Private Auto Blood Pressure (mmHg): 121/71 Accompanied By: husband Schedule Follow-up Appointment: Yes Clinical Summary of Care: Electronic Signature(s) Signed: 02/28/2022 4:56:15 PM By: Cheryl Delgado Entered By: Cheryl Delgado on 02/28/2022 16:28:35 -------------------------------------------------------------------------------- Lower Extremity Assessment Details Patient Name: Date of Service: Cheryl Delgado, Cheryl Delgado. 02/28/2022 1:30 PM Medical Record Number: [id number] Patient Account Number: [id number] Date of Birth/Sex: Treating Delgado: [birth year] (76 y.o. Cheryl Delgado Primary Care Tachina Spoonemore: Cheryl Delgado Other Clinician: Referring Tearra Ouk: Treating Kriston Mckinnie/Extender: Cheryl Delgado Age in Treatment: 0 Edema Assessment Left: [Left: Right] [Right: :] Assessed: [Left: No] [Right: No] [Left: Edema] [Right: :] Calf Left: Right: Point of Measurement: From Medial Instep 42 cm Ankle Left: Right: Point of Measurement: From Medial Instep 32 cm Knee To Floor Left: Right: From Medial Instep 39 cm Vascular Assessment Pulses: Dorsalis Pedis Palpable: [Left:No] [Right:No] Doppler Audible: [Left:Yes] [Right:Yes] Blood Pressure: Brachial: [Left:121] [Right:121] Ankle: [Left:Dorsalis Pedis: 140 1.16] [Right:Dorsalis Pedis: 145 1.20] Electronic Signature(s) Signed: 02/28/2022 4:56:15 PM By: Cheryl Delgado Entered By: Cheryl Delgado on 02/28/2022 14:04:50 -------------------------------------------------------------------------------- Multi Wound Chart Details Patient Name: Date of Service: Cheryl Delgado, Ardelia Delgado. 02/28/2022 1:30 PM Medical Record Number: [id number] Patient Account Number: [id number] Date of Birth/Sex: Treating Delgado: [birth year] (76 y.o. F) Primary Care Nefertiti Mohamad: Cheryl Delgado Other Clinician: Referring Keithan Dileonardo: Treating Jordynne Mccown/Extender: Cheryl Delgado Age in Treatment: 0 Vital Signs Height(in): 28 Pulse(bpm): 24 Weight(lbs): 644 Blood Pressure(mmHg): 121/71 Body Mass Index(BMI): 40.8 Temperature(F): 97.7 Respiratory Rate(breaths/min): 16 [2:Photos:] [3:No Photos] [N/A:N/A] Left, Lateral Foot Left, Plantar Foot N/A Wound Location: Blister Blister N/A Wounding Event: Diabetic Wound/Ulcer of the Lower Diabetic Wound/Ulcer of the Lower N/A Primary Etiology: Extremity Extremity Cataracts, Hypertension, Type II Cataracts, Hypertension, Type II N/A Comorbid History: Diabetes, Osteoarthritis Diabetes, Osteoarthritis 02/15/2022 02/15/2022 N/A Date Acquired: KAVITA, BARTL ([id number]) [id number] Page 5 of 11 0 0 N/A Weeks of Treatment: Open Open N/A Wound Status: No No N/A  Wound Recurrence: 3.8x3.6x0.1 0.5x0.5x0.1 N/A Measurements L x W x D (cm) 10.744 0.196 N/A A (cm) : rea 1.074 0.02 N/A Volume (cm) : Grade 1 Grade 1 N/A Classification: None Present Medium N/A Exudate A mount: N/A Flat and Intact N/A Wound Margin: Small (1-33%) Medium (34-66%) N/A Granulation A mount: Red Pink N/A Granulation Quality: Large (67-100%) Medium (34-66%) N/A Necrotic A mount: Eschar N/A N/A Necrotic Tissue: Fat Layer (Subcutaneous Tissue): Yes N/A N/A Exposed Structures: Fascia: No Tendon: No Joint: No Bone: No Debridement - Selective/Open Wound Debridement - Selective/Open Wound N/A Debridement: Pre-procedure Verification/Time Out  14:21 14:21 N/A Taken: Lidocaine 5% topical ointment Lidocaine 5% topical ointment N/A Pain Control: Psychologist, prison and probation services, Slough N/A Tissue Debrided: Skin/Epidermis Non-Viable Tissue N/A Level: 13.68 0.25 N/A Debridement A (sq cm): rea Curette Curette N/A Instrument: Minimum Minimum N/A Bleeding: Pressure Pressure N/A Hemostasis A chieved: 0 0 N/A Procedural Pain: 0 0 N/A Post Procedural Pain: Procedure was tolerated well Procedure was tolerated well N/A Debridement Treatment Response: 3.8x3.6x0.1 0.5x0.5x0.1 N/A Post Debridement Measurements L x W x D (cm) 1.074 0.02 N/A Post Debridement Volume: (cm) Dry/Scaly: Yes Dry/Scaly: Yes N/A Periwound Skin Moisture: No Abnormalities Noted N/A Periwound Skin Color: Compression Therapy Debridement N/A Procedures Performed: Debridement Treatment Notes Electronic Signature(s) Signed: 02/28/2022 2:34:20 PM By: Fredirick Maudlin MD FACS Entered By: Fredirick Maudlin on 02/28/2022 14:34:19 -------------------------------------------------------------------------------- Multi-Disciplinary Care Plan Details Patient Name: Date of Service: Cheryl Delgado, Cheryl Delgado. 02/28/2022 1:30 PM Medical Record Number: [id number] Patient Account Number: [id number] Date of Birth/Sex: Treating Delgado: [birth year] (76 y.o. Cheryl Delgado Primary Care Teren Franckowiak: Cheryl Delgado Other Clinician: Referring Lytle Malburg: Treating Rolland Steinert/Extender: Cheryl Delgado Age in Treatment: 0 Active Inactive Nutrition Nursing Diagnoses: Impaired glucose control: actual or potential Goals: Patient/caregiver verbalizes understanding of need to maintain therapeutic glucose control per primary care physician Date Initiated: 02/28/2022 Target Resolution Date: 03/27/2022 Goal Status: Active Interventions: Assess patient nutrition upon admission and as needed per policy Provide education on nutrition Cheryl Delgado, Cheryl Delgado ([id number])  [id number] Page 6 of 11 Treatment Activities: Dietary management education, guidance and counseling : 02/28/2022 Giving encouragement to exercise : 02/28/2022 Notes: Orientation to the Wound Care Program Nursing Diagnoses: Knowledge deficit related to the wound healing center program Goals: Patient/caregiver will verbalize understanding of the [facility] Date Initiated: 02/28/2022 Target Resolution Date: 03/20/2022 Goal Status: Active Interventions: Provide education on orientation to the wound center Notes: Wound/Skin Impairment Nursing Diagnoses: Knowledge deficit related to ulceration/compromised skin integrity Goals: Ulcer/skin breakdown will have a volume reduction of 30% by week 4 Date Initiated: 02/28/2022 Target Resolution Date: 04/03/2022 Goal Status: Active Interventions: Assess ulceration(s) every visit Provide education on ulcer and skin care Treatment Activities: Skin care regimen initiated : 02/28/2022 Topical wound management initiated : 02/28/2022 Notes: Electronic Signature(s) Signed: 02/28/2022 4:56:15 PM By: Cheryl Delgado Entered By: Cheryl Delgado on 02/28/2022 14:35:41 -------------------------------------------------------------------------------- Pain Assessment Details Patient Name: Date of Service: Cheryl Delgado, Augustine Delgado. 02/28/2022 1:30 PM Medical Record Number: [id number] Patient Account Number: [id number] Date of Birth/Sex: Treating Delgado: [birth year] Delgado (76 y.o. Cheryl Delgado Primary Care Ossie Beltran: Cheryl Delgado Other Clinician: Referring Canesha Tesfaye: Treating Gordy Goar/Extender: Cheryl Delgado Age in Treatment: 0 Active Problems Location of Pain Severity and Description of Pain Patient Has Paino No Site Locations Cheryl Delgado, Cheryl Delgado ([id number]) [id number] Page 7 of 11 Pain Management and Medication Current Pain Management: Electronic  Signature(s) Signed: 02/28/2022 4:56:15 PM By: Cheryl Delgado Entered By: Cheryl Delgado on 02/28/2022 14:16:00 -------------------------------------------------------------------------------- Patient/Caregiver Education  Details Patient Name: Date of Service: Cheryl Delgado, Cheryl Delgado 11/16/2023andnbsp1:30 PM Medical Record Number: [id number] Patient Account Number: [id number] Date of Birth/Gender: Treating Delgado: [birth year] (76 y.o. Cheryl Delgado Primary Care Physician: Cheryl Delgado Other Clinician: Referring Physician: Treating Physician/Extender: Cheryl Delgado Age in Treatment: 0 Education Assessment Education Provided To: Patient Education Topics Provided Nutrition: Handouts: Elevated Blood Sugars: How Do They Affect Wound Healing, Nutrition Methods: Explain/Verbal Responses: Reinforcements needed, State content correctly [place]: o Handouts: Welcome T [facility] o Methods: Explain/Verbal Responses: Reinforcements needed, State content correctly Wound/Skin Impairment: Handouts: Caring for Your Ulcer, Skin Care Do's and Dont's Methods: Explain/Verbal Responses: Reinforcements needed, State content correctly Electronic Signature(s) Signed: 02/28/2022 4:56:15 PM By: Cheryl Delgado Entered By: Cheryl Delgado on 02/28/2022 Morrison, Neyda Delgado ([id number]) [id number] Page 8 of 11 -------------------------------------------------------------------------------- Wound Assessment Details Patient Name: Date of Service: Cheryl Delgado, Cheryl Delgado 02/28/2022 1:30 PM Medical Record Number: [id number] Patient Account Number: [id number] Date of Birth/Sex: Treating Delgado: [birth year] (76 y.o. Cheryl Delgado, Cheryl Delgado Primary Care Kaysie Michelini: Cheryl Delgado Other Clinician: Referring Donnette Macmullen: Treating Zyanya Glaza/Extender: Cheryl Delgado Age in Treatment: 0 Wound Status Wound Number:  2 Primary Etiology: Diabetic Wound/Ulcer of the Lower Extremity Wound Location: Left, Lateral Foot Wound Status: Open Wounding Event: Blister Comorbid History: Cataracts, Hypertension, Type II Diabetes, Osteoarthritis Date Acquired: 02/15/2022 Weeks Of Treatment: 0 Clustered Wound: No Photos Wound Measurements Length: (cm) 3.8 Width: (cm) 3.6 Depth: (cm) 0.1 Area: (cm) 10.744 Volume: (cm) 1.074 % Reduction in Area: % Reduction in Volume: Tunneling: No Undermining: No Wound Description Classification: Grade 1 Exudate Amount: None Present Foul Odor After Cleansing: No Slough/Fibrino Yes Wound Bed Granulation Amount: Small (1-33%) Exposed Structure Granulation Quality: Red Fascia Exposed: No Necrotic Amount: Large (67-100%) Fat Layer (Subcutaneous Tissue) Exposed: Yes Necrotic Quality: Eschar Tendon Exposed: No Joint Exposed: No Bone Exposed: No Periwound Skin Texture Texture Color No Abnormalities Noted: No No Abnormalities Noted: No Moisture No Abnormalities Noted: No Dry / Scaly: Yes Treatment Notes Wound #2 (Foot) Wound Laterality: Left, Lateral Cleanser Soap and Water Discharge Instruction: May shower and wash wound with dial antibacterial soap and water prior to dressing change. Peri-Wound Care SHAKEITHA, UMBAUGH Delgado ([id number]) [id number] Page 9 of 11 Sween Lotion (Moisturizing lotion) Discharge Instruction: Apply moisturizing lotion as directed Topical Primary Dressing KerraCel Ag Gelling Fiber Dressing, 4x5 in (silver alginate) Discharge Instruction: Apply silver alginate to wound bed as instructed Secondary Dressing ABD Pad, 8x10 Discharge Instruction: Apply over primary dressing as directed. Optifoam Non-Adhesive Dressing, 4x4 in Discharge Instruction: Apply over primary dressing as directed. Secured With Transpore Surgical Tape, 2x10 (in/yd) Discharge Instruction: Secure dressing with tape as directed. Compression  Wrap ThreePress (3 layer compression wrap) Discharge Instruction: Apply three layer compression as directed. Compression Stockings Add-Ons Electronic Signature(s) Signed: 02/28/2022 4:56:15 PM By: Cheryl Delgado Entered By: Cheryl Delgado on 02/28/2022 14:13:37 -------------------------------------------------------------------------------- Wound Assessment Details Patient Name: Date of Service: Cheryl Delgado, Cheryl Delgado. 02/28/2022 1:30 PM Medical Record Number: [id number] Patient Account Number: [id number] Date of Birth/Sex: Treating Delgado: [birth year] (76 y.o. Cheryl Delgado Primary Care Sila Sarsfield: Cheryl Delgado Other Clinician: Referring Tajana Crotteau: Treating Nichelle Renwick/Extender: Cheryl Delgado Age in Treatment: 0 Wound Status Wound Number: 3 Primary Etiology: Diabetic Wound/Ulcer of the Lower Extremity Wound Location: Left, Plantar Foot Wound Status: Open Wounding Event: Blister Comorbid History: Cataracts, Hypertension, Type II Diabetes, Osteoarthritis Date Acquired: 02/15/2022 Weeks Of Treatment: 0 Clustered Wound: No Wound Measurements Length: (cm)  0.5 Width: (cm) 0.5 Depth: (cm) 0.1 Area: (cm) 0.196 Volume: (cm) 0.02 % Reduction in Area: % Reduction in Volume: Tunneling: No Undermining: No Wound Description Classification: Grade 1 Wound Margin: Flat and Intact Exudate Amount: Medium Foul Odor After Cleansing: No Slough/Fibrino Yes Wound Bed Granulation Amount: Medium (34-66%) Granulation Quality: Pink Necrotic Amount: Medium (34-66%) Periwound Skin Texture Mcginn, Glorene Delgado ([id number]) [id number] Page 10 of 11 Texture Color No Abnormalities Noted: No No Abnormalities Noted: Yes Moisture No Abnormalities Noted: No Dry / Scaly: Yes Treatment Notes Wound #3 (Foot) Wound Laterality: Plantar, Left Cleanser Soap and Water Discharge Instruction: May shower and wash wound with dial antibacterial soap and water prior  to dressing change. Peri-Wound Care Sween Lotion (Moisturizing lotion) Discharge Instruction: Apply moisturizing lotion as directed Topical Primary Dressing KerraCel Ag Gelling Fiber Dressing, 4x5 in (silver alginate) Discharge Instruction: Apply silver alginate to wound bed as instructed Secondary Dressing ABD Pad, 8x10 Discharge Instruction: Apply over primary dressing as directed. Optifoam Non-Adhesive Dressing, 4x4 in Discharge Instruction: Apply over primary dressing as directed. Secured With Transpore Surgical Tape, 2x10 (in/yd) Discharge Instruction: Secure dressing with tape as directed. Compression Wrap ThreePress (3 layer compression wrap) Discharge Instruction: Apply three layer compression as directed. Compression Stockings Add-Ons Electronic Signature(s) Signed: 02/28/2022 4:56:15 PM By: Cheryl Delgado Entered By: Cheryl Delgado on 02/28/2022 14:24:55 -------------------------------------------------------------------------------- Vitals Details Patient Name: Date of Service: Cheryl Delgado, Cheryl Delgado. 02/28/2022 1:30 PM Medical Record Number: [id number] Patient Account Number: [id number] Date of Birth/Sex: Treating Delgado: [birth year] (76 y.o. Cheryl Delgado, Cheryl Delgado Primary Care Alisha Bacus: Cheryl Delgado Other Clinician: Referring Tashi Andujo: Treating Carlos Quackenbush/Extender: Cheryl Delgado Age in Treatment: 0 Vital Signs Time Taken: 13:42 Temperature (F): 97.7 Height (in): 62 Pulse (bpm): 85 Source: Stated Respiratory Rate (breaths/min): 16 Weight (lbs): 223 Blood Pressure (mmHg): 121/71 Source: Stated Reference Range: 80 - 120 mg / dl Body Mass Index (BMI): 40.8 Electronic Signature(s) Signed: 02/28/2022 4:56:15 PM By: Cheryl Delgado Jeralyn Ruths, Baeleigh Delgado ([id number]) [id number] Page 11 of 11 Signed: 02/28/2022 4:56:15 PM By: Cheryl Delgado Entered By: Cheryl Delgado on 02/28/2022 13:43:31

## 2022-03-01 NOTE — Progress Notes (Signed)
ZHURI, KRASS ([id number]) [id number] Page 1 of 11 Visit Report for 02/28/2022 Chief Complaint Document Details Patient Name: Date of Service: Cheryl Delgado, NULTY 02/28/2022 1:30 PM Medical Record Number: [id number] Patient Account Number: [id number] Date of Birth/Sex: Treating RN: November 12, 1945 (76 y.o. F) Primary Care Provider: Pricilla Holm Other Clinician: Referring Provider: Treating Provider/Extender: Durenda Age in Treatment: 0 Information Obtained from: Patient Chief Complaint Left lower extremity wound 02/28/2022: left foot ulcer Electronic Signature(s) Signed: 02/28/2022 2:34:28 PM By: Fredirick Maudlin MD FACS Previous Signature: 02/28/2022 1:54:08 PM Version By: Fredirick Maudlin MD FACS Entered By: Fredirick Maudlin on 02/28/2022 14:34:27 -------------------------------------------------------------------------------- Debridement Details Patient Name: Date of Service: Cheryl Delgado, Cheryl Delgado. 02/28/2022 1:30 PM Medical Record Number: [id number] Patient Account Number: [id number] Date of Birth/Sex: Treating RN: Nov 26, 1945 (76 y.o. Iver Nestle, Jamie Primary Care Provider: Pricilla Holm Other Clinician: Referring Provider: Treating Provider/Extender: Durenda Age in Treatment: 0 Debridement Performed for Assessment: Wound #3 [place] Performed By: Physician Fredirick Maudlin, MD Debridement Type: Debridement Severity of Tissue Pre Debridement: Fat layer exposed Level of Consciousness (Pre-procedure): Awake and Alert Pre-procedure Verification/Time Out Yes - 14:21 Taken: Start Time: 14:22 Pain Control: Lidocaine 5% topical ointment T Area Debrided (L x W): otal 0.5 (cm) x 0.5 (cm) = 0.25 (cm) Tissue and other material debrided: Non-Viable, Eschar, Slough, Slough Level: Non-Viable Tissue Debridement Description: Selective/Open Wound Instrument: Curette Bleeding:  Minimum Hemostasis Achieved: Pressure Procedural Pain: 0 Post Procedural Pain: 0 Response to Treatment: Procedure was tolerated well Level of Consciousness (Post- Awake and Alert procedure): Post Debridement Measurements of Total Wound Length: (cm) 0.5 Width: (cm) 0.5 Depth: (cm) 0.1 Volume: (cm) 0.02 Character of Wound/Ulcer Post Debridement: Requires Further Debridement Cheryl, Schuff Shariah Delgado ([id number]) [id number] Page 2 of 11 Severity of Tissue Post Debridement: Fat layer exposed Post Procedure Diagnosis Same as Pre-procedure Notes Scribed for Dr. Celine Ahr by Blanche East, RN Electronic Signature(s) Signed: 02/28/2022 3:05:34 PM By: Fredirick Maudlin MD FACS Signed: 02/28/2022 4:56:15 PM By: Blanche East RN Entered By: Blanche East on 02/28/2022 14:29:48 -------------------------------------------------------------------------------- Debridement Details Patient Name: Date of Service: Cheryl Delgado, [name]. 02/28/2022 1:30 PM Medical Record Number: [id number] Patient Account Number: [id number] Date of Birth/Sex: Treating RN: Sep 06, 1945 (76 y.o. Iver Nestle, Jamie Primary Care Provider: Pricilla Holm Other Clinician: Referring Provider: Treating Provider/Extender: Durenda Age in Treatment: 0 Debridement Performed for Assessment: Wound #2 Left,Lateral Foot Performed By: Physician Fredirick Maudlin, MD Debridement Type: Debridement Severity of Tissue Pre Debridement: Fat layer exposed Level of Consciousness (Pre-procedure): Awake and Alert Pre-procedure Verification/Time Out Yes - 14:21 Taken: Start Time: 14:22 Pain Control: Lidocaine 5% topical ointment T Area Debrided (L x W): otal 3.8 (cm) x 3.6 (cm) = 13.68 (cm) Tissue and other material debrided: Non-Viable, Slough, Skin: Epidermis, Slough Level: Skin/Epidermis Debridement Description: Selective/Open Wound Instrument: Curette Bleeding: Minimum Hemostasis  Achieved: Pressure Procedural Pain: 0 Post Procedural Pain: 0 Response to Treatment: Procedure was tolerated well Level of Consciousness (Post- Awake and Alert procedure): Post Debridement Measurements of Total Wound Length: (cm) 3.8 Width: (cm) 3.6 Depth: (cm) 0.1 Volume: (cm) 1.074 Character of Wound/Ulcer Post Debridement: Requires Further Debridement Severity of Tissue Post Debridement: Fat layer exposed Post Procedure Diagnosis Same as Pre-procedure Notes Scribed for Dr. Celine Ahr by Blanche East, RN Electronic Signature(s) Signed: 02/28/2022 3:05:34 PM By: Fredirick Maudlin MD FACS Signed: 02/28/2022 4:56:15 PM By: Blanche East RN Entered By: Blanche East on 02/28/2022 14:30:47 Cheryl Delgado ([id number]) [id number] Page 3 of 11 --------------------------------------------------------------------------------  HPI Details Patient Name: Date of Service: Cheryl Delgado, TSUI 02/28/2022 1:30 PM Medical Record Number: [id number] Patient Account Number: [id number] Date of Birth/Sex: Treating RN: [birth year] (75 y.o. F) Primary Care Provider: Pricilla Holm Other Clinician: Referring Provider: Treating Provider/Extender: Durenda Age in Treatment: 0 History of Present Illness HPI Description: Admission 03/16/2021 Ms. Cheryl Delgado is a 76 year old female with a past medical history of uncontrolled type 2 diabetes on oral agents with last hemoglobin A1c of 9, essential hypertension, chronic venous insufficiency and tobacco dependence that presents to the clinic for a 1 month history of left lower extremity wound that waxes and wanes. She was hospitalized on 03/02/2021 for left lower extremity cellulitis. She was treated with IV antibiotics and discharged with oral medication. She is currently not taking any antibiotics at this time. She reports improvement in her symptoms but continues to develop wounds to her lower extremity.  She uses compression stockings daily. She currently denies signs of infection. 03/23/2021; patient presents for follow-up. She tolerated the compression wrap well. She has no issues or complaints today. READMISSION 02/28/2022 This is a now 76 year old woman who was previously seen in our clinic for a left lower extremity wound. She has a history of venous insufficiency and recently developed swelling in her legs that resulted in a blister on her left foot. This ruptured and she now has a wound. She saw her primary care provider who gave her a course of Bactrim and also prescribed Silvadene for her to apply while she waited for her wound care visit. Her most recent hemoglobin A1c was 7.9% on December 07, 2021. Previous ABIs done about a year ago showed a right ABI of 1.05 with a TBI of 0.7 and a left ABI of 1.01 with a TBI of 0.61. On inspection, she had apparently several wounds that have now healed. There is a lot of dry thickened skin around what looks like old ulcers that have dried up. On the plantar surface at about the midfoot, there is a tiny opening in her skin with some associated periwound callus. On the lateral aspect of her midfoot, there is some dark skin with boggy tissue underlying this. Underneath the darkened skin, there is a superficial ulcer limited to breakdown of skin. Electronic Signature(s) Signed: 02/28/2022 2:36:07 PM By: Fredirick Maudlin MD FACS Previous Signature: 02/28/2022 1:56:08 PM Version By: Fredirick Maudlin MD FACS Entered By: Fredirick Maudlin on 02/28/2022 14:36:07 -------------------------------------------------------------------------------- Physical Exam Details Patient Name: Date of Service: Cheryl Delgado, Jadee Delgado. 02/28/2022 1:30 PM Medical Record Number: [id number] Patient Account Number: [id number] Date of Birth/Sex: Treating RN: 04-Oct-1945 (76 y.o. F) Primary Care Provider: Pricilla Holm Other Clinician: Referring Provider: Treating Provider/Extender:  Durenda Age in Treatment: 0 Constitutional . . . . No acute distress. Respiratory Normal work of breathing on room air.. Cardiovascular 2+ pitting edema to above the knees bilaterally. Notes 02/28/2022: On inspection, she had apparently several wounds that have now healed. There is a lot of dry thickened skin around what looks like old blisters that have dried up. On the plantar surface at about the midfoot, there is a tiny opening in her skin with some associated periwound callus. On the lateral aspect of her midfoot, there is some dark skin with boggy tissue underlying this. Underneath the darkened skin, there is a superficial ulcer limited to breakdown of skin. Electronic Signature(s) Signed: 02/28/2022 2:36:53 PM By: Fredirick Maudlin MD FACS Entered By: Fredirick Maudlin on 02/28/2022 14:36:53 Villalona, Nabila Delgado (  [id number]) [id number] Page 4 of 11 -------------------------------------------------------------------------------- Physician Orders Details Patient Name: Date of Service: LAVERNA, DOSSETT. 02/28/2022 1:30 PM Medical Record Number: [id number] Patient Account Number: [id number] Date of Birth/Sex: Treating RN: 01-Feb-1946 (76 y.o. Iver Nestle, Jamie Primary Care Provider: Pricilla Holm Other Clinician: Referring Provider: Treating Provider/Extender: Durenda Age in Treatment: 0 Verbal / Phone Orders: No Diagnosis Coding ICD-10 Coding Code Description L97.421 Non-pressure chronic ulcer of left heel and midfoot limited to breakdown of skin E11.621 Type 2 diabetes mellitus with foot ulcer I87.2 Venous insufficiency (chronic) (peripheral) Follow-up Appointments ppointment in 1 week. - Dr. Celine Ahr Rm 3 Return A Wednesday 03/06/22 at 3:15 PM Anesthetic Wound #2 Left,Lateral Foot (In clinic) Topical Lidocaine 5% applied to wound bed Wound #3 Left,Plantar Foot (In clinic) Topical  Lidocaine 5% applied to wound bed Bathing/ Shower/ Hygiene May shower with protection but do not get wound dressing(s) wet. - Keep dressing dry at all times, apply a cast protector that can be purchased at [facility] or [facility] Edema Control - Lymphedema / SCD / Other Left Lower Extremity Elevate legs to the level of the heart or above for 30 minutes daily and/or when sitting, a frequency of: Avoid standing for long periods of time. Patient to wear own compression stockings every day. - Rt lower leg Moisturize legs daily. Wound Treatment Wound #2 - Foot Wound Laterality: Left, Lateral Cleanser: Soap and Water Discharge Instructions: May shower and wash wound with dial antibacterial soap and water prior to dressing change. Peri-Wound Care: Sween Lotion (Moisturizing lotion) Discharge Instructions: Apply moisturizing lotion as directed Prim Dressing: KerraCel Ag Gelling Fiber Dressing, 4x5 in (silver alginate) ary Discharge Instructions: Apply silver alginate to wound bed as instructed Secondary Dressing: ABD Pad, 8x10 Discharge Instructions: Apply over primary dressing as directed. Secondary Dressing: Optifoam Non-Adhesive Dressing, 4x4 in Discharge Instructions: Apply over primary dressing as directed. Secured With: Transpore Surgical Tape, 2x10 (in/yd) Discharge Instructions: Secure dressing with tape as directed. Compression Wrap: ThreePress (3 layer compression wrap) Discharge Instructions: Apply three layer compression as directed. Wound #3 - Foot Wound Laterality: Plantar, Left Cleanser: Soap and Water Discharge Instructions: May shower and wash wound with dial antibacterial soap and water prior to dressing change. TRISTIAN, BOUSKA ([id number]) [id number] Page 5 of 11 Peri-Wound Care: Sween Lotion (Moisturizing lotion) Discharge Instructions: Apply moisturizing lotion as directed Prim Dressing: KerraCel Ag Gelling Fiber Dressing, 4x5 in (silver  alginate) ary Discharge Instructions: Apply silver alginate to wound bed as instructed Secondary Dressing: ABD Pad, 8x10 Discharge Instructions: Apply over primary dressing as directed. Secondary Dressing: Optifoam Non-Adhesive Dressing, 4x4 in Discharge Instructions: Apply over primary dressing as directed. Secured With: Transpore Surgical Tape, 2x10 (in/yd) Discharge Instructions: Secure dressing with tape as directed. Compression Wrap: ThreePress (3 layer compression wrap) Discharge Instructions: Apply three layer compression as directed. Electronic Signature(s) Signed: 02/28/2022 3:05:34 PM By: Fredirick Maudlin MD FACS Entered By: Fredirick Maudlin on 02/28/2022 14:38:17 -------------------------------------------------------------------------------- Problem List Details Patient Name: Date of Service: Cheryl Delgado, Radonna Delgado. 02/28/2022 1:30 PM Medical Record Number: [id number] Patient Account Number: [id number] Date of Birth/Sex: Treating RN: 08-Jun-1945 (76 y.o. F) Primary Care Provider: Pricilla Holm Other Clinician: Referring Provider: Treating Provider/Extender: Durenda Age in Treatment: 0 Active Problems ICD-10 Encounter Code Description Active Date MDM Diagnosis L97.421 Non-pressure chronic ulcer of left heel and midfoot limited to breakdown of 02/28/2022 No Yes skin E11.621 Type 2 diabetes mellitus with foot ulcer 02/28/2022 No Yes I87.2 Venous insufficiency (chronic) (peripheral) 02/28/2022  No Yes Inactive Problems Resolved Problems Electronic Signature(s) Signed: 02/28/2022 2:34:10 PM By: Fredirick Maudlin MD FACS Previous Signature: 02/28/2022 1:53:46 PM Version By: Fredirick Maudlin MD FACS Entered By: Fredirick Maudlin on 02/28/2022 14:34:09 Cheryl Delgado ([id number]) [id number] Page 6 of 11 -------------------------------------------------------------------------------- Progress Note Details Patient  Name: Date of Service: MAYLANI, EMBREE Delgado. 02/28/2022 1:30 PM Medical Record Number: [id number] Patient Account Number: [id number] Date of Birth/Sex: Treating RN: 10-Oct-1945 (76 y.o. F) Primary Care Provider: Pricilla Holm Other Clinician: Referring Provider: Treating Provider/Extender: Durenda Age in Treatment: 0 Subjective Chief Complaint Information obtained from Patient Left lower extremity wound 02/28/2022: left foot ulcer History of Present Illness (HPI) Admission 03/16/2021 Ms. Modean Mccullum is a 76 year old female with a past medical history of uncontrolled type 2 diabetes on oral agents with last hemoglobin A1c of 9, essential hypertension, chronic venous insufficiency and tobacco dependence that presents to the clinic for a 1 month history of left lower extremity wound that waxes and wanes. She was hospitalized on 03/02/2021 for left lower extremity cellulitis. She was treated with IV antibiotics and discharged with oral medication. She is currently not taking any antibiotics at this time. She reports improvement in her symptoms but continues to develop wounds to her lower extremity. She uses compression stockings daily. She currently denies signs of infection. 03/23/2021; patient presents for follow-up. She tolerated the compression wrap well. She has no issues or complaints today. READMISSION 02/28/2022 This is a now 76 year old woman who was previously seen in our clinic for a left lower extremity wound. She has a history of venous insufficiency and recently developed swelling in her legs that resulted in a blister on her left foot. This ruptured and she now has a wound. She saw her primary care provider who gave her a course of Bactrim and also prescribed Silvadene for her to apply while she waited for her wound care visit. Her most recent hemoglobin A1c was 7.9% on December 07, 2021. Previous ABIs done about a year ago showed a right ABI of 1.05  with a TBI of 0.7 and a left ABI of 1.01 with a TBI of 0.61. On inspection, she had apparently several wounds that have now healed. There is a lot of dry thickened skin around what looks like old ulcers that have dried up. On the plantar surface at about the midfoot, there is a tiny opening in her skin with some associated periwound callus. On the lateral aspect of her midfoot, there is some dark skin with boggy tissue underlying this. Underneath the darkened skin, there is a superficial ulcer limited to breakdown of skin. Patient History Allergies No Known Drug Allergies Family History Diabetes - Mother, Heart Disease - Mother, No family history of Cancer, Hereditary Spherocytosis, Hypertension, Kidney Disease, Lung Disease, Seizures, Stroke, Thyroid Problems, Tuberculosis. Social History Current every day smoker - 6 cig a day, Marital Status - Married, Alcohol Use - Never, Drug Use - No History, Caffeine Use - Moderate - coffee. Medical History Eyes Patient has history of Cataracts Denies history of Glaucoma, Optic Neuritis Ear/Nose/Mouth/Throat Denies history of Chronic sinus problems/congestion, Middle ear problems Hematologic/Lymphatic Denies history of Anemia, Hemophilia, Human Immunodeficiency Virus, Lymphedema, Sickle Cell Disease Respiratory Denies history of Aspiration, Asthma, Chronic Obstructive Pulmonary Disease (COPD), Pneumothorax, Sleep Apnea, Tuberculosis Cardiovascular Patient has history of Hypertension Denies history of Angina, Arrhythmia, Congestive Heart Failure, Coronary Artery Disease, Deep Vein Thrombosis, Hypotension, Myocardial Infarction, Peripheral Arterial Disease, Peripheral Venous Disease, Phlebitis, Vasculitis Gastrointestinal Denies history of  Cirrhosis , Colitis, Crohnoos, Hepatitis A, Hepatitis Delgado, Hepatitis C Endocrine Patient has history of Type II Diabetes Denies history of Type I Diabetes Genitourinary Denies history of End Stage Renal  Disease Immunological Denies history of Lupus Erythematosus, Raynaudoos, Scleroderma Integumentary (Skin) Denies history of History of Burn Musculoskeletal Patient has history of Osteoarthritis - lower back Denies history of Gout, Rheumatoid Arthritis, Osteomyelitis Neurologic Denies history of Dementia, Neuropathy, Quadriplegia, Paraplegia, Seizure Disorder Oncologic Denies history of Received Chemotherapy, Received Radiation HATSUE, Cheryl Delgado ([id number]) [id number] Page 7 of 11 Psychiatric Denies history of Anorexia/bulimia, Confinement Anxiety Patient is treated with Controlled Diet, Oral Agents. Blood sugar is not tested. Hospitalization/Surgery History - 03/02/2021-03/05/2021 Cellulitis. - hysterectomy. Medical A Surgical History Notes nd Constitutional Symptoms (General Health) Cellulitis x4 weeks ago failed oral abx therapy, inpatient for IV 03/02/2021-03/05/2021 Eyes retinopathy right eye Review of Systems (ROS) Constitutional Symptoms (General Health) Denies complaints or symptoms of Fatigue, Fever, Chills, Marked Weight Change. Eyes Denies complaints or symptoms of Dry Eyes, Vision Changes, Glasses / Contacts. Respiratory Denies complaints or symptoms of Chronic or frequent coughs, Shortness of Breath. Cardiovascular Denies complaints or symptoms of Chest pain. Gastrointestinal Denies complaints or symptoms of Frequent diarrhea, Nausea, Vomiting. Neurologic Denies complaints or symptoms of Numbness/parasthesias. Objective Constitutional No acute distress. Vitals Time Taken: 1:42 PM, Height: 62 in, Source: Stated, Weight: 223 lbs, Source: Stated, BMI: 40.8, Temperature: 97.7 F, Pulse: 85 bpm, Respiratory Rate: 16 breaths/min, Blood Pressure: 121/71 mmHg. Respiratory Normal work of breathing on room air.. Cardiovascular 2+ pitting edema to above the knees bilaterally. General Notes: 02/28/2022: On inspection, she had apparently  several wounds that have now healed. There is a lot of dry thickened skin around what looks like old blisters that have dried up. On the plantar surface at about the midfoot, there is a tiny opening in her skin with some associated periwound callus. On the lateral aspect of her midfoot, there is some dark skin with boggy tissue underlying this. Underneath the darkened skin, there is a superficial ulcer limited to breakdown of skin. Integumentary (Hair, Skin) Wound #2 status is Open. Original cause of wound was Blister. The date acquired was: 02/15/2022. The wound is located on the Left,Lateral Foot. The wound measures 3.8cm length x 3.6cm width x 0.1cm depth; 10.744cm^2 area and 1.074cm^3 volume. There is Fat Layer (Subcutaneous Tissue) exposed. There is no tunneling or undermining noted. There is a none present amount of drainage noted. There is small (1-33%) red granulation within the wound bed. There is a large (67-100%) amount of necrotic tissue within the wound bed including Eschar. The periwound skin appearance exhibited: Dry/Scaly. Wound #3 status is Open. Original cause of wound was Blister. The date acquired was: 02/15/2022. The wound is located on the [place]. The wound measures 0.5cm length x 0.5cm width x 0.1cm depth; 0.196cm^2 area and 0.02cm^3 volume. There is no tunneling or undermining noted. There is a medium amount of drainage noted. The wound margin is flat and intact. There is medium (34-66%) pink granulation within the wound bed. There is a medium (34-66%) amount of necrotic tissue within the wound bed. The periwound skin appearance had no abnormalities noted for color. The periwound skin appearance exhibited: Dry/Scaly. Assessment Active Problems ICD-10 Non-pressure chronic ulcer of left heel and midfoot limited to breakdown of skin Type 2 diabetes mellitus with foot ulcer Venous insufficiency (chronic) (peripheral) Procedures Wound #2 Deserae, Jennings Malayja Delgado ([id number])  [id number] Page 8 of 11 Pre-procedure diagnosis of Wound #2 is a Diabetic  Wound/Ulcer of the Lower Extremity located on the Left,Lateral Foot .Severity of Tissue Pre Debridement is: Fat layer exposed. There was a Selective/Open Wound Skin/Epidermis Debridement with a total area of 13.68 sq cm performed by Fredirick Maudlin, MD. With the following instrument(s): Curette to remove Non-Viable tissue/material. Material removed includes [facility] and Skin: Epidermis and after achieving pain control using Lidocaine 5% topical ointment. No specimens were taken. A time out was conducted at 14:21, prior to the start of the procedure. A Minimum amount of bleeding was controlled with Pressure. The procedure was tolerated well with a pain level of 0 throughout and a pain level of 0 following the procedure. Post Debridement Measurements: 3.8cm length x 3.6cm width x 0.1cm depth; 1.074cm^3 volume. Character of Wound/Ulcer Post Debridement requires further debridement. Severity of Tissue Post Debridement is: Fat layer exposed. Post procedure Diagnosis Wound #2: Same as Pre-Procedure General Notes: Scribed for Dr. Celine Ahr by Blanche East, RN. Pre-procedure diagnosis of Wound #2 is a Diabetic Wound/Ulcer of the Lower Extremity located on the Left,Lateral Foot . There was a Three Layer Compression Therapy Procedure by Blanche East, RN. Post procedure Diagnosis Wound #2: Same as Pre-Procedure Wound #3 Pre-procedure diagnosis of Wound #3 is a Diabetic Wound/Ulcer of the Lower Extremity located on the Left,Plantar Foot .Severity of Tissue Pre Debridement is: Fat layer exposed. There was a Selective/Open Wound Non-Viable Tissue Debridement with a total area of 0.25 sq cm performed by Fredirick Maudlin, MD. With the following instrument(s): Curette to remove Non-Viable tissue/material. Material removed includes Eschar and Slough and after achieving pain control using Lidocaine 5% topical ointment.  No specimens were taken. A time out was conducted at 14:21, prior to the start of the procedure. A Minimum amount of bleeding was controlled with Pressure. The procedure was tolerated well with a pain level of 0 throughout and a pain level of 0 following the procedure. Post Debridement Measurements: 0.5cm length x 0.5cm width x 0.1cm depth; 0.02cm^3 volume. Character of Wound/Ulcer Post Debridement requires further debridement. Severity of Tissue Post Debridement is: Fat layer exposed. Post procedure Diagnosis Wound #3: Same as Pre-Procedure General Notes: Scribed for Dr. Celine Ahr by Blanche East, RN. Plan Follow-up Appointments: Return Appointment in 1 week. - Dr. Celine Ahr Rm 3 Wednesday 03/06/22 at 3:15 PM Anesthetic: Wound #2 Left,Lateral Foot: (In clinic) Topical Lidocaine 5% applied to wound bed Wound #3 Left,Plantar Foot: (In clinic) Topical Lidocaine 5% applied to wound bed Bathing/ Shower/ Hygiene: May shower with protection but do not get wound dressing(s) wet. - Keep dressing dry at all times, apply a cast protector that can be purchased at [facility] or [facility] Edema Control - Lymphedema / SCD / Other: Elevate legs to the level of the heart or above for 30 minutes daily and/or when sitting, a frequency of: Avoid standing for long periods of time. Patient to wear own compression stockings every day. - Rt lower leg Moisturize legs daily. WOUND #2: - Foot Wound Laterality: Left, Lateral Cleanser: Soap and Water Discharge Instructions: May shower and wash wound with dial antibacterial soap and water prior to dressing change. Peri-Wound Care: Sween Lotion (Moisturizing lotion) Discharge Instructions: Apply moisturizing lotion as directed Prim Dressing: KerraCel Ag Gelling Fiber Dressing, 4x5 in (silver alginate) ary Discharge Instructions: Apply silver alginate to wound bed as instructed Secondary Dressing: ABD Pad, 8x10 Discharge Instructions: Apply over primary dressing as  directed. Secondary Dressing: Optifoam Non-Adhesive Dressing, 4x4 in Discharge Instructions: Apply over primary dressing as directed. Secured With: Transpore Surgical T ape, 2x10 (  in/yd) Discharge Instructions: Secure dressing with tape as directed. Com pression Wrap: ThreePress (3 layer compression wrap) Discharge Instructions: Apply three layer compression as directed. WOUND #3: - Foot Wound Laterality: Plantar, Left Cleanser: Soap and Water Discharge Instructions: May shower and wash wound with dial antibacterial soap and water prior to dressing change. Peri-Wound Care: Sween Lotion (Moisturizing lotion) Discharge Instructions: Apply moisturizing lotion as directed Prim Dressing: KerraCel Ag Gelling Fiber Dressing, 4x5 in (silver alginate) ary Discharge Instructions: Apply silver alginate to wound bed as instructed Secondary Dressing: ABD Pad, 8x10 Discharge Instructions: Apply over primary dressing as directed. Secondary Dressing: Optifoam Non-Adhesive Dressing, 4x4 in Discharge Instructions: Apply over primary dressing as directed. Secured With: Transpore Surgical T ape, 2x10 (in/yd) Discharge Instructions: Secure dressing with tape as directed. Com pression Wrap: ThreePress (3 layer compression wrap) Discharge Instructions: Apply three layer compression as directed. 02/28/2022: This is a 76 year old woman who has been seen in the wound care center for lower extremity ulcers. She does have chronic venous insufficiency and lower extremity edema. She recently developed bilateral lower extremity cellulitis and blisters. On inspection, she had apparently several wounds that have now healed. There is a lot of dry thickened skin around what looks like old blisters that have dried up. On the plantar surface at about the midfoot, there is a tiny opening in her skin with some associated periwound callus. On the lateral aspect of her midfoot, there is some dark skin with boggy tissue  underlying this. Underneath the darkened skin, there is a superficial ulcer limited to breakdown of skin. I used a combination of forceps, scissors, and a curette to remove the old dry skin and callus from her wounds. I also debrided some slough off of the lateral left midfoot. She has a couple of days left of the Bactrim that was prescribed by her PCP and she should complete that. We will apply silver alginate and 3 Sorce, Ohana Delgado ([id number]) [id number] Page 9 of 11 layer compression to her wounds. As I was leaving the exam room, she mentioned that she thinks she may have athlete's foot. The webspaces between her toes do look like she does have athlete's foot so I recommended she use an over-the-counter preparation for this. She will follow-up with a nurse visit next week and see me in 2 weeks' time Electronic Signature(s) Signed: 02/28/2022 2:40:23 PM By: Fredirick Maudlin MD FACS Entered By: Fredirick Maudlin on 02/28/2022 14:40:23 -------------------------------------------------------------------------------- HxROS Details Patient Name: Date of Service: Cheryl Delgado, Britney Delgado. 02/28/2022 1:30 PM Medical Record Number: [id number] Patient Account Number: [id number] Date of Birth/Sex: Treating RN: 02-Jun-1945 (76 y.o. Marta Lamas Primary Care Provider: Pricilla Holm Other Clinician: Referring Provider: Treating Provider/Extender: Durenda Age in Treatment: 0 Constitutional Symptoms (General Health) Complaints and Symptoms: Negative for: Fatigue; Fever; Chills; Marked Weight Change Medical History: Past Medical History Notes: Cellulitis x4 weeks ago failed oral abx therapy, inpatient for IV 03/02/2021-03/05/2021 Eyes Complaints and Symptoms: Negative for: Dry Eyes; Vision Changes; Glasses / Contacts Medical History: Positive for: Cataracts Negative for: Glaucoma; Optic Neuritis Past Medical History Notes: retinopathy  right eye Respiratory Complaints and Symptoms: Negative for: Chronic or frequent coughs; Shortness of Breath Medical History: Negative for: Aspiration; Asthma; Chronic Obstructive Pulmonary Disease (COPD); Pneumothorax; Sleep Apnea; Tuberculosis Cardiovascular Complaints and Symptoms: Negative for: Chest pain Medical History: Positive for: Hypertension Negative for: Angina; Arrhythmia; Congestive Heart Failure; Coronary Artery Disease; Deep Vein Thrombosis; Hypotension; Myocardial Infarction; Peripheral Arterial Disease; Peripheral Venous Disease; Phlebitis; Vasculitis Gastrointestinal  Complaints and Symptoms: Negative for: Frequent diarrhea; Nausea; Vomiting Medical History: Negative for: Cirrhosis ; Colitis; Crohns; Hepatitis A; Hepatitis Delgado; Hepatitis C Neurologic Complaints and Symptoms: Negative for: Numbness/parasthesias Medical History: Negative for: Dementia; Neuropathy; Quadriplegia; Paraplegia; Seizure Disorder LICET, DUNPHY ([id number]) [id number] Page 10 of 11 Ear/Nose/Mouth/Throat Medical History: Negative for: Chronic sinus problems/congestion; Middle ear problems Hematologic/Lymphatic Medical History: Negative for: Anemia; Hemophilia; Human Immunodeficiency Virus; Lymphedema; Sickle Cell Disease Endocrine Medical History: Positive for: Type II Diabetes Negative for: Type I Diabetes Time with diabetes: 30 years Treated with: Oral agents, Diet Blood sugar tested every day: No Genitourinary Medical History: Negative for: End Stage Renal Disease Immunological Medical History: Negative for: Lupus Erythematosus; Raynauds; Scleroderma Integumentary (Skin) Medical History: Negative for: History of Burn Musculoskeletal Medical History: Positive for: Osteoarthritis - lower back Negative for: Gout; Rheumatoid Arthritis; Osteomyelitis Oncologic Medical History: Negative for: Received Chemotherapy; Received  Radiation Psychiatric Medical History: Negative for: Anorexia/bulimia; Confinement Anxiety HBO Extended History Items Eyes: Cataracts Immunizations Pneumococcal Vaccine: Received Pneumococcal Vaccination: Yes Received Pneumococcal Vaccination On or After 60th Birthday: Yes Implantable Devices No devices added Hospitalization / Surgery History Type of Hospitalization/Surgery 03/02/2021-03/05/2021 Cellulitis hysterectomy Family and Social History Cancer: No; Diabetes: Yes - Mother; Heart Disease: Yes - Mother; Hereditary Spherocytosis: No; Hypertension: No; Kidney Disease: No; Lung Disease: No; Seizures: No; Stroke: No; Thyroid Problems: No; Tuberculosis: No; Current every day smoker - 6 cig a day; Marital Status - Married; Alcohol Use: Never; Drug Use: No History; Caffeine Use: Moderate - coffee; Financial Concerns: No; Food, Clothing or Shelter Needs: No; Support System Lacking: No; Transportation Concerns: No Physician Affirmation I have reviewed and agree with the above information. AMMI, HUTT ([id number]) [id number] Page 11 of 11 Electronic Signature(s) Signed: 02/28/2022 1:56:30 PM By: Fredirick Maudlin MD FACS Signed: 02/28/2022 4:56:15 PM By: Blanche East RN Entered By: Blanche East on 02/28/2022 13:49:22 -------------------------------------------------------------------------------- SuperBill Details Patient Name: Date of Service: Cheryl Delgado, Sihaam Delgado. 02/28/2022 Medical Record Number: [id number] Patient Account Number: [id number] Date of Birth/Sex: Treating RN: 04-Feb-1946 (76 y.o. F) Primary Care Provider: Pricilla Holm Other Clinician: Referring Provider: Treating Provider/Extender: Durenda Age in Treatment: 1 Diagnosis Coding ICD-10 Codes Code Description [phone] Non-pressure chronic ulcer of left heel and midfoot limited to breakdown of skin E11.621 Type 2 diabetes mellitus with foot ulcer I87.2  Venous insufficiency (chronic) (peripheral) Facility Procedures : CPT4 Code: [id number] Description: 27253 - WOUND CARE VISIT-LEV 3 EST PT Modifier: 25 Quantity: 1 : CPT4 Code: [id number] Description: 42595 - DEBRIDE WOUND 1ST 20 SQ CM OR < ICD-10 Diagnosis Description L97.421 Non-pressure chronic ulcer of left heel and midfoot limited to breakdown of ski Modifier: n Quantity: 1 Physician Procedures : CPT4 Code Description Modifier [id number] 33295 - WC PHYS LEVEL 4 - EST PT 25 ICD-10 Diagnosis Description L97.421 Non-pressure chronic ulcer of left heel and midfoot limited to breakdown of skin E11.621 Type 2 diabetes mellitus with foot ulcer I87.2  Venous insufficiency (chronic) (peripheral) Quantity: 1 : [id number] 06301 - WC PHYS DEBR WO ANESTH 20 SQ CM ICD-10 Diagnosis Description L97.421 Non-pressure chronic ulcer of left heel and midfoot limited to breakdown of skin Quantity: 1 Electronic Signature(s) Signed: 02/28/2022 4:56:15 PM By: Blanche East RN Signed: 03/01/2022 8:42:43 AM By: Fredirick Maudlin MD FACS Previous Signature: 02/28/2022 2:40:52 PM Version By: Fredirick Maudlin MD FACS Entered By: Blanche East on 02/28/2022 16:26:50

## 2022-03-06 ENCOUNTER — Encounter (HOSPITAL_BASED_OUTPATIENT_CLINIC_OR_DEPARTMENT_OTHER): Payer: Medicare HMO | Admitting: General Surgery

## 2022-03-06 ENCOUNTER — Other Ambulatory Visit: Payer: Self-pay | Admitting: Internal Medicine

## 2022-03-06 DIAGNOSIS — E11621 Type 2 diabetes mellitus with foot ulcer: Secondary | ICD-10-CM | POA: Diagnosis not present

## 2022-03-06 NOTE — Progress Notes (Signed)
AMARYS, SLIWINSKI ([id number]) [id number] Page 1 of 7 Visit Report for 03/06/2022 Chief Complaint Document Details Patient Name: Date of Service: Cheryl Delgado, Cheryl Delgado 03/06/2022 3:15 PM Medical Record Number: [id number] Patient Account Number: [id number] Date of Birth/Sex: Treating RN: 1945/06/20 (76 y.o. F) Primary Care Provider: Pricilla Holm Other Clinician: Referring Provider: Treating Provider/Extender: Durenda Age in Treatment: 0 Information Obtained from: Patient Chief Complaint Left lower extremity wound 02/28/2022: left foot ulcer Electronic Signature(s) Signed: 03/06/2022 3:55:26 PM By: Fredirick Maudlin MD FACS Entered By: Fredirick Maudlin on 03/06/2022 15:55:26 -------------------------------------------------------------------------------- HPI Details Patient Name: Date of Service: Cheryl Delgado, Cheryl Delgado. 03/06/2022 3:15 PM Medical Record Number: [id number] Patient Account Number: [id number] Date of Birth/Sex: Treating RN: [birth year] (76 y.o. F) Primary Care Provider: Pricilla Holm Other Clinician: Referring Provider: Treating Provider/Extender: Durenda Age in Treatment: 0 History of Present Illness HPI Description: Admission 03/16/2021 Cheryl Delgado is a 76 year old female with a past medical history of uncontrolled type 2 diabetes on oral agents with last hemoglobin A1c of 9, essential hypertension, chronic venous insufficiency and tobacco dependence that presents to the clinic for a 1 month history of left lower extremity wound that waxes and wanes. She was hospitalized on 03/02/2021 for left lower extremity cellulitis. She was treated with IV antibiotics and discharged with oral medication. She is currently not taking any antibiotics at this time. She reports improvement in her symptoms but continues to develop wounds to her lower extremity. She uses compression stockings daily.  She currently denies signs of infection. 03/23/2021; patient presents for follow-up. She tolerated the compression wrap well. She has no issues or complaints today. READMISSION 02/28/2022 This is a now 76 year old woman who was previously seen in our clinic for a left lower extremity wound. She has a history of venous insufficiency and recently developed swelling in her legs that resulted in a blister on her left foot. This ruptured and she now has a wound. She saw her primary care provider who gave her a course of Bactrim and also prescribed Silvadene for her to apply while she waited for her wound care visit. Her most recent hemoglobin A1c was 7.9% on December 07, 2021. Previous ABIs done about a year ago showed a right ABI of 1.05 with a TBI of 0.7 and a left ABI of 1.01 with a TBI of 0.61. On inspection, she had apparently several wounds that have now healed. There is a lot of dry thickened skin around what looks like old ulcers that have dried up. On the plantar surface at about the midfoot, there is a tiny opening in her skin with some associated periwound callus. On the lateral aspect of her midfoot, there is some dark skin with boggy tissue underlying this. Underneath the darkened skin, there is a superficial ulcer limited to breakdown of skin. 03/06/2022: All of her wounds are healed. Electronic Signature(s) Signed: 03/06/2022 3:55:47 PM By: Fredirick Maudlin MD FACS Entered By: Fredirick Maudlin on 03/06/2022 15:55:47 Cheryl Delgado ([id number]) [id number] Page 2 of 7 -------------------------------------------------------------------------------- Physical Exam Details Patient Name: Date of Service: Cheryl Delgado, Cheryl Delgado 03/06/2022 3:15 PM Medical Record Number: [id number] Patient Account Number: [id number] Date of Birth/Sex: Treating RN: 12/14/1945 (76 y.o. F) Primary Care Provider: Pricilla Holm Other Clinician: Referring Provider: Treating  Provider/Extender: Durenda Age in Treatment: 0 Constitutional . . . . No acute distress. Respiratory Normal work of breathing on room air. Notes 03/06/2022: All of her wounds are healed. Electronic Signature(s)  Signed: 03/06/2022 3:56:15 PM By: Fredirick Maudlin MD FACS Entered By: Fredirick Maudlin on 03/06/2022 15:56:15 -------------------------------------------------------------------------------- Physician Orders Details Patient Name: Date of Service: Cheryl Delgado, Cheryl Delgado. 03/06/2022 3:15 PM Medical Record Number: [id number] Patient Account Number: [id number] Date of Birth/Sex: Treating RN: 10-23-1945 (76 y.o. [place] Brown Primary Care Provider: Pricilla Holm Other Clinician: Referring Provider: Treating Provider/Extender: Durenda Age in Treatment: 0 Verbal / Phone Orders: No Diagnosis Coding ICD-10 Coding Code Description L97.421 Non-pressure chronic ulcer of left heel and midfoot limited to breakdown of skin E11.621 Type 2 diabetes mellitus with foot ulcer I87.2 Venous insufficiency (chronic) (peripheral) Discharge From [facility] Discharge from [place] your wounds are healed!! For Moiturizing feet/legs please use Aquaphor, Eucerin etc. Electronic Signature(s) Signed: 03/06/2022 4:54:15 PM By: Dellie Catholic RN Signed: 03/06/2022 5:15:30 PM By: Fredirick Maudlin MD FACS Entered By: Dellie Catholic on 03/06/2022 15:57:10 Cheryl Delgado ([id number]) [id number] Page 3 of 7 -------------------------------------------------------------------------------- Problem List Details Patient Name: Date of Service: Cheryl Delgado, Cheryl Delgado 03/06/2022 3:15 PM Medical Record Number: [id number] Patient Account Number: [id number] Date of Birth/Sex: Treating RN: [birth year] (76 y.o. F) Primary Care Provider: Pricilla Holm Other Clinician: Referring  Provider: Treating Provider/Extender: Durenda Age in Treatment: 0 Active Problems ICD-10 Encounter Code Description Active Date MDM Diagnosis L97.421 Non-pressure chronic ulcer of left heel and midfoot limited to breakdown of 02/28/2022 No Yes skin E11.621 Type 2 diabetes mellitus with foot ulcer 02/28/2022 No Yes I87.2 Venous insufficiency (chronic) (peripheral) 02/28/2022 No Yes Inactive Problems Resolved Problems Electronic Signature(s) Signed: 03/06/2022 3:55:15 PM By: Fredirick Maudlin MD FACS Entered By: Fredirick Maudlin on 03/06/2022 15:55:14 -------------------------------------------------------------------------------- Progress Note Details Patient Name: Date of Service: Cheryl Delgado, Cheryl Delgado. 03/06/2022 3:15 PM Medical Record Number: [id number] Patient Account Number: [id number] Date of Birth/Sex: Treating RN: Mar 01, 1946 (76 y.o. F) Primary Care Provider: Pricilla Holm Other Clinician: Referring Provider: Treating Provider/Extender: Durenda Age in Treatment: 0 Subjective Chief Complaint Information obtained from Patient Left lower extremity wound 02/28/2022: left foot ulcer History of Present Illness (HPI) Admission 03/16/2021 Ms. Rozanne Heumann is a 76 year old female with a past medical history of uncontrolled type 2 diabetes on oral agents with last hemoglobin A1c of 9, essential hypertension, chronic venous insufficiency and tobacco dependence that presents to the clinic for a 1 month history of left lower extremity wound that waxes and wanes. She was hospitalized on 03/02/2021 for left lower extremity cellulitis. She was treated with IV antibiotics and discharged with oral medication. She is currently not taking any antibiotics at this time. She reports improvement in her symptoms but continues to develop wounds to her lower extremity. She uses compression stockings daily. She currently denies signs of  infection. 03/23/2021; patient presents for follow-up. She tolerated the compression wrap well. She has no issues or complaints today. READMISSION 02/28/2022 This is a now 76 year old woman who was previously seen in our clinic for a left lower extremity wound. She has a history of venous insufficiency and recently developed swelling in her legs that resulted in a blister on her left foot. This ruptured and she now has a wound. She saw her primary care provider who gave her a course of Bactrim and also prescribed Silvadene for her to apply while she waited for her wound care visit. Her most recent hemoglobin A1c was 7.9% on December 07, 2021. Previous ABIs done about a year ago showed a right ABI of 1.05 with a TBI of 0.7  and a left ABI of 1.01 with a TBI of 0.61. Cheryl Delgado, Cheryl ([id number]) [id number] Page 4 of 7 On inspection, she had apparently several wounds that have now healed. There is a lot of dry thickened skin around what looks like old ulcers that have dried up. On the plantar surface at about the midfoot, there is a tiny opening in her skin with some associated periwound callus. On the lateral aspect of her midfoot, there is some dark skin with boggy tissue underlying this. Underneath the darkened skin, there is a superficial ulcer limited to breakdown of skin. 03/06/2022: All of her wounds are healed. Patient History Family History Diabetes - Mother, Heart Disease - Mother, No family history of Cancer, Hereditary Spherocytosis, Hypertension, Kidney Disease, Lung Disease, Seizures, Stroke, Thyroid Problems, Tuberculosis. Social History Current every day smoker - 6 cig a day, Marital Status - Married, Alcohol Use - Never, Drug Use - No History, Caffeine Use - Moderate - coffee. Medical History Eyes Patient has history of Cataracts Denies history of Glaucoma, Optic Neuritis Ear/Nose/Mouth/Throat Denies history of Chronic sinus problems/congestion, Middle  ear problems Hematologic/Lymphatic Denies history of Anemia, Hemophilia, Human Immunodeficiency Virus, Lymphedema, Sickle Cell Disease Respiratory Denies history of Aspiration, Asthma, Chronic Obstructive Pulmonary Disease (COPD), Pneumothorax, Sleep Apnea, Tuberculosis Cardiovascular Patient has history of Hypertension Denies history of Angina, Arrhythmia, Congestive Heart Failure, Coronary Artery Disease, Deep Vein Thrombosis, Hypotension, Myocardial Infarction, Peripheral Arterial Disease, Peripheral Venous Disease, Phlebitis, Vasculitis Gastrointestinal Denies history of Cirrhosis , Colitis, Crohnoos, Hepatitis A, Hepatitis Delgado, Hepatitis C Endocrine Patient has history of Type II Diabetes Denies history of Type I Diabetes Genitourinary Denies history of End Stage Renal Disease Immunological Denies history of Lupus Erythematosus, Raynaudoos, Scleroderma Integumentary (Skin) Denies history of History of Burn Musculoskeletal Patient has history of Osteoarthritis - lower back Denies history of Gout, Rheumatoid Arthritis, Osteomyelitis Neurologic Denies history of Dementia, Neuropathy, Quadriplegia, Paraplegia, Seizure Disorder Oncologic Denies history of Received Chemotherapy, Received Radiation Psychiatric Denies history of Anorexia/bulimia, Confinement Anxiety Hospitalization/Surgery History - 03/02/2021-03/05/2021 Cellulitis. - hysterectomy. Medical A Surgical History Notes nd Constitutional Symptoms (General Health) Cellulitis x4 weeks ago failed oral abx therapy, inpatient for IV 03/02/2021-03/05/2021 Eyes retinopathy right eye Objective Constitutional No acute distress. Vitals Time Taken: 3:10 AM, Height: 62 in, Weight: 223 lbs, BMI: 40.8, Temperature: 98.4 F, Pulse: 84 bpm, Respiratory Rate: 20 breaths/min, Blood Pressure: 104/67 mmHg. Respiratory Normal work of breathing on room air. General Notes: 03/06/2022: All of her wounds are healed. Integumentary (Hair,  Skin) Wound #2 status is Healed - Epithelialized. Original cause of wound was Blister. The date acquired was: 02/15/2022. The wound is located on the Left,Lateral Foot. The wound measures 0cm length x 0cm width x 0cm depth; 0cm^2 area and 0cm^3 volume. There is no tunneling or undermining noted. There is a none present amount of drainage noted. There is no granulation within the wound bed. There is no necrotic tissue within the wound bed. The periwound skin appearance had no abnormalities noted for texture. The periwound skin appearance had no abnormalities noted for moisture. The periwound skin appearance had no abnormalities noted for color. Periwound temperature was noted as No Abnormality. Wound #3 status is Healed - Epithelialized. Original cause of wound was Blister. The date acquired was: 02/15/2022. The wound is located on the Cheryl Delgado, Cheryl Delgado ([id number]) [id number] Page 5 of 7 Foot. The wound measures 0cm length x 0cm width x 0cm depth; 0cm^2 area and 0cm^3 volume. There is no tunneling or undermining noted. There  is a medium amount of drainage noted. The wound margin is flat and intact. There is no granulation within the wound bed. There is no necrotic tissue within the wound bed. The periwound skin appearance had no abnormalities noted for texture. The periwound skin appearance had no abnormalities noted for moisture. The periwound skin appearance had no abnormalities noted for color. Periwound temperature was noted as No Abnormality. Assessment Active Problems ICD-10 Non-pressure chronic ulcer of left heel and midfoot limited to breakdown of skin Type 2 diabetes mellitus with foot ulcer Venous insufficiency (chronic) (peripheral) Plan Discharge From [facility] Services: Discharge from [place] your wounds are healed!! For Moiturizing feet/legs please use Aquaphor, Eucerin etc. 03/06/2022: All of her wounds are healed. She has  compression stockings and she was reminded to wear them religiously. Her skin is very dry, particularly on her feet and I suggested that she apply a thick emollient agent, such as Aquaphor, Eucerin, or even Vaseline and wear a sock at night to try and get better moisturization of the skin on her feet. We will discharge her from the wound care center. She may follow-up as needed. Electronic Signature(s) Signed: 03/06/2022 4:28:04 PM By: Fredirick Maudlin MD FACS Entered By: Fredirick Maudlin on 03/06/2022 16:28:03 -------------------------------------------------------------------------------- HxROS Details Patient Name: Date of Service: Cheryl Delgado, Cheryl Delgado. 03/06/2022 3:15 PM Medical Record Number: [id number] Patient Account Number: [id number] Date of Birth/Sex: Treating RN: 07-09-1945 (76 y.o. F) Primary Care Provider: Pricilla Holm Other Clinician: Referring Provider: Treating Provider/Extender: Durenda Age in Treatment: 0 Constitutional Symptoms (General Health) Medical History: Past Medical History Notes: Cellulitis x4 weeks ago failed oral abx therapy, inpatient for IV 03/02/2021-03/05/2021 Eyes Medical History: Positive for: Cataracts Negative for: Glaucoma; Optic Neuritis Past Medical History Notes: retinopathy right eye Ear/Nose/Mouth/Throat Medical History: Negative for: Chronic sinus problems/congestion; Middle ear problems Hematologic/Lymphatic Medical History: Negative for: Anemia; Hemophilia; Human Immunodeficiency Virus; Lymphedema; Sickle Cell Disease Cheryl Delgado, Cheryl Delgado ([id number]) [id number] Page 6 of 7 Respiratory Medical History: Negative for: Aspiration; Asthma; Chronic Obstructive Pulmonary Disease (COPD); Pneumothorax; Sleep Apnea; Tuberculosis Cardiovascular Medical History: Positive for: Hypertension Negative for: Angina; Arrhythmia; Congestive Heart Failure; Coronary Artery Disease; Deep Vein  Thrombosis; Hypotension; Myocardial Infarction; Peripheral Arterial Disease; Peripheral Venous Disease; Phlebitis; Vasculitis Gastrointestinal Medical History: Negative for: Cirrhosis ; Colitis; Crohns; Hepatitis A; Hepatitis Delgado; Hepatitis C Endocrine Medical History: Positive for: Type II Diabetes Negative for: Type I Diabetes Time with diabetes: 30 years Treated with: Oral agents, Diet Blood sugar tested every day: No Genitourinary Medical History: Negative for: End Stage Renal Disease Immunological Medical History: Negative for: Lupus Erythematosus; Raynauds; Scleroderma Integumentary (Skin) Medical History: Negative for: History of Burn Musculoskeletal Medical History: Positive for: Osteoarthritis - lower back Negative for: Gout; Rheumatoid Arthritis; Osteomyelitis Neurologic Medical History: Negative for: Dementia; Neuropathy; Quadriplegia; Paraplegia; Seizure Disorder Oncologic Medical History: Negative for: Received Chemotherapy; Received Radiation Psychiatric Medical History: Negative for: Anorexia/bulimia; Confinement Anxiety HBO Extended History Items Eyes: Cataracts Immunizations Pneumococcal Vaccine: Received Pneumococcal Vaccination: Yes Received Pneumococcal Vaccination On or After 60th Birthday: Yes Implantable Devices No devices added Hospitalization / Surgery History Type of Hospitalization/Surgery LORISA, SCHEID Delgado ([id number]) [id number] Page 7 of 7 03/02/2021-03/05/2021 Cellulitis hysterectomy Family and Social History Cancer: No; Diabetes: Yes - Mother; Heart Disease: Yes - Mother; Hereditary Spherocytosis: No; Hypertension: No; Kidney Disease: No; Lung Disease: No; Seizures: No; Stroke: No; Thyroid Problems: No; Tuberculosis: No; Current every day smoker - 6 cig a day; Marital Status - Married; Alcohol Use: Never; Drug  Use: No History; Caffeine Use: Moderate - coffee; Financial Concerns: No; Food, Clothing or Shelter  Needs: No; Support System Lacking: No; Transportation Concerns: No Electronic Signature(s) Signed: 03/06/2022 5:15:30 PM By: Fredirick Maudlin MD FACS Entered By: Fredirick Maudlin on 03/06/2022 15:55:52 -------------------------------------------------------------------------------- SuperBill Details Patient Name: Date of Service: Cheryl Delgado, Vinnie Delgado. 03/06/2022 Medical Record Number: [id number] Patient Account Number: [id number] Date of Birth/Sex: Treating RN: Apr 04, 1946 (76 y.o. F) Primary Care Provider: Pricilla Holm Other Clinician: Referring Provider: Treating Provider/Extender: Durenda Age in Treatment: 0 Diagnosis Coding ICD-10 Codes Code Description [phone] Non-pressure chronic ulcer of left heel and midfoot limited to breakdown of skin E11.621 Type 2 diabetes mellitus with foot ulcer I87.2 Venous insufficiency (chronic) (peripheral) Facility Procedures : CPT4 Code: [id number] Description: 99213 - WOUND CARE VISIT-LEV 3 EST PT Modifier: Quantity: 1 Physician Procedures : CPT4 Code Description Modifier [id number] - WC PHYS LEVEL 3 - EST PT ICD-10 Diagnosis Description L97.421 Non-pressure chronic ulcer of left heel and midfoot limited to breakdown of skin E11.621 Type 2 diabetes mellitus with foot ulcer I87.2 Venous  insufficiency (chronic) (peripheral) Quantity: 1 Electronic Signature(s) Signed: 03/06/2022 4:54:15 PM By: Dellie Catholic RN Signed: 03/06/2022 5:15:30 PM By: Fredirick Maudlin MD FACS Previous Signature: 03/06/2022 4:28:57 PM Version By: Fredirick Maudlin MD FACS Entered By: Dellie Catholic on 03/06/2022 16:41:03

## 2022-03-07 NOTE — Progress Notes (Signed)
Cheryl Delgado, Cheryl Delgado ([id number]) [id number] Page 1 of 8 Visit Report for 03/06/2022 Arrival Information Details Patient Name: Date of Service: Cheryl Delgado, Cheryl Delgado 03/06/2022 3:15 PM Medical Record Number: [id number] Patient Account Number: [id number] Date of Birth/Sex: Treating RN: 04-14-1946 (76 y.o. F) Primary Care Cheryl Delgado: Pricilla Holm Other Clinician: Referring Cheryl Delgado: Treating Cheryl Delgado/Extender: Durenda Age in Treatment: 0 Visit Information History Since Last Visit All ordered tests and consults were completed: No Patient Arrived: Ambulatory Added or deleted any medications: No Arrival Time: 15:10 Any new allergies or adverse reactions: No Accompanied By: self Had a fall or experienced change in No Transfer Assistance: None activities of daily living that may affect Patient Identification Verified: Yes risk of falls: Secondary Verification Process Completed: Yes Signs or symptoms of abuse/neglect since last visito No Patient Requires Transmission-Based Precautions: No Hospitalized since last visit: No Patient Has Alerts: No Implantable device outside of the clinic excluding No cellular tissue based products placed in the center since last visit: Pain Present Now: No Electronic Signature(s) Signed: 03/06/2022 3:51:56 PM By: Worthy Rancher Entered By: Worthy Rancher on 03/06/2022 15:10:43 -------------------------------------------------------------------------------- Clinic Level of Care Assessment Details Patient Name: Date of Service: Cheryl Delgado 03/06/2022 3:15 PM Medical Record Number: [id number] Patient Account Number: [id number] Date of Birth/Sex: Treating RN: 1945-07-19 (76 y.o. America Brown Primary Care Cheryl Delgado: Pricilla Holm Other Clinician: Referring Cheryl Delgado: Treating Cheryl Delgado/Extender: Durenda Age in Treatment: 0 Clinic Level of Care Assessment  Items TOOL 4 Quantity Score X- 1 0 Use when only an EandM is performed on FOLLOW-UP visit ASSESSMENTS - Nursing Assessment / Reassessment X- 1 10 Reassessment of Co-morbidities (includes updates in patient status) X- 1 5 Reassessment of Adherence to Treatment Plan ASSESSMENTS - Wound and Skin A ssessment / Reassessment '[]'$  - 0 Simple Wound Assessment / Reassessment - one wound X- 2 5 Complex Wound Assessment / Reassessment - multiple wounds '[]'$  - 0 Dermatologic / Skin Assessment (not related to wound area) ASSESSMENTS - Focused Assessment '[]'$  - 0 Circumferential Edema Measurements - multi extremities '[]'$  - 0 Nutritional Assessment / Counseling / Intervention Cheryl Delgado, Cheryl Delgado ([id number]) [id number] Page 2 of 8 '[]'$  - 0 Lower Extremity Assessment (monofilament, tuning fork, pulses) '[]'$  - 0 Peripheral Arterial Disease Assessment (using hand held doppler) ASSESSMENTS - Ostomy and/or Continence Assessment and Care '[]'$  - 0 Incontinence Assessment and Management '[]'$  - 0 Ostomy Care Assessment and Management (repouching, etc.) PROCESS - Coordination of Care '[]'$  - 0 Simple Patient / Family Education for ongoing care '[]'$  - 0 Complex (extensive) Patient / Family Education for ongoing care X- 1 10 Staff obtains Programmer, systems, Records, T Results / Process Orders est X- 1 10 Staff telephones HHA, Nursing Homes / Clarify orders / etc '[]'$  - 0 Routine Transfer to another Facility (non-emergent condition) '[]'$  - 0 Routine Hospital Admission (non-emergent condition) '[]'$  - 0 New Admissions / Biomedical engineer / Ordering NPWT Apligraf, etc. , '[]'$  - 0 Emergency Hospital Admission (emergent condition) X- 1 10 Simple Discharge Coordination '[]'$  - 0 Complex (extensive) Discharge Coordination PROCESS - Special Needs '[]'$  - 0 Pediatric / Minor Patient Management '[]'$  - 0 Isolation Patient Management '[]'$  - 0 Hearing / Language / Visual special needs '[]'$  - 0 Assessment of  Community assistance (transportation, D/C planning, etc.) '[]'$  - 0 Additional assistance / Altered mentation '[]'$  - 0 Support Surface(s) Assessment (bed, cushion, seat, etc.) INTERVENTIONS - Wound Cleansing / Measurement '[]'$  - 0 Simple Wound Cleansing - one wound X-  2 5 Complex Wound Cleansing - multiple wounds X- 1 5 Wound Imaging (photographs - any number of wounds) '[]'$  - 0 Wound Tracing (instead of photographs) '[]'$  - 0 Simple Wound Measurement - one wound X- 2 5 Complex Wound Measurement - multiple wounds INTERVENTIONS - Wound Dressings '[]'$  - 0 Small Wound Dressing one or multiple wounds '[]'$  - 0 Medium Wound Dressing one or multiple wounds '[]'$  - 0 Large Wound Dressing one or multiple wounds '[]'$  - 0 Application of Medications - topical '[]'$  - 0 Application of Medications - injection INTERVENTIONS - Miscellaneous '[]'$  - 0 External ear exam '[]'$  - 0 Specimen Collection (cultures, biopsies, blood, body fluids, etc.) '[]'$  - 0 Specimen(s) / Culture(s) sent or taken to Lab for analysis '[]'$  - 0 Patient Transfer (multiple staff / Civil Service fast streamer / Similar devices) '[]'$  - 0 Simple Staple / Suture removal (25 or less) '[]'$  - 0 Complex Staple / Suture removal (26 or more) '[]'$  - 0 Hypo / Hyperglycemic Management (close monitor of Blood Glucose) Cheryl Delgado, Cheryl Delgado ([id number]) [id number] Page 3 of 8 '[]'$  - 0 Ankle / Brachial Index (ABI) - do not check if billed separately X- 1 5 Vital Signs Has the patient been seen at the hospital within the last three years: Yes Total Score: 85 Level Of Care: New/Established - Level 3 Electronic Signature(s) Signed: 03/06/2022 4:54:15 PM By: Dellie Catholic RN Entered By: Dellie Catholic on 03/06/2022 16:40:51 -------------------------------------------------------------------------------- Encounter Discharge Information Details Patient Name: Date of Service: Cheryl Delgado, Cheryl Delgado. 03/06/2022 3:15 PM Medical Record Number: [id number] Patient  Account Number: [id number] Date of Birth/Sex: Treating RN: 30-Nov-1945 (76 y.o. America Brown Primary Care Cristi Gwynn: Pricilla Holm Other Clinician: Referring Edia Pursifull: Treating Sueann Brownley/Extender: Durenda Age in Treatment: 0 Encounter Discharge Information Items Discharge Condition: Stable Ambulatory Status: Cane Discharge Destination: Home Transportation: Private Auto Accompanied By: self Schedule Follow-up Appointment: Yes Clinical Summary of Care: Patient Declined Electronic Signature(s) Signed: 03/06/2022 4:54:15 PM By: Dellie Catholic RN Entered By: Dellie Catholic on 03/06/2022 16:42:39 -------------------------------------------------------------------------------- Lower Extremity Assessment Details Patient Name: Date of Service: Cheryl Delgado, Cheryl Delgado. 03/06/2022 3:15 PM Medical Record Number: [id number] Patient Account Number: [id number] Date of Birth/Sex: Treating RN: [birth year] (76 y.o. America Brown Primary Care Crystale Giannattasio: Pricilla Holm Other Clinician: Referring Tayden Nichelson: Treating Tailey Top/Extender: Durenda Age in Treatment: 0 Edema Assessment Assessed: [Left: No] [Right: No] [Left: Edema] [Right: :] Calf Left: Right: Point of Measurement: From Medial Instep 42 cm Ankle Left: Right: Point of Measurement: From Medial Instep 32 cm Vascular Assessment Huge, Beena Delgado ([id number]) [Right:[id number] Page 4 of 8] Pulses: Dorsalis Pedis Palpable: [Left:Yes] Electronic Signature(s) Signed: 03/06/2022 4:54:15 PM By: Dellie Catholic RN Entered By: Dellie Catholic on 03/06/2022 15:42:03 -------------------------------------------------------------------------------- Multi Wound Chart Details Patient Name: Date of Service: Cheryl Delgado, Cheryl Delgado. 03/06/2022 3:15 PM Medical Record Number: [id number] Patient Account Number: [id number] Date of Birth/Sex: Treating RN: 1945-09-08  (76 y.o. F) Primary Care Marchetta Navratil: Pricilla Holm Other Clinician: Referring Wolf Boulay: Treating Oma Alpert/Extender: Durenda Age in Treatment: 0 Vital Signs Height(in): 59 Pulse(bpm): 10 Weight(lbs): 778 Blood Pressure(mmHg): 104/67 Body Mass Index(BMI): 40.8 Temperature(F): 98.4 Respiratory Rate(breaths/min): 20 [2:Photos: No Photos Left, Lateral Foot Wound Location: Blister Wounding Event: Diabetic Wound/Ulcer of the Lower Primary Etiology: Extremity Cataracts, Hypertension, Type II Comorbid History: Diabetes, Osteoarthritis 02/15/2022 Date Acquired: 0 Weeks of Treatment:  Healed - Epithelialized Wound Status: No Wound Recurrence: 0x0x0 Measurements L x W x D (cm) 0 A (cm) : rea 0 Volume (  cm) : 100.00% % Reduction in A rea: 100.00% % Reduction in Volume: Grade 1 Classification: None Present Exudate A mount: N/A Wound  Margin: None Present (0%) Granulation A mount: N/A Granulation Quality: None Present (0%) Necrotic A mount: N/A Necrotic Tissue: Fascia: No Exposed Structures: Fat Layer (Subcutaneous Tissue): No Tendon: No Muscle: No Joint: No Bone: No None  Epithelialization: No Abnormalities Noted Periwound Skin Texture: Dry/Scaly: Yes Periwound Skin Moisture: No Abnormalities Noted Periwound Skin Color: No Abnormality Temperature:] [3:No Photos Left, Plantar Foot Blister Diabetic Wound/Ulcer of the Lower  Extremity Cataracts, Hypertension, Type II Diabetes, Osteoarthritis 02/15/2022 0 Open No 0.2x0.2x0.1 0.031 0.003 84.20% 85.00% Grade 1 Medium Flat and Intact Medium (34-66%) Pink Medium (34-66%) Eschar, Adherent Slough N/A None No Abnormalities Noted  Dry/Scaly: Yes No Abnormalities Noted No Abnormality] [N/A:N/A N/A N/A N/A N/A N/A N/A N/A N/A N/A N/A N/A N/A N/A N/A N/A N/A N/A N/A N/A N/A N/A N/A N/A N/A N/A N/A] Treatment Notes Electronic Signature(s) Signed: 03/06/2022 3:55:21 PM By: Fredirick Maudlin MD FACS Entered By: Fredirick Maudlin on  03/06/2022 15:55:20 [name] ([id number]) [id number] Page 5 of 8 -------------------------------------------------------------------------------- Multi-Disciplinary Care Plan Details Patient Name: Date of Service: Cheryl Delgado, Cheryl Delgado 03/06/2022 3:15 PM Medical Record Number: [id number] Patient Account Number: [id number] Date of Birth/Sex: Treating RN: Nov 20, 1945 (76 y.o. America Brown Primary Care Karole Oo: Pricilla Holm Other Clinician: Referring Ben Sanz: Treating Gabrielle Mester/Extender: Durenda Age in Treatment: 0 Active Inactive Electronic Signature(s) Signed: 03/06/2022 4:54:15 PM By: Dellie Catholic RN Entered By: Dellie Catholic on 03/06/2022 16:36:57 -------------------------------------------------------------------------------- Pain Assessment Details Patient Name: Date of Service: Cheryl Delgado, Cheryl Delgado. 03/06/2022 3:15 PM Medical Record Number: [id number] Patient Account Number: [id number] Date of Birth/Sex: Treating RN: 05-15-1945 (76 y.o. F) Primary Care Tally Mckinnon: Pricilla Holm Other Clinician: Referring Ziair Penson: Treating Jerrel Tiberio/Extender: Durenda Age in Treatment: 0 Active Problems Location of Pain Severity and Description of Pain Patient Has Paino No Site Locations Pain Management and Medication Current Pain Management: Electronic Signature(s) Signed: 03/06/2022 3:51:56 PM By: Worthy Rancher Entered By: Worthy Rancher on 03/06/2022 15:11:23 [name] ([id number]) [id number] Page 6 of 8 -------------------------------------------------------------------------------- Patient/Caregiver Education Details Patient Name: Date of Service: Cheryl Delgado, Cheryl Delgado 11/22/2023andnbsp3:15 PM Medical Record Number: [id number] Patient Account Number: [id number] Date of Birth/Gender: Treating RN: Jan 29, 1946 (76 y.o. America Brown Primary Care Physician:  Pricilla Holm Other Clinician: Referring Physician: Treating Physician/Extender: Durenda Age in Treatment: 0 Education Assessment Education Provided To: Patient Education Topics Provided Wound/Skin Impairment: Methods: Explain/Verbal Responses: Return demonstration correctly Electronic Signature(s) Signed: 03/06/2022 4:54:15 PM By: Dellie Catholic RN Entered By: Dellie Catholic on 03/06/2022 16:37:15 -------------------------------------------------------------------------------- Wound Assessment Details Patient Name: Date of Service: Cheryl Delgado, Cheryl Delgado. 03/06/2022 3:15 PM Medical Record Number: [id number] Patient Account Number: [id number] Date of Birth/Sex: Treating RN: 1945/11/27 (76 y.o. America Brown Primary Care Sharon Stapel: Pricilla Holm Other Clinician: Referring Ketih Goodie: Treating Shariff Lasky/Extender: Durenda Age in Treatment: 0 Wound Status Wound Number: 2 Primary Etiology: Diabetic Wound/Ulcer of the Lower Extremity Wound Location: Left, Lateral Foot Wound Status: Healed - Epithelialized Wounding Event: Blister Comorbid History: Cataracts, Hypertension, Type II Diabetes, Osteoarthritis Date Acquired: 02/15/2022 Weeks Of Treatment: 0 Clustered Wound: No Wound Measurements Length: (cm) Width: (cm) Depth: (cm) Area: (cm) Volume: (cm) 0 % Reduction in Area: 100% 0 % Reduction in Volume: 100% 0 Epithelialization: None 0 Tunneling: No 0 Undermining: No Wound Description Classification: Grade 1 Exudate Amount: None Present Foul Odor After  Cleansing: No Slough/Fibrino No Wound Bed Granulation Amount: None Present (0%) Exposed Structure Necrotic Amount: None Present (0%) Fascia Exposed: No Fat Layer (Subcutaneous Tissue) Exposed: No Tendon Exposed: No Ley, Dessie Delgado ([id number]) [id number] Page 7 of 8 Muscle Exposed: No Joint Exposed: No Bone Exposed:  No Periwound Skin Texture Texture Color No Abnormalities Noted: Yes No Abnormalities Noted: Yes Moisture Temperature / Pain No Abnormalities Noted: Yes Temperature: No Abnormality Electronic Signature(s) Signed: 03/06/2022 4:54:15 PM By: Dellie Catholic RN Entered By: Dellie Catholic on 03/06/2022 15:55:13 -------------------------------------------------------------------------------- Wound Assessment Details Patient Name: Date of Service: Cheryl Delgado, PIPPENGER Delgado. 03/06/2022 3:15 PM Medical Record Number: [id number] Patient Account Number: [id number] Date of Birth/Sex: Treating RN: [birth year] (76 y.o. America Brown Primary Care Trent Theisen: Pricilla Holm Other Clinician: Referring Venora Kautzman: Treating Dawnisha Marquina/Extender: Durenda Age in Treatment: 0 Wound Status Wound Number: 3 Primary Etiology: Diabetic Wound/Ulcer of the Lower Extremity Wound Location: Left, Plantar Foot Wound Status: Healed - Epithelialized Wounding Event: Blister Comorbid History: Cataracts, Hypertension, Type II Diabetes, Osteoarthritis Date Acquired: 02/15/2022 Weeks Of Treatment: 0 Clustered Wound: No Wound Measurements Length: (cm) Width: (cm) Depth: (cm) Area: (cm) Volume: (cm) 0 % Reduction in Area: 100% 0 % Reduction in Volume: 100% 0 Epithelialization: Large (67-100%) 0 Tunneling: No 0 Undermining: No Wound Description Classification: Grade 1 Wound Margin: Flat and Intact Exudate Amount: Medium Foul Odor After Cleansing: No Slough/Fibrino No Wound Bed Granulation Amount: None Present (0%) Exposed Structure Necrotic Amount: None Present (0%) Fascia Exposed: No Fat Layer (Subcutaneous Tissue) Exposed: No Tendon Exposed: No Muscle Exposed: No Joint Exposed: No Bone Exposed: No Periwound Skin Texture Texture Color No Abnormalities Noted: Yes No Abnormalities Noted: Yes Moisture Temperature / Pain No Abnormalities Noted: Yes Temperature: No  Abnormality Electronic Signature(s) Signed: 03/06/2022 4:54:15 PM By: Dellie Catholic RN Entered By: Dellie Catholic on 03/06/2022 15:55:48 [name] ([id number]) [id number] Page 8 of 8 -------------------------------------------------------------------------------- Vitals Details Patient Name: Date of Service: DIAN[place], [place] 03/06/2022 3:15 PM Medical Record Number: [id number] Patient Account Number: [id number] Date of Birth/Sex: Treating RN: Jul 08, 1945 (76 y.o. F) Primary Care Opie Fanton: Pricilla Holm Other Clinician: Referring Chenee Munns: Treating Nilton Lave/Extender: Durenda Age in Treatment: 0 Vital Signs Time Taken: 03:10 Temperature (F): 98.4 Height (in): 62 Pulse (bpm): 84 Weight (lbs): 223 Respiratory Rate (breaths/min): 20 Body Mass Index (BMI): 40.8 Blood Pressure (mmHg): 104/67 Reference Range: 80 - 120 mg / dl Electronic Signature(s) Signed: 03/06/2022 3:51:56 PM By: Worthy Rancher Entered By: Worthy Rancher on 03/06/2022 15:11:08

## 2022-03-26 ENCOUNTER — Other Ambulatory Visit: Payer: Self-pay | Admitting: Internal Medicine

## 2022-04-04 ENCOUNTER — Other Ambulatory Visit: Payer: Self-pay | Admitting: Internal Medicine

## 2022-04-09 ENCOUNTER — Other Ambulatory Visit: Payer: Self-pay | Admitting: Internal Medicine

## 2022-05-07 ENCOUNTER — Telehealth: Payer: Self-pay | Admitting: Internal Medicine

## 2022-05-07 MED ORDER — AMOXICILLIN-POT CLAVULANATE 875-125 MG PO TABS: 1.0000 | ORAL_TABLET | Freq: Two times a day (BID) | ORAL | 0 refills | Status: DC

## 2022-05-07 NOTE — Telephone Encounter (Signed)
Informed patient about her antibiotics being called in. And to give [place] a call back if symptoms worsen

## 2022-05-07 NOTE — Telephone Encounter (Signed)
Sent in antibiotic to start today and should have visit in 5-7 days. Sooner if no improvement or if worsening.

## 2022-05-07 NOTE — Telephone Encounter (Signed)
Patient states that she is having some leg swelling like she has had in the past when she has had cellulitis. She states that in the past Dr. Sharlet Salina sent an antibiotic to the pharmacy. She is requesting antibiotic be sent to [facility] [place]  If patient needs appointment she requests call back at [phone].

## 2022-07-30 ENCOUNTER — Telehealth: Payer: Self-pay | Admitting: Internal Medicine

## 2022-07-30 MED ORDER — TORSEMIDE 20 MG PO TABS: 20.0000 mg | ORAL_TABLET | Freq: Every day | ORAL | 0 refills | Status: DC

## 2022-07-30 NOTE — Telephone Encounter (Signed)
Sent 90 day to [facility].Marland KitchenRaechel Chute

## 2022-07-30 NOTE — Telephone Encounter (Signed)
Prescription Request  07/30/2022  LOV: 02/15/2022  What is the name of the medication or equipment? torsemide (DEMADEX) 20 MG tablet   Have you contacted your pharmacy to request a refill? No   Which pharmacy would you like this sent to?  [id number] - [place], [place] - [address] AT [facility] [address] [place] [address] Phone: [phone] Fax: [phone]    Patient notified that their request is being sent to the clinical staff for review and that they should receive a response within 2 business days.   Please advise at Mobile [phone] (mobile)

## 2022-08-05 ENCOUNTER — Ambulatory Visit (INDEPENDENT_AMBULATORY_CARE_PROVIDER_SITE_OTHER): Payer: Medicare HMO | Admitting: Family Medicine

## 2022-08-05 ENCOUNTER — Encounter: Payer: Self-pay | Admitting: Family Medicine

## 2022-08-05 VITALS — BP 108/58 | HR 62 | Temp 98.3°F | Resp 20 | Ht 62.0 in | Wt 233.0 lb

## 2022-08-05 DIAGNOSIS — L988 Other specified disorders of the skin and subcutaneous tissue: Secondary | ICD-10-CM | POA: Diagnosis not present

## 2022-08-05 DIAGNOSIS — I872 Venous insufficiency (chronic) (peripheral): Secondary | ICD-10-CM | POA: Diagnosis not present

## 2022-08-05 DIAGNOSIS — S90822A Blister (nonthermal), left foot, initial encounter: Secondary | ICD-10-CM | POA: Diagnosis not present

## 2022-08-05 MED ORDER — MUPIROCIN 2 % EX OINT: TOPICAL_OINTMENT | Freq: Two times a day (BID) | CUTANEOUS | 0 refills | Status: DC

## 2022-08-05 NOTE — Progress Notes (Signed)
Assessment & Plan:  1. Chronic venous insufficiency Education provided on chronic venous insufficiency.  Discussed the importance of elevating the legs at or above the level of the heart.  Encouraged patient to keep compression hose on her legs during the day.  We applied an ACE wrap today.  Torsemide increased from once to twice daily for the next 3 to 4 days.  2. Blister of left foot, initial encounter Keep clean and dry.  Apply Bactroban ointment twice daily and cover with dry dressing.  Advised to let us know if she develops fever, chills, or erythema, warmth, and or drainage around the ruptured blister. - mupirocin ointment (BACTROBAN) 2 %; Apply topically 2 (two) times daily. Ruptured blister on the bottom of the left foot.  Dispense: 30 g; Refill: 0  3. Skin maceration Encouraged to dry thoroughly between the toes.    Follow up plan: Return if symptoms worsen or fail to improve.  Deliah Boston, MSN, APRN, FNP-C  Subjective:  HPI: Cheryl Delgado is a 77 y.o. female presenting on 08/05/2022 for Edema  Patient is here with concern of swelling and pain in her left leg and foot that started last week.  She normally wears compression hose but has been unable to get any on the left leg for the last few days.  She does take torsemide 20 mg daily.  If she is sitting in her recliner she does elevate her feet.  She reports a blister to the bottom of her left foot.  Denies fever and chills.  She is slightly more short of breath than usual.    ROS: Negative unless specifically indicated above in HPI.   Relevant past medical history reviewed and updated as indicated.   Allergies and medications reviewed and updated.   Current Outpatient Medications:    acetaminophen (TYLENOL) 500 MG tablet, Take 1,000 mg by mouth every 6 (six) hours as needed for moderate pain., Disp: , Rfl:    empagliflozin (JARDIANCE) 25 MG TABS tablet, TAKE 1 TABLET(25 MG) BY MOUTH DAILY, Disp: 90 tablet, Rfl: 3    glimepiride (AMARYL) 4 MG tablet, TAKE 1 TABLET(4 MG) BY MOUTH DAILY WITH BREAKFAST, Disp: 90 tablet, Rfl: 1   losartan (COZAAR) 50 MG tablet, TAKE 1 TABLET(50 MG) BY MOUTH DAILY, Disp: 90 tablet, Rfl: 1   metFORMIN (GLUCOPHAGE) 1000 MG tablet, TAKE 1 TABLET(1000 MG) BY MOUTH TWICE DAILY WITH A MEAL, Disp: 180 tablet, Rfl: 0   pioglitazone (ACTOS) 45 MG tablet, TAKE 1 TABLET(45 MG) BY MOUTH DAILY, Disp: 90 tablet, Rfl: 3   rosuvastatin (CRESTOR) 10 MG tablet, TAKE 1 TABLET(10 MG) BY MOUTH DAILY, Disp: 90 tablet, Rfl: 3   Semaglutide (RYBELSUS) 7 MG TABS, Take 7 mg by mouth daily., Disp: 30 tablet, Rfl: 0   torsemide (DEMADEX) 20 MG tablet, Take 1 tablet (20 mg total) by mouth daily. Annual appt due in August must see provider for future refills, Disp: 90 tablet, Rfl: 0   sennosides-docusate sodium (SENOKOT-S) 8.6-50 MG tablet, Take 1 tablet by mouth 2 (two) times daily as needed for constipation. (Patient not taking: Reported on 08/05/2022), Disp: 60 tablet, Rfl: 0   triamcinolone cream (KENALOG) 0.1 %, Apply 1 Application topically 2 (two) times daily. (Patient not taking: Reported on 08/05/2022), Disp: 100 g, Rfl: 0  No Known Allergies  Objective:   BP (!) 108/58   Pulse 62   Temp 98.3 F (36.8 C)   Resp 20   Ht  (1.575 m)  Wt 233 lb (105.7 kg)   BMI 42.62 kg/m    Physical Exam Vitals reviewed.  Constitutional:      General: She is not in acute distress.    Appearance: Normal appearance. She is morbidly obese. She is not ill-appearing, toxic-appearing or diaphoretic.  HENT:     Head: Normocephalic and atraumatic.  Eyes:     General: No scleral icterus.       Right eye: No discharge.        Left eye: No discharge.     Conjunctiva/sclera: Conjunctivae normal.  Cardiovascular:     Rate and Rhythm: Normal rate and regular rhythm.     Heart sounds: Normal heart sounds. No murmur heard.    No friction rub. No gallop.  Pulmonary:     Effort: Pulmonary effort is normal. No  respiratory distress.     Breath sounds: Normal breath sounds. No stridor. No wheezing, rhonchi or rales.  Musculoskeletal:        General: Normal range of motion.     Cervical back: Normal range of motion.     Right lower leg: 2+ Edema present.     Left lower leg: Tenderness present. Edema (skin very tight with very mild erythema. No warmth or drainage.) present.     Left foot: Swelling present.  Skin:    General: Skin is warm and dry.     Capillary Refill: Capillary refill takes less than 2 seconds.     Comments: Ruptured blister on the bottom of the left foot without drainage, erythema, or warmth. Maceration between toes of left foot.  Neurological:     General: No focal deficit present.     Mental Status: She is alert and oriented to person, place, and time. Mental status is at baseline.  Psychiatric:        Mood and Affect: Mood normal.        Behavior: Behavior normal.        Thought Content: Thought content normal.        Judgment: Judgment normal.

## 2022-08-05 NOTE — Patient Instructions (Addendum)
Increase Torsemide to twice daily x3-4 days.  Let us know if you develop fever, chills, or skin breakdown with erythema, warmth, and/or drainage.  Elevate your legs at/above the level of the heart.  Keep compression on your legs during the day. We applied an ACE-wrap today. Resume compression hose when you are able.

## 2022-08-14 ENCOUNTER — Telehealth: Payer: Self-pay | Admitting: Internal Medicine

## 2022-08-14 NOTE — Telephone Encounter (Signed)
Called patient to schedule Medicare Annual Wellness Visit (AWV). Left message for patient to call back and schedule Medicare Annual Wellness Visit (AWV).  Last date of AWV: 12/07/2021  Please schedule an appointment at any time with [facility].  If any questions, please contact me at [phone].  Thank you ,  Randon Goldsmith Care Guide [facility] AWV TEAM Direct Dial: [phone]

## 2022-09-03 ENCOUNTER — Encounter: Payer: Self-pay | Admitting: Internal Medicine

## 2022-09-03 ENCOUNTER — Ambulatory Visit (INDEPENDENT_AMBULATORY_CARE_PROVIDER_SITE_OTHER): Payer: Medicare HMO | Admitting: Internal Medicine

## 2022-09-03 VITALS — BP 138/100 | HR 70 | Temp 98.0°F | Ht 62.0 in | Wt 229.0 lb

## 2022-09-03 DIAGNOSIS — I10 Essential (primary) hypertension: Secondary | ICD-10-CM | POA: Diagnosis not present

## 2022-09-03 DIAGNOSIS — E118 Type 2 diabetes mellitus with unspecified complications: Secondary | ICD-10-CM | POA: Diagnosis not present

## 2022-09-03 DIAGNOSIS — Z7984 Long term (current) use of oral hypoglycemic drugs: Secondary | ICD-10-CM | POA: Diagnosis not present

## 2022-09-03 DIAGNOSIS — M5441 Lumbago with sciatica, right side: Secondary | ICD-10-CM

## 2022-09-03 LAB — POCT GLYCOSYLATED HEMOGLOBIN (HGB A1C): HbA1c POC (<> result, manual entry): 7.4 % (ref 4.0–5.6)

## 2022-09-03 MED ORDER — PREDNISONE 20 MG PO TABS: 40.0000 mg | ORAL_TABLET | Freq: Every day | ORAL | 0 refills | Status: DC

## 2022-09-03 MED ORDER — CYCLOBENZAPRINE HCL 5 MG PO TABS: 5.0000 mg | ORAL_TABLET | Freq: Two times a day (BID) | ORAL | 0 refills | Status: DC | PRN

## 2022-09-03 NOTE — Assessment & Plan Note (Signed)
Checking HgA1c today due to needing follow up. This is 7.4 which is controlled and at goal. Continue actos 45 mg daily and amaryl 4 mg daily and jardiance 25 mg daily and rybelsus 7 mg daily and metformin 1000 mg BID. On ARB and statin.

## 2022-09-03 NOTE — Assessment & Plan Note (Signed)
Suspect due to leg positioning at night time putting strain on her low back. Rx prednisone 5 day course and flexeril 5 mg BID prn. X-ray 11/2021 reviewed with her and no indication for repeat today unless she fails conservative treatment.

## 2022-09-03 NOTE — Assessment & Plan Note (Signed)
BP is mildly elevated today likely due to pain and has not taken medication yet before visit. She will continue losartan 50 mg daily and torsemide 20 mg daily and if persistently elevated at follow up in 3 months we will adjust regimen. No headaches or chest pains.

## 2022-09-03 NOTE — Patient Instructions (Signed)
We will send in prednisone to take 2 pills daily for 5 days to help the back pain.  If this is not helping let us know and we can check another x-ray.  We have also sent in flexeril to use for pain up to twice a day. This is a muscle relaxer and may help.

## 2022-09-03 NOTE — Progress Notes (Signed)
   Subjective:   Patient ID: Cheryl Delgado, female    DOB: 1945-10-01, 77 y.o.   MRN: [id number]  HPI The patient is a 77 YO female coming in for low back pain down right leg. No new numbness or weakness. Started with propping legs differently at night time.   Review of Systems  Constitutional:  Positive for activity change. Negative for appetite change, chills, fatigue, fever and unexpected weight change.  Respiratory: Negative.    Cardiovascular: Negative.   Gastrointestinal: Negative.   Musculoskeletal:  Positive for arthralgias, back pain and myalgias. Negative for gait problem and joint swelling.  Skin: Negative.   Neurological: Negative.     Objective:  Physical Exam Constitutional:      Appearance: Normal appearance.  HENT:     Head: Normocephalic.  Cardiovascular:     Rate and Rhythm: Normal rate and regular rhythm.  Pulmonary:     Effort: Pulmonary effort is normal.  Musculoskeletal:        General: Tenderness present. Normal range of motion.  Skin:    General: Skin is warm and dry.  Neurological:     General: No focal deficit present.     Mental Status: She is alert and oriented to person, place, and time.     Comments: Slow to stand and slow gait but steady     Vitals:   09/03/22 0922 09/03/22 0926  BP: (!) 138/100 (!) 138/100  Pulse: 70   Temp: 98 F (36.7 C)   TempSrc: Oral   SpO2: 95%   Weight: 229 lb (103.9 kg)   Height: 5\' 2"  (1.575 m)     Assessment & Plan:

## 2022-09-16 NOTE — Progress Notes (Unsigned)
Subjective:   Cheryl Delgado is a 77 y.o. female who presents for Medicare Annual (Subsequent) preventive examination.  Review of Systems    ***       Objective:    There were no vitals filed for this visit. There is no height or weight on file to calculate BMI.     03/02/2021    3:46 PM  Advanced Directives  Does Patient Have a Medical Advance Directive? No  Would patient like information on creating a medical advance directive? No - Patient declined    Current Medications (verified) Outpatient Encounter Medications as of 09/17/2022  Medication Sig   acetaminophen (TYLENOL) 500 MG tablet Take 1,000 mg by mouth every 6 (six) hours as needed for moderate pain.   cyclobenzaprine (FLEXERIL) 5 MG tablet Take 1 tablet (5 mg total) by mouth 2 (two) times daily as needed for muscle spasms.   empagliflozin (JARDIANCE) 25 MG TABS tablet TAKE 1 TABLET(25 MG) BY MOUTH DAILY   glimepiride (AMARYL) 4 MG tablet TAKE 1 TABLET(4 MG) BY MOUTH DAILY WITH BREAKFAST   losartan (COZAAR) 50 MG tablet TAKE 1 TABLET(50 MG) BY MOUTH DAILY   metFORMIN (GLUCOPHAGE) 1000 MG tablet TAKE 1 TABLET(1000 MG) BY MOUTH TWICE DAILY WITH A MEAL   mupirocin ointment (BACTROBAN) 2 % Apply topically 2 (two) times daily. Ruptured blister on the bottom of the left foot.   pioglitazone (ACTOS) 45 MG tablet TAKE 1 TABLET(45 MG) BY MOUTH DAILY   predniSONE (DELTASONE) 20 MG tablet Take 2 tablets (40 mg total) by mouth daily with breakfast.   rosuvastatin (CRESTOR) 10 MG tablet TAKE 1 TABLET(10 MG) BY MOUTH DAILY   Semaglutide (RYBELSUS) 7 MG TABS Take 7 mg by mouth daily.   sennosides-docusate sodium (SENOKOT-S) 8.6-50 MG tablet Take 1 tablet by mouth 2 (two) times daily as needed for constipation.   torsemide (DEMADEX) 20 MG tablet Take 1 tablet (20 mg total) by mouth daily. Annual appt due in August must see provider for future refills   triamcinolone cream (KENALOG) 0.1 % Apply 1 Application topically 2 (two) times  daily.   No facility-administered encounter medications on file as of 09/17/2022.    Allergies (verified) Patient has no known allergies.   History: Past Medical History:  Diagnosis Date   Chest pain, unspecified    Edema    peripheral   Hypertension    Type II or unspecified type diabetes mellitus without mention of complication, not stated as uncontrolled    Vertigo    Past Surgical History:  Procedure Laterality Date   COLONOSCOPY  [id number]   dialtion and curettage     TOTAL ABDOMINAL HYSTERECTOMY W/ BILATERAL SALPINGOOPHORECTOMY     due to fibroid tumors and uterine bleeding   Family History  Problem Relation Age of Onset   Hyperlipidemia Mother    Diabetes Mother    Heart disease Mother    Breast cancer Neg Hx    Colon cancer Neg Hx    Stroke Neg Hx    Amblyopia Neg Hx    Blindness Neg Hx    Cataracts Neg Hx    Glaucoma Neg Hx    Macular degeneration Neg Hx    Retinal detachment Neg Hx    Strabismus Neg Hx    Retinitis pigmentosa Neg Hx    Social History   Socioeconomic History   Marital status: Married    Spouse name: Not on file   Number of children: 1   Years of education:  15   Highest education level: Not on file  Occupational History   Occupation: retired    Comment: Science writer  Tobacco Use   Smoking status: Every Day    Packs/day: 0.50    Years: 30.00    Additional pack years: 0.00    Total pack years: 15.00    Types: Cigarettes   Smokeless tobacco: Never   Tobacco comments:    pt states she has smoked for a long time  Vaping Use   Vaping Use: Never used  Substance and Sexual Activity   Alcohol use: No   Drug use: No   Sexual activity: Yes    Partners: Male  Other Topics Concern   Not on file  Social History Narrative   HSG, 3 years college. Married 1971. 1 son - '78.  Lives with husband.Retired: Science writer - since 2006. 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.org   Social Determinants  of Corporate investment banker Strain: Not on file  Food Insecurity: Not on file  Transportation Needs: Not on file  Physical Activity: Not on file  Stress: Not on file  Social Connections: Not on file    Tobacco Counseling Ready to quit: Not Answered Counseling given: Not Answered Tobacco comments: pt states she has smoked for a long time   Clinical Intake:                 Diabetic?Yes   Nutrition Risk Assessment:  Has the patient had any N/V/D within the last 2 months?  {YES/NO:21197} Does the patient have any non-healing wounds?  {YES/NO:21197} Has the patient had any unintentional weight loss or weight gain?  {YES/NO:21197}  Diabetes:  Is the patient diabetic?  {YES/NO:21197} If diabetic, was a CBG obtained today?  {YES/NO:21197} Did the patient bring in their glucometer from home?  {YES/NO:21197} How often do you monitor your CBG's? ***.   Financial Strains and Diabetes Management:  Are you having any financial strains with the device, your supplies or your medication? {YES/NO:21197}.  Does the patient want to be seen by Chronic Care Management for management of their diabetes?  {YES/NO:21197} Would the patient like to be referred to a Nutritionist or for Diabetic Management?  {YES/NO:21197}  Diabetic Exams:  {Diabetic Eye Exam:2101801} Diabetic Foot Exam: Completed 12/07/21          Activities of Daily Living     No data to display          Patient Care Team: Myrlene Broker, MD as PCP - General (Internal Medicine) Melvenia Needles, MD (Ophthalmology)  Indicate any recent Medical Services you may have received from other than [facility] providers in the past year (date may be approximate).     Assessment:   This is a routine wellness examination for [place].  Hearing/Vision screen No results found.  Dietary issues and exercise activities discussed:     Goals Addressed   None    Depression Screen    09/03/2022    9:27 AM  08/05/2022    1:23 PM 02/12/2022   11:51 AM 12/07/2021    2:06 PM 12/01/2020    8:39 AM 03/14/2020    1:52 PM 09/30/2019   10:02 AM  PHQ 2/9 Scores  PHQ - 2 Score 0 0 0 0 0 0 0  PHQ- 9 Score 1          Fall Risk    09/03/2022    9:27 AM 08/05/2022    1:23 PM  02/15/2022    2:25 PM 02/12/2022   11:51 AM 12/07/2021    2:05 PM  Fall Risk   Falls in the past year? 0 0 0 1 1  Number falls in past yr: 0 0 0 0 1  Injury with Fall? 0 0 0 0 0  Risk for fall due to :  Impaired balance/gait No Fall Risks History of fall(s) History of fall(s)  Follow up Falls evaluation completed Falls evaluation completed Falls evaluation completed Falls evaluation completed Falls evaluation completed    FALL RISK PREVENTION PERTAINING TO THE HOME:  Any stairs in or around the home? {YES/NO:21197} If so, are there any without handrails? {YES/NO:21197} Home free of loose throw rugs in walkways, pet beds, electrical cords, etc? {YES/NO:21197} Adequate lighting in your home to reduce risk of falls? {YES/NO:21197}  ASSISTIVE DEVICES UTILIZED TO PREVENT FALLS:  Life alert? {YES/NO:21197} Use of a cane, walker or w/c? {YES/NO:21197} Grab bars in the bathroom? {YES/NO:21197} Shower chair or bench in shower? {YES/NO:21197} Elevated toilet seat or a handicapped toilet? {YES/NO:21197}  TIMED UP AND GO:  Was the test performed? No . Telephonic visit   Cognitive Function:         Immunizations Immunization History  Administered Date(s) Administered   Fluad Quad(high Dose 65+) 03/14/2020, 02/12/2022   Influenza, High Dose Seasonal PF 01/29/2016, 03/13/2017, 03/03/2018, 03/13/2019   Influenza,inj,Quad PF,6+ Mos 01/06/2014   Influenza-Unspecified 03/15/2017, 01/28/2019   PFIZER Comirnaty(Gray Top)Covid-19 Tri-Sucrose Vaccine 11/10/2020   PFIZER(Purple Top)SARS-COV-2 Vaccination 05/04/2019, 05/25/2019   Pneumococcal Conjugate-13 01/28/2014   Pneumococcal Polysaccharide-23 10/16/2010, 06/26/2018   Td  03/16/2008    {TDAP status:2101805}  Pneumococcal vaccine status: Up to date  Covid-19 vaccine status: Information provided on how to obtain vaccines.   Qualifies for Shingles Vaccine? Yes   Zostavax completed No   Shingrix Completed?: No.    Education has been provided regarding the importance of this vaccine. Patient has been advised to call insurance company to determine out of pocket expense if they have not yet received this vaccine. Advised may also receive vaccine at local pharmacy or Health Dept. Verbalized acceptance and understanding.  Screening Tests Health Maintenance  Topic Date Due   Zoster Vaccines- Shingrix (1 of 2) Never done   DTaP/Tdap/Td (2 - Tdap) 03/16/2018   COVID-19 Vaccine (4 - 2023-24 season) 09/19/2022 (Originally 12/14/2021)   OPHTHALMOLOGY EXAM  12/09/2022 (Originally 08/28/2018)   INFLUENZA VACCINE  11/14/2022   Diabetic kidney evaluation - eGFR measurement  12/08/2022   Diabetic kidney evaluation - Urine ACR  12/08/2022   FOOT EXAM  12/08/2022   Medicare Annual Wellness (AWV)  12/08/2022   HEMOGLOBIN A1C  03/06/2023   Pneumonia Vaccine 10+ Years old  Completed   DEXA SCAN  Completed   Hepatitis C Screening  Completed   HPV VACCINES  Aged Out   Fecal DNA (Cologuard)  Discontinued    Health Maintenance  Health Maintenance Due  Topic Date Due   Zoster Vaccines- Shingrix (1 of 2) Never done   DTaP/Tdap/Td (2 - Tdap) 03/16/2018    Colorectal cancer screening: No longer required.   Mammogram status: No longer required due to age and preference.  Bone Density status: Completed 07/15/19. Results reflect: Bone density results: NORMAL. Repeat every 5 years.  Lung Cancer Screening: (Low Dose CT Chest recommended if Age 96-80 years, 30 pack-year currently smoking OR have quit w/in 15years.) does not qualify.   Lung Cancer Screening Referral: n/a  Additional Screening:  Hepatitis C Screening: does qualify;  Completed 10/27/15  Vision Screening:  Recommended annual ophthalmology exams for early detection of glaucoma and other disorders of the eye. Is the patient up to date with their annual eye exam?  {YES/NO:21197} Who is the provider or what is the name of the office in which the patient attends annual eye exams? *** If pt is not established with a provider, would they like to be referred to a provider to establish care? {YES/NO:21197}.   Dental Screening: Recommended annual dental exams for proper oral hygiene  Community Resource Referral / Chronic Care Management: CRR required this visit?  {YES/NO:21197}  CCM required this visit?  {YES/NO:21197}     Plan:     I have personally reviewed and noted the following in the patient's chart:   Medical and social history Use of alcohol, tobacco or illicit drugs  Current medications and supplements including opioid prescriptions. {Opioid Prescriptions:[phone]} Functional ability and status Nutritional status Physical activity Advanced directives List of other physicians Hospitalizations, surgeries, and ER visits in previous 12 months Vitals Screenings to include cognitive, depression, and falls Referrals and appointments  In addition, I have reviewed and discussed with patient certain preventive protocols, quality metrics, and best practice recommendations. A written personalized care plan for preventive services as well as general preventive health recommendations were provided to patient.     Durwin Nora, [place]   10/21/2954   Due to this being a virtual visit, the after visit summary with patients personalized plan was offered to patient via mail or my-chart. ***Patient declined at this time./ Patient would like to access on my-chart/ per request, patient was mailed a copy of AVS./ Patient preferred to pick up at office at next visit  Nurse Notes: ***

## 2022-09-16 NOTE — Patient Instructions (Incomplete)
Cheryl Delgado , Thank you for taking time to come for your Medicare Wellness Visit. I appreciate your ongoing commitment to your health goals. Please review the following plan we discussed and let me know if I can assist you in the future.   These are the goals we discussed:  Goals   None     This is a list of the screening recommended for you and due dates:  Health Maintenance  Topic Date Due   Zoster (Shingles) Vaccine (1 of 2) Never done   DTaP/Tdap/Td vaccine (2 - Tdap) 03/16/2018   COVID-19 Vaccine (4 - 2023-24 season) 09/19/2022*   Eye exam for diabetics  12/09/2022*   Flu Shot  11/14/2022   Yearly kidney function blood test for diabetes  12/08/2022   Yearly kidney health urinalysis for diabetes  12/08/2022   Complete foot exam   12/08/2022   Medicare Annual Wellness Visit  12/08/2022   Hemoglobin A1C  03/06/2023   Pneumonia Vaccine  Completed   DEXA scan (bone density measurement)  Completed   Hepatitis C Screening  Completed   HPV Vaccine  Aged Out   Cologuard (Stool DNA test)  Discontinued  *Topic was postponed. The date shown is not the original due date.    Advanced directives: Information on Advanced Care Planning can be found at [facility] of [facility] Advance Health Care Directives Advance Health Care Directives ([email or web address]  Please bring a copy of your health care power of attorney and living will to the office to be added to your chart at your convenience.   Conditions/risks identified: Aim for 30 minutes of exercise or brisk walking, 6-8 glasses of water, and 5 servings of fruits and vegetables each day.   Next appointment: Follow up in one year for your annual wellness visit    Preventive Care 65 Years and Older, Female Preventive care refers to lifestyle choices and visits with your health care provider that can promote health and wellness. What does preventive care include? A yearly physical exam. This is also called an annual well check. Dental exams  once or twice a year. Routine eye exams. Ask your health care provider how often you should have your eyes checked. Personal lifestyle choices, including: Daily care of your teeth and gums. Regular physical activity. Eating a healthy diet. Avoiding tobacco and drug use. Limiting alcohol use. Practicing safe sex. Taking low-dose aspirin every day. Taking vitamin and mineral supplements as recommended by your health care provider. What happens during an annual well check? The services and screenings done by your health care provider during your annual well check will depend on your age, overall health, lifestyle risk factors, and family history of disease. Counseling  Your health care provider may ask you questions about your: Alcohol use. Tobacco use. Drug use. Emotional well-being. Home and relationship well-being. Sexual activity. Eating habits. History of falls. Memory and ability to understand (cognition). Work and work Astronomer. Reproductive health. Screening  You may have the following tests or measurements: Height, weight, and BMI. Blood pressure. Lipid and cholesterol levels. These may be checked every 5 years, or more frequently if you are over 1 years old. Skin check. Lung cancer screening. You may have this screening every year starting at age 14 if you have a 30-pack-year history of smoking and currently smoke or have quit within the past 15 years. Fecal occult blood test (FOBT) of the stool. You may have this test every year starting at age 74. Flexible sigmoidoscopy or colonoscopy.  You may have a sigmoidoscopy every 5 years or a colonoscopy every 10 years starting at age 71. Hepatitis C blood test. Hepatitis B blood test. Sexually transmitted disease (STD) testing. Diabetes screening. This is done by checking your blood sugar (glucose) after you have not eaten for a while (fasting). You may have this done every 1-3 years. Bone density scan. This is done to  screen for osteoporosis. You may have this done starting at age 13. Mammogram. This may be done every 1-2 years. Talk to your health care provider about how often you should have regular mammograms. Talk with your health care provider about your test results, treatment options, and if necessary, the need for more tests. Vaccines  Your health care provider may recommend certain vaccines, such as: Influenza vaccine. This is recommended every year. Tetanus, diphtheria, and acellular pertussis (Tdap, Td) vaccine. You may need a Td booster every 10 years. Zoster vaccine. You may need this after age 56. Pneumococcal 13-valent conjugate (PCV13) vaccine. One dose is recommended after age 9. Pneumococcal polysaccharide (PPSV23) vaccine. One dose is recommended after age 86. Talk to your health care provider about which screenings and vaccines you need and how often you need them. This information is not intended to replace advice given to you by your health care provider. Make sure you discuss any questions you have with your health care provider. Document Released: 04/28/2015 Document Revised: 12/20/2015 Document Reviewed: 01/31/2015 Elsevier Interactive Patient Education  2017 [employer].  Fall Prevention in the Home Falls can cause injuries. They can happen to people of all ages. There are many things you can do to make your home safe and to help prevent falls. What can I do on the outside of my home? Regularly fix the edges of walkways and driveways and fix any cracks. Remove anything that might make you trip as you walk through a door, such as a raised step or threshold. Trim any bushes or trees on the path to your home. Use bright outdoor lighting. Clear any walking paths of anything that might make someone trip, such as rocks or tools. Regularly check to see if handrails are loose or broken. Make sure that both sides of any steps have handrails. Any raised decks and porches should have  guardrails on the edges. Have any leaves, snow, or ice cleared regularly. Use sand or salt on walking paths during winter. Clean up any spills in your garage right away. This includes oil or grease spills. What can I do in the bathroom? Use night lights. Install grab bars by the toilet and in the tub and shower. Do not use towel bars as grab bars. Use non-skid mats or decals in the tub or shower. If you need to sit down in the shower, use a plastic, non-slip stool. Keep the floor dry. Clean up any water that spills on the floor as soon as it happens. Remove soap buildup in the tub or shower regularly. Attach bath mats securely with double-sided non-slip rug tape. Do not have throw rugs and other things on the floor that can make you trip. What can I do in the bedroom? Use night lights. Make sure that you have a light by your bed that is easy to reach. Do not use any sheets or blankets that are too big for your bed. They should not hang down onto the floor. Have a firm chair that has side arms. You can use this for support while you get dressed. Do not have  throw rugs and other things on the floor that can make you trip. What can I do in the kitchen? Clean up any spills right away. Avoid walking on wet floors. Keep items that you use a lot in easy-to-reach places. If you need to reach something above you, use a strong step stool that has a grab bar. Keep electrical cords out of the way. Do not use floor polish or wax that makes floors slippery. If you must use wax, use non-skid floor wax. Do not have throw rugs and other things on the floor that can make you trip. What can I do with my stairs? Do not leave any items on the stairs. Make sure that there are handrails on both sides of the stairs and use them. Fix handrails that are broken or loose. Make sure that handrails are as long as the stairways. Check any carpeting to make sure that it is firmly attached to the stairs. Fix any carpet  that is loose or worn. Avoid having throw rugs at the top or bottom of the stairs. If you do have throw rugs, attach them to the floor with carpet tape. Make sure that you have a light switch at the top of the stairs and the bottom of the stairs. If you do not have them, ask someone to add them for you. What else can I do to help prevent falls? Wear shoes that: Do not have high heels. Have rubber bottoms. Are comfortable and fit you well. Are closed at the toe. Do not wear sandals. If you use a stepladder: Make sure that it is fully opened. Do not climb a closed stepladder. Make sure that both sides of the stepladder are locked into place. Ask someone to hold it for you, if possible. Clearly mark and make sure that you can see: Any grab bars or handrails. First and last steps. Where the edge of each step is. Use tools that help you move around (mobility aids) if they are needed. These include: Canes. Walkers. Scooters. Crutches. Turn on the lights when you go into a dark area. Replace any light bulbs as soon as they burn out. Set up your furniture so you have a clear path. Avoid moving your furniture around. If any of your floors are uneven, fix them. If there are any pets around you, be aware of where they are. Review your medicines with your doctor. Some medicines can make you feel dizzy. This can increase your chance of falling. Ask your doctor what other things that you can do to help prevent falls. This information is not intended to replace advice given to you by your health care provider. Make sure you discuss any questions you have with your health care provider. Document Released: 01/26/2009 Document Revised: 09/07/2015 Document Reviewed: 05/06/2014 Elsevier Interactive Patient Education  2017 [employer].

## 2022-09-17 ENCOUNTER — Ambulatory Visit (INDEPENDENT_AMBULATORY_CARE_PROVIDER_SITE_OTHER): Payer: Medicare HMO

## 2022-09-17 VITALS — Ht 62.0 in | Wt 229.0 lb

## 2022-09-17 DIAGNOSIS — Z1231 Encounter for screening mammogram for malignant neoplasm of breast: Secondary | ICD-10-CM | POA: Diagnosis not present

## 2022-09-17 DIAGNOSIS — Z0000 Encounter for general adult medical examination without abnormal findings: Secondary | ICD-10-CM

## 2022-09-21 ENCOUNTER — Other Ambulatory Visit: Payer: Self-pay | Admitting: Internal Medicine

## 2022-10-22 ENCOUNTER — Other Ambulatory Visit: Payer: Self-pay | Admitting: Internal Medicine

## 2022-10-25 ENCOUNTER — Telehealth: Payer: Self-pay | Admitting: Internal Medicine

## 2022-10-25 MED ORDER — GLIMEPIRIDE 4 MG PO TABS: 4.0000 mg | ORAL_TABLET | Freq: Every day | ORAL | 0 refills | Status: DC

## 2022-10-25 MED ORDER — LOSARTAN POTASSIUM 50 MG PO TABS: 50.0000 mg | ORAL_TABLET | Freq: Every day | ORAL | 0 refills | Status: DC

## 2022-10-25 NOTE — Telephone Encounter (Signed)
Sent refills to [facility]...Raechel Chute

## 2022-10-25 NOTE — Telephone Encounter (Signed)
Prescription Request  10/25/2022  LOV: 09/03/2022  What is the name of the medication or equipment? Losartan and glimpiride  Have you contacted your pharmacy to request a refill? Yes   Which pharmacy would you like this sent to?  [id number] - [place], [place] - [address] AT [facility] OF [address] [place] [address] Phone: [phone] Fax: [phone]    Patient notified that their request is being sent to the clinical staff for review and that they should receive a response within 2 business days.   Please advise at Mobile [phone] (mobile)

## 2022-11-04 ENCOUNTER — Encounter: Payer: Self-pay | Admitting: *Deleted

## 2022-11-04 ENCOUNTER — Telehealth: Payer: Self-pay | Admitting: *Deleted

## 2022-11-04 NOTE — Telephone Encounter (Signed)
I attempted to contact patient by telephone but was unsuccessful. According to the patient's chart they are due for Annual Exam after 8/25 with [facility]. I have left a HIPAA compliant message advising the patient to contact [facility] at [phone]. I will continue to follow up with the patient to make sure this appointment is scheduled.

## 2022-11-07 NOTE — Telephone Encounter (Signed)
    I attempted to contact patient by telephone but was unsuccessful. According to the patient's chart they are due for Annual Exam after 8/25 with [facility]. I have left a HIPAA compliant message advising the patient to contact [facility] at [phone]. I will continue to follow up with the patient to make sure this appointment is scheduled.

## 2022-12-09 ENCOUNTER — Other Ambulatory Visit: Payer: Self-pay | Admitting: Internal Medicine

## 2023-01-13 ENCOUNTER — Ambulatory Visit (INDEPENDENT_AMBULATORY_CARE_PROVIDER_SITE_OTHER): Payer: Medicare HMO

## 2023-01-13 DIAGNOSIS — Z23 Encounter for immunization: Secondary | ICD-10-CM | POA: Diagnosis not present

## 2023-01-13 NOTE — Progress Notes (Signed)
After obtaining consent, and per orders of Dr. Okey Dupre, injection of HDF given by Ferdie Ping. Patient instructed to report any adverse reaction to me immediately.

## 2023-01-23 ENCOUNTER — Other Ambulatory Visit: Payer: Self-pay | Admitting: Internal Medicine

## 2023-02-25 ENCOUNTER — Other Ambulatory Visit: Payer: Self-pay | Admitting: Internal Medicine

## 2023-03-11 ENCOUNTER — Telehealth: Payer: Self-pay | Admitting: Internal Medicine

## 2023-03-11 NOTE — Telephone Encounter (Signed)
Prescription Request  03/11/2023  LOV: 09/03/2022  What is the name of the medication or equipment? Actos - generic  Have you contacted your pharmacy to request a refill? Yes   Which pharmacy would you like this sent to?  [id number] - [place], [place] - [address] AT [facility] OF [address] [place] [address] Phone: [phone] Fax: [phone]    Patient notified that their request is being sent to the clinical staff for review and that they should receive a response within 2 business days.   Please advise at Mobile [phone] (mobile)

## 2023-03-12 ENCOUNTER — Other Ambulatory Visit: Payer: Self-pay

## 2023-03-12 MED ORDER — PIOGLITAZONE HCL 45 MG PO TABS: 45.0000 mg | ORAL_TABLET | Freq: Every day | ORAL | 1 refills | Status: DC

## 2023-03-31 ENCOUNTER — Other Ambulatory Visit: Payer: Self-pay | Admitting: Internal Medicine

## 2023-04-03 ENCOUNTER — Other Ambulatory Visit: Payer: Self-pay | Admitting: Internal Medicine

## 2023-04-03 NOTE — Telephone Encounter (Signed)
Copied from CRM [id number]. Topic: Clinical - Medication Refill >> Apr 03, 2023 10:48 AM Sonny Dandy B wrote: Most Recent Primary Care Visit:  Provider: Cathleen Fears, GRACE P  [facility]  Visit Type: NURSE VISIT  Date: 01/13/2023  Medication: refill empagliflozin (JARDIANCE) 25 MG TABS tablet. Requesting a 90 day supply 2 nd call   Has the patient contacted their pharmacy? Yes (Agent: If no, request that the patient contact the pharmacy for the refill. If patient does not wish to contact the pharmacy document the reason why and proceed with request.) (Agent: If yes, when and what did the pharmacy advise?)  Is this the correct pharmacy for this prescription? Yes If no, delete pharmacy and type the correct one.  This is the patient's preferred pharmacy:  [facility] #[id number] - [place], [place] - [address] AT [facility] OF [address] [place] [address] Phone: [phone] Fax: [phone]   Has the prescription been filled recently? No  Is the patient out of the medication? Yes  Has the patient been seen for an appointment in the last year OR does the patient have an upcoming appointment? Yes  Can we respond through MyChart? No  Agent: Please be advised that Rx refills may take up to 3 business days. We ask that you follow-up with your pharmacy.

## 2023-04-03 NOTE — Telephone Encounter (Signed)
Due for follow up only 30 day okay make visit please

## 2023-04-07 ENCOUNTER — Telehealth: Payer: Self-pay

## 2023-04-07 NOTE — Telephone Encounter (Signed)
Copied from CRM [id number]. Topic: Clinical - Medication Question >> Apr 07, 2023 11:44 AM Isabell A wrote:  Reason for CRM: Patient was advised she needs to make an appointment before getting refills for empagliflozin (JARDIANCE) 25 MG TABS tablet. Patient scheduled for 04/22/23, requesting her refills to be sent prior to her appointment.

## 2023-04-07 NOTE — Telephone Encounter (Signed)
Copied from CRM [id number]. Topic: Clinical - Prescription Issue >> Apr 07, 2023 11:42 AM Isabell A wrote: Most Recent Primary Care Visit:  Provider: Cathleen Fears, GRACE P  Department: [facility]  Visit Type: NURSE VISIT  Date: 01/13/2023  Medication: metFORMIN (GLUCOPHAGE) 1000 MG tablet   Has the patient contacted their pharmacy? No (Agent: If no, request that the patient contact the pharmacy for the refill. If patient does not wish to contact the pharmacy document the reason why and proceed with request.) (Agent: If yes, when and what did the pharmacy advise?)  Is this the correct pharmacy for this prescription? Yes If no, delete pharmacy and type the correct one.  This is the patient's preferred pharmacy:  [facility] #[id number] - [place], [place] - [address] AT [facility] OF [address] [place] [address] Phone: [phone] Fax: [phone]   Has the prescription been filled recently? Yes  Is the patient out of the medication? No  Has the patient been seen for an appointment in the last year OR does the patient have an upcoming appointment? Yes  Can we respond through MyChart? Yes  Agent: Please be advised that Rx refills may take up to 3 business days. We ask that you follow-up with your pharmacy.

## 2023-04-13 ENCOUNTER — Other Ambulatory Visit: Payer: Self-pay | Admitting: Internal Medicine

## 2023-04-22 ENCOUNTER — Ambulatory Visit (INDEPENDENT_AMBULATORY_CARE_PROVIDER_SITE_OTHER): Payer: Medicare HMO | Admitting: Internal Medicine

## 2023-04-22 ENCOUNTER — Encounter: Payer: Self-pay | Admitting: Internal Medicine

## 2023-04-22 VITALS — BP 136/80 | HR 82 | Temp 97.7°F | Ht 62.0 in | Wt 233.0 lb

## 2023-04-22 DIAGNOSIS — E1169 Type 2 diabetes mellitus with other specified complication: Secondary | ICD-10-CM

## 2023-04-22 DIAGNOSIS — E118 Type 2 diabetes mellitus with unspecified complications: Secondary | ICD-10-CM

## 2023-04-22 DIAGNOSIS — Z0001 Encounter for general adult medical examination with abnormal findings: Secondary | ICD-10-CM | POA: Diagnosis not present

## 2023-04-22 DIAGNOSIS — Z7984 Long term (current) use of oral hypoglycemic drugs: Secondary | ICD-10-CM

## 2023-04-22 DIAGNOSIS — E785 Hyperlipidemia, unspecified: Secondary | ICD-10-CM | POA: Diagnosis not present

## 2023-04-22 DIAGNOSIS — Z6841 Body Mass Index (BMI) 40.0 and over, adult: Secondary | ICD-10-CM

## 2023-04-22 DIAGNOSIS — I872 Venous insufficiency (chronic) (peripheral): Secondary | ICD-10-CM

## 2023-04-22 DIAGNOSIS — S90822A Blister (nonthermal), left foot, initial encounter: Secondary | ICD-10-CM

## 2023-04-22 LAB — CBC
HCT: 33.7 % — ABNORMAL LOW (ref 36.0–46.0)
Hemoglobin: 10.8 g/dL — ABNORMAL LOW (ref 12.0–15.0)
MCHC: 32.1 g/dL (ref 30.0–36.0)
MCV: 79.6 fL (ref 78.0–100.0)
Platelets: 164 10*3/uL (ref 150.0–400.0)
RBC: 4.23 Mil/uL (ref 3.87–5.11)
RDW: 18.9 % — ABNORMAL HIGH (ref 11.5–15.5)
WBC: 5.7 10*3/uL (ref 4.0–10.5)

## 2023-04-22 LAB — LIPID PANEL
Cholesterol: 163 mg/dL (ref 0–200)
HDL: 82.2 mg/dL (ref >39.00)
LDL Cholesterol: 71 mg/dL (ref 0–99)
NonHDL: 81.26
Total CHOL/HDL Ratio: 2
Triglycerides: 53 mg/dL (ref 0.0–149.0)
VLDL: 10.6 mg/dL (ref 0.0–40.0)

## 2023-04-22 LAB — COMPREHENSIVE METABOLIC PANEL
ALT: 8 U/L (ref 0–35)
AST: 17 U/L (ref 0–37)
Albumin: 4.1 g/dL (ref 3.5–5.2)
Alkaline Phosphatase: 66 U/L (ref 39–117)
BUN: 24 mg/dL — ABNORMAL HIGH (ref 6–23)
CO2: 32 meq/L (ref 19–32)
Calcium: 9.6 mg/dL (ref 8.4–10.5)
Chloride: 104 meq/L (ref 96–112)
Creatinine, Ser: 0.77 mg/dL (ref 0.40–1.20)
GFR: 74.76 mL/min (ref >60.00)
Glucose, Bld: 103 mg/dL — ABNORMAL HIGH (ref 70–99)
Potassium: 4 meq/L (ref 3.5–5.1)
Sodium: 143 meq/L (ref 135–145)
Total Bilirubin: 0.4 mg/dL (ref 0.2–1.2)
Total Protein: 7.2 g/dL (ref 6.0–8.3)

## 2023-04-22 LAB — HEMOGLOBIN A1C: Hgb A1c MFr Bld: 7.9 % — ABNORMAL HIGH (ref 4.6–6.5)

## 2023-04-22 MED ORDER — METFORMIN HCL 1000 MG PO TABS: 1000.0000 mg | ORAL_TABLET | Freq: Two times a day (BID) | ORAL | 3 refills | Status: AC

## 2023-04-22 MED ORDER — CYCLOBENZAPRINE HCL 5 MG PO TABS: 5.0000 mg | ORAL_TABLET | Freq: Two times a day (BID) | ORAL | 0 refills | Status: DC | PRN

## 2023-04-22 MED ORDER — ROSUVASTATIN CALCIUM 10 MG PO TABS: ORAL_TABLET | ORAL | 3 refills | Status: DC

## 2023-04-22 MED ORDER — MUPIROCIN 2 % EX OINT: TOPICAL_OINTMENT | Freq: Two times a day (BID) | CUTANEOUS | 0 refills | Status: DC

## 2023-04-22 MED ORDER — LOSARTAN POTASSIUM 50 MG PO TABS: 50.0000 mg | ORAL_TABLET | Freq: Every day | ORAL | 3 refills | Status: DC

## 2023-04-22 MED ORDER — TRIAMCINOLONE ACETONIDE 0.1 % EX CREA: 1.0000 | TOPICAL_CREAM | Freq: Two times a day (BID) | CUTANEOUS | 3 refills | Status: DC

## 2023-04-22 MED ORDER — GLIMEPIRIDE 4 MG PO TABS: 4.0000 mg | ORAL_TABLET | Freq: Every day | ORAL | 3 refills | Status: DC

## 2023-04-22 MED ORDER — TORSEMIDE 20 MG PO TABS: 20.0000 mg | ORAL_TABLET | Freq: Every day | ORAL | 3 refills | Status: DC

## 2023-04-22 MED ORDER — PIOGLITAZONE HCL 45 MG PO TABS: 45.0000 mg | ORAL_TABLET | Freq: Every day | ORAL | 3 refills | Status: DC

## 2023-04-22 NOTE — Patient Instructions (Signed)
 We will check the labs today.  Take 2 torsemide today and for the next 2-3 days and then go back to 1 pill daily.   We have sent in the ointment to use on the foot.

## 2023-04-22 NOTE — Progress Notes (Signed)
   Subjective:   Patient ID: Cheryl Delgado, female    DOB: 07-Jul-1945, 78 y.o.   MRN: [id number]  HPI The patient is here for physical.  PMH, [facility], social history reviewed and updated  Review of Systems  Constitutional: Negative.   HENT: Negative.    Eyes: Negative.   Respiratory:  Negative for cough, chest tightness and shortness of breath.   Cardiovascular:  Positive for leg swelling. Negative for chest pain and palpitations.  Gastrointestinal:  Negative for abdominal distention, abdominal pain, constipation, diarrhea, nausea and vomiting.  Musculoskeletal: Negative.   Skin: Negative.   Neurological: Negative.   Psychiatric/Behavioral: Negative.      Objective:  Physical Exam Constitutional:      Appearance: She is well-developed.  HENT:     Head: Normocephalic and atraumatic.  Cardiovascular:     Rate and Rhythm: Normal rate and regular rhythm.  Pulmonary:     Effort: Pulmonary effort is normal. No respiratory distress.     Breath sounds: Normal breath sounds. No wheezing or rales.  Abdominal:     General: Bowel sounds are normal. There is no distension.     Palpations: Abdomen is soft.     Tenderness: There is no abdominal tenderness. There is no rebound.  Musculoskeletal:     Cervical back: Normal range of motion.     Right lower leg: Edema present.     Left lower leg: Edema present.     Comments: Stable from prior swelling bilateral  Skin:    General: Skin is warm and dry.  Neurological:     Mental Status: She is alert and oriented to person, place, and time.     Coordination: Coordination abnormal.     Vitals:   04/22/23 1305  BP: 136/80  Pulse: 82  Temp: 97.7 F (36.5 C)  TempSrc: Oral  SpO2: 91%  Weight: 233 lb (105.7 kg)  Height: 5' 2 (1.575 m)    Assessment & Plan:

## 2023-04-23 LAB — MICROALBUMIN / CREATININE URINE RATIO
Creatinine,U: 44.4 mg/dL
Microalb Creat Ratio: 21.5 mg/g (ref 0.0–30.0)
Microalb, Ur: 9.5 mg/dL — ABNORMAL HIGH (ref 0.0–1.9)

## 2023-04-25 NOTE — Assessment & Plan Note (Signed)
 Flu shot up to date. Pneumonia complete. Shingrix due at pharmacy. Tetanus due at pharmacy. Colonoscopy aged out prior to recall. Mammogram aged out, pap smear aged out and dexa complete. Counseled about sun safety and mole surveillance. Counseled about the dangers of distracted driving. Given 10 year screening recommendations.

## 2023-04-25 NOTE — Assessment & Plan Note (Signed)
 BMI 42 and counseled about diet to help.

## 2023-04-25 NOTE — Assessment & Plan Note (Signed)
 Checking HgA1c, foot exam done checking lipid panel and CMP and microalbumin to creatinine ratio. Adjust as needed metformin 1000 mg BID and amaryl 4 mg daily and jardiance 25 mg daily and actos 45 mg daily. On statin and ARB.

## 2023-04-25 NOTE — Assessment & Plan Note (Signed)
 Slightly more than baseline. Asked her to double torsemide to 2 pills daily for 5 days then resume 20 mg daily.

## 2023-04-25 NOTE — Assessment & Plan Note (Signed)
Checking lipid panel and adjust as needed her crestor.  

## 2023-05-02 ENCOUNTER — Ambulatory Visit: Payer: Self-pay | Admitting: Internal Medicine

## 2023-05-02 NOTE — Telephone Encounter (Signed)
Copied from CRM [id number]. Topic: Clinical - Red Word Triage >> May 02, 2023  8:36 AM Lennart Pall wrote: Reason for CRM: Patients right leg is turning red, possible infection as it is warm to the touch. Bottom of foot hurts when she walks.   Chief Complaint:  Patient states her  leg is turning red, possible infection as it is warm to the touch. Bottom of foot hurts when she walks from the blisters. Symptoms: redness started this morning  and it is warm to touch. Denies red streak. Redness is coming  up to her right under her knee Frequency: Day 1 Pertinent Negatives: Patient denies pain. Disposition: [] ED /[] Urgent Care (no appt availability in office) / [] Appointment(In office/virtual)/ []  [facility]/ [] Home Care/ [] Refused Recommended Disposition /[] [facility]/ []  Follow-up with PCP Additional Notes:  Scheduled. Patient is requesting antibiotic to be called in  to prevent infection.  Also Scheduled an appointment for the office. Patient does have mupirocin that she will use until appointment.  Chief Complaint: Reason for Disposition  Looks like a boil, infected sore, deep ulcer or other infected rash (spreading redness, pus)  Answer Assessment - Initial Assessment Questions 1. ONSET: "When did the swelling start?" (e.g., minutes, hours, days)      Swelling  all the time, however the Left leg has the redness started this morning  and it is warm to touch. Denies red streak. Redness is coming  up to her right under her knee 1/4  2. LOCATION: "What part of the leg is swollen?"  "Are both legs swollen or just one leg?"     Both legs are swollen 3. SEVERITY: "How bad is the swelling?" (e.g., localized; mild, moderate, severe)   -    - MODERATE edema: Swelling of lower leg to knee, pitting edema > 1/4 inch (6 mm) deep, rest and elevation only partially reduce swelling.  4. REDNESS: "Does the swelling look red or infected?"       Redness and warm and redness is increasing up  ward the left leg 7. CAUSE: "What do you think is causing the leg swelling?"      Circulation and Blisters on the bottom on her feet- skin on top of foot is dark and scaly so top of foot on left leg is dark and scaly    Reason for Disposition  Looks like a boil, infected sore, deep ulcer or other infected rash (spreading redness, pus)  Answer Assessment - Initial Assessment Questions 1. ONSET: "When did the swelling start?" (e.g., minutes, hours, days)      Swelling  all the time, however the Left leg has the redness started this morning  and it is warm to touch. Denies red streak. Redness is coming  up to her right under her knee 1/4  2. LOCATION: "What part of the leg is swollen?"  "Are both legs swollen or just one leg?"     Both legs are swollen 3. SEVERITY: "How bad is the swelling?" (e.g., localized; mild, moderate, severe)   -    - MODERATE edema: Swelling of lower leg to knee, pitting edema > 1/4 inch (6 mm) deep, rest and elevation only partially reduce swelling.  4. REDNESS: "Does the swelling look red or infected?"       Redness and warm and redness is increasing up ward the left leg  5. PAIN: "Is the swelling painful to touch?" If Yes, ask: "How painful is it?"   (Scale 1-10; mild,  moderate or severe)      Denies  any pain. 6. FEVER: "Do you have a fever?" If Yes, ask: "What is it, how was it measured, and when did it start?"      Denies. 7. CAUSE: "What do you think is causing the leg swelling?"      Circulation and Blisters on the bottom on her feet- skin on top of foot is dark and scaly so top of foot on left leg is dark and scaly  8. MEDICAL HISTORY: "Do you have a history of blood clots (e.g., DVT), cancer, heart failure, kidney disease, or liver failure?"     Denies  9. RECURRENT SYMPTOM: "Have you had leg swelling before?" If Yes, ask: "When was the last time?" "What happened that time?"      Swollen all the time . Last times for redness a year  ago. 10. OTHER  SYMPTOMS: "Do you have any other symptoms?" (e.g., chest pain, difficulty breathing)       Denies 11. PREGNANCY: "Is there any chance you are pregnant?" "When was your last menstrual period?"       N/A  Protocols used: Leg Swelling and Edema-A-AH

## 2023-05-02 NOTE — Telephone Encounter (Signed)
Called and let Pt know

## 2023-05-02 NOTE — Telephone Encounter (Signed)
Very sorry - needs UC please

## 2023-05-05 ENCOUNTER — Encounter: Payer: Self-pay | Admitting: Internal Medicine

## 2023-05-05 ENCOUNTER — Ambulatory Visit (INDEPENDENT_AMBULATORY_CARE_PROVIDER_SITE_OTHER): Payer: Medicare HMO | Admitting: Internal Medicine

## 2023-05-05 VITALS — BP 124/80 | HR 75 | Temp 97.5°F | Ht 62.0 in | Wt 232.0 lb

## 2023-05-05 DIAGNOSIS — Z7984 Long term (current) use of oral hypoglycemic drugs: Secondary | ICD-10-CM

## 2023-05-05 DIAGNOSIS — L03116 Cellulitis of left lower limb: Secondary | ICD-10-CM | POA: Diagnosis not present

## 2023-05-05 DIAGNOSIS — E118 Type 2 diabetes mellitus with unspecified complications: Secondary | ICD-10-CM

## 2023-05-05 DIAGNOSIS — I10 Essential (primary) hypertension: Secondary | ICD-10-CM

## 2023-05-05 DIAGNOSIS — M79672 Pain in left foot: Secondary | ICD-10-CM | POA: Insufficient documentation

## 2023-05-05 MED ORDER — CEPHALEXIN 500 MG PO CAPS: 500.0000 mg | ORAL_CAPSULE | Freq: Three times a day (TID) | ORAL | 0 refills | Status: DC

## 2023-05-05 NOTE — Patient Instructions (Signed)
Please take all new medication as prescribed - the antibiotic  Please continue all other medications as before, and refills have been done if requested.  Please have the pharmacy call with any other refills you may need.  Please keep your appointments with your specialists as you may have planned  You will be contacted regarding the referral for: Podiatry (foot doctor)

## 2023-05-05 NOTE — Assessment & Plan Note (Signed)
Also for podiatry referral given blistering and hx of DM, likely would benefit from DM shoes

## 2023-05-05 NOTE — Assessment & Plan Note (Signed)
BP Readings from Last 3 Encounters:  05/05/23 124/80  04/22/23 136/80  09/03/22 (!) 138/100   Stable, pt to continue medical treatment losartan 50 mg qd

## 2023-05-05 NOTE — Assessment & Plan Note (Signed)
Lab Results  Component Value Date   HGBA1C 7.9 (H) 04/22/2023   Mild uncontrolled, pt to continue current medical treatment jardiance 25, amaryl 4 every day, metformin 100 bid, actos 45 mg as declines other change

## 2023-05-05 NOTE — Progress Notes (Signed)
Chief Complaint: follow up left leg redness swelling       HPI:  Cheryl Delgado is a 78 y.o. female here with c/o 2 days onset mild but painful and acute on chronic swelling to left mid plantar foot after rupture blister lesion now with redness and tender extending to medial left foot, ankle and anterior mid leg to mid pretibial.  No other ulcer or drainage.  Pt denies chest pain, increased sob or doe, wheezing, orthopnea, PND,  palpitations, dizziness or syncope.   Pt denies polydipsia, polyuria, or new focal neuro s/s.         Wt Readings from Last 3 Encounters:  05/05/23 232 lb (105.2 kg)  04/22/23 233 lb (105.7 kg)  09/17/22 229 lb (103.9 kg)   BP Readings from Last 3 Encounters:  05/05/23 124/80  04/22/23 136/80  09/03/22 (!) 138/100         Past Medical History:  Diagnosis Date   Chest pain, unspecified    Edema    peripheral   Hypertension    Type II or unspecified type diabetes mellitus without mention of complication, not stated as uncontrolled    Vertigo    Past Surgical History:  Procedure Laterality Date   COLONOSCOPY  [id number]   dialtion and curettage     TOTAL ABDOMINAL HYSTERECTOMY W/ BILATERAL SALPINGOOPHORECTOMY     due to fibroid tumors and uterine bleeding    reports that she has been smoking cigarettes. She has a 15 pack-year smoking history. She has never used smokeless tobacco. She reports that she does not drink alcohol and does not use drugs. family history includes Diabetes in her mother; Heart disease in her mother; Hyperlipidemia in her mother. No Known Allergies Current Outpatient Medications on File Prior to Visit  Medication Sig Dispense Refill   acetaminophen (TYLENOL) 500 MG tablet Take 1,000 mg by mouth every 6 (six) hours as needed for moderate pain.     cyclobenzaprine (FLEXERIL) 5 MG tablet Take 1 tablet (5 mg total) by mouth 2 (two) times daily as needed for muscle spasms. 30 tablet 0   empagliflozin (JARDIANCE) 25 MG TABS tablet TAKE  1 TABLET(25 MG) BY MOUTH DAILY 30 tablet 0   glimepiride (AMARYL) 4 MG tablet Take 1 tablet (4 mg total) by mouth daily with breakfast. 90 tablet 3   losartan (COZAAR) 50 MG tablet Take 1 tablet (50 mg total) by mouth daily. 90 tablet 3   metFORMIN (GLUCOPHAGE) 1000 MG tablet Take 1 tablet (1,000 mg total) by mouth 2 (two) times daily with a meal. 180 tablet 3   mupirocin ointment (BACTROBAN) 2 % Apply topically 2 (two) times daily. Top of left foot 30 g 0   pioglitazone (ACTOS) 45 MG tablet Take 1 tablet (45 mg total) by mouth daily. 90 tablet 3   rosuvastatin (CRESTOR) 10 MG tablet TAKE 1 TABLET(10 MG) BY MOUTH DAILY 90 tablet 3   sennosides-docusate sodium (SENOKOT-S) 8.6-50 MG tablet Take 1 tablet by mouth 2 (two) times daily as needed for constipation. 60 tablet 0   torsemide (DEMADEX) 20 MG tablet Take 1 tablet (20 mg total) by mouth daily. 90 tablet 3   triamcinolone cream (KENALOG) 0.1 % Apply 1 Application topically 2 (two) times daily. 100 g 3   No current facility-administered medications on file prior to visit.        ROS:  All others reviewed and negative.  Objective  PE:  BP 124/80 (BP Location: Left Arm, Patient Position: Sitting, Cuff Size: Normal)   Pulse 75   Temp (!) 97.5 F (36.4 C) (Oral)   Ht 5\' 2"  (1.575 m)   Wt 232 lb (105.2 kg)   SpO2 98%   BMI 42.43 kg/m                 Constitutional: Pt appears in NAD               HENT: Head: NCAT.                Right Ear: External ear normal.                 Left Ear: External ear normal.                Eyes: . Pupils are equal, round, and reactive to light. Conjunctivae and EOM are normal               Nose: without d/c or deformity               Neck: Neck supple. Gross normal ROM               Cardiovascular: Normal rate and regular rhythm.                 Pulmonary/Chest: Effort normal and breath sounds without rales or wheezing.                Abd:  Soft, NT, ND, + BS, no organomegaly                Neurological: Pt is alert. At baseline orientation, motor grossly intact               Skin: Skin is warm. No rashes, no other new lesions, LE edema - chronic 1+ bilateral now mild worsening distal LLE swelling redness tender from mid plantar foot, ankle to mid pretibial                Psychiatric: Pt behavior is normal without agitation   Micro: none  Cardiac tracings I have personally interpreted today:  none  Pertinent Radiological findings (summarize): none   Lab Results  Component Value Date   WBC 5.7 04/22/2023   HGB 10.8 (L) 04/22/2023   HCT 33.7 (L) 04/22/2023   PLT 164.0 04/22/2023   GLUCOSE 103 (H) 04/22/2023   CHOL 163 04/22/2023   TRIG 53.0 04/22/2023   HDL 82.20 04/22/2023   LDLCALC 71 04/22/2023   ALT 8 04/22/2023   AST 17 04/22/2023   NA 143 04/22/2023   K 4.0 04/22/2023   CL 104 04/22/2023   CREATININE 0.77 04/22/2023   BUN 24 (H) 04/22/2023   CO2 32 04/22/2023   TSH 4.14 01/26/2014   HGBA1C 7.9 (H) 04/22/2023   MICROALBUR 9.5 (H) 04/22/2023   Assessment/Plan:  Cheryl Delgado is a 78 y.o. Black or African American [2] female with  has a past medical history of Chest pain, unspecified, Edema, Hypertension, Type II or unspecified type diabetes mellitus without mention of complication, not stated as uncontrolled, and Vertigo.  Left leg cellulitis Mild to mod, for antibx course cephalexin 500 tid,  to f/u any worsening symptoms or concerns  Left foot pain Also for podiatry referral given blistering and hx of DM, likely would benefit from DM shoes  Diabetes mellitus type 2 with complications [facility]) Lab Results  Component Value Date   HGBA1C  7.9 (H) 04/22/2023   Mild uncontrolled, pt to continue current medical treatment jardiance 25, amaryl 4 every day, metformin 100 bid, actos 45 mg as declines other change   Essential hypertension BP Readings from Last 3 Encounters:  05/05/23 124/80  04/22/23 136/80  09/03/22 (!) 138/100   Stable, pt to continue  medical treatment losartan 50 mg qd  Followup: Return if symptoms worsen or fail to improve.  Cheryl Barre, MD 05/05/2023 8:31 PM [facility]

## 2023-05-05 NOTE — Assessment & Plan Note (Signed)
Mild to mod, for antibx course cephalexin 500 tid,  to f/u any worsening symptoms or concerns

## 2023-05-19 ENCOUNTER — Ambulatory Visit (INDEPENDENT_AMBULATORY_CARE_PROVIDER_SITE_OTHER): Payer: Medicare HMO | Admitting: Podiatry

## 2023-05-19 ENCOUNTER — Encounter: Payer: Self-pay | Admitting: Podiatry

## 2023-05-19 VITALS — Ht 62.0 in | Wt 232.0 lb

## 2023-05-19 DIAGNOSIS — E118 Type 2 diabetes mellitus with unspecified complications: Secondary | ICD-10-CM

## 2023-05-19 DIAGNOSIS — B353 Tinea pedis: Secondary | ICD-10-CM

## 2023-05-19 DIAGNOSIS — B351 Tinea unguium: Secondary | ICD-10-CM

## 2023-05-19 DIAGNOSIS — M79675 Pain in left toe(s): Secondary | ICD-10-CM

## 2023-05-19 DIAGNOSIS — M79674 Pain in right toe(s): Secondary | ICD-10-CM

## 2023-05-19 MED ORDER — KETOCONAZOLE 2 % EX CREA: 1.0000 | TOPICAL_CREAM | Freq: Every day | CUTANEOUS | 2 refills | Status: DC

## 2023-05-19 NOTE — Progress Notes (Signed)
  Subjective:  Patient ID: Cheryl Delgado, female    DOB: 1945-07-16,   MRN: [id number]  Chief Complaint  Patient presents with   [facility]    78 y.o. female presents for concern of thickened elongated and painful nails that are difficult to trim. Requesting to have them trimmed today. Relates burning and tingling in their feet. Patient is diabetic and last A1c was  Lab Results  Component Value Date   HGBA1C 7.9 (H) 04/22/2023   .   Also has concern for rash on the bottom of her foot that has been a chornic problem for her. She has been on triamcinalone from PCP that has helped the top of the foot but the bottom is still acting up and itchy.,  PCP:  Myrlene Broker, MD    . Denies any other pedal complaints. Denies n/v/f/c.   Past Medical History:  Diagnosis Date   Chest pain, unspecified    Edema    peripheral   Hypertension    Type II or unspecified type diabetes mellitus without mention of complication, not stated as uncontrolled    Vertigo     Objective:  Physical Exam: Vascular: DP/PT pulses 2/4 bilateral. CFT <3 seconds. Absent hair growth on digits. Edema noted to bilateral lower extremities. Xerosis noted bilaterally.  Skin. No lacerations or abrasions bilateral feet. Nails 1-5 bilateral  are thickened discolored and elongated with subungual debris. Scaling and erythema noted to platnar left midfoot with hyperkeratosis as well.  Musculoskeletal: MMT 5/5 bilateral lower extremities in DF, PF, Inversion and Eversion. Deceased ROM in DF of ankle joint.  Neurological: Sensation intact to light touch. Protective sensation diminished bilateral.    Assessment:   1. Tinea pedis of left foot   2. Diabetes mellitus type 2 with complications (HCC)   3. Pain due to onychomycosis of toenails of both feet      Plan:  Patient was evaluated and treated and all questions answered. -Discussed and educated patient on diabetic foot care, especially with  regards to the vascular,  neurological and musculoskeletal systems.  -Stressed the importance of good glycemic control and the detriment of not  controlling glucose levels in relation to the foot. -Discussed supportive shoes at all times and checking feet regularly.  -Mechanically debrided all nails 1-5 bilateral using sterile nail nipper and filed with dremel without incident  -Answered all patient questions -Continue triamcinalone in morning and will add on ketoconazole at night and prescription provided.  -Patient to return  in 3 weeks for recheck.   Louann Sjogren, DPM

## 2023-06-09 ENCOUNTER — Ambulatory Visit (INDEPENDENT_AMBULATORY_CARE_PROVIDER_SITE_OTHER): Payer: Medicare HMO | Admitting: Podiatry

## 2023-06-09 ENCOUNTER — Encounter: Payer: Self-pay | Admitting: Podiatry

## 2023-06-09 DIAGNOSIS — E118 Type 2 diabetes mellitus with unspecified complications: Secondary | ICD-10-CM

## 2023-06-09 DIAGNOSIS — B353 Tinea pedis: Secondary | ICD-10-CM | POA: Diagnosis not present

## 2023-06-09 MED ORDER — KETOCONAZOLE 2 % EX CREA: 1.0000 | TOPICAL_CREAM | Freq: Every day | CUTANEOUS | 2 refills | Status: DC

## 2023-06-09 NOTE — Progress Notes (Signed)
  Subjective:  Patient ID: Cheryl Delgado, female    DOB: [birth year],   MRN: [id number]  No chief complaint on file.   78 y.o. female presents for follow-up of tinea pedis. Relates it is doing better. Has been using triamcinaolone and ketoconazole.    PCP:  Myrlene Broker, MD    . Denies any other pedal complaints. Denies n/v/f/c.   Past Medical History:  Diagnosis Date   Chest pain, unspecified    Edema    peripheral   Hypertension    Type II or unspecified type diabetes mellitus without mention of complication, not stated as uncontrolled    Vertigo     Objective:  Physical Exam: Vascular: DP/PT pulses 2/4 bilateral. CFT <3 seconds. Absent hair growth on digits. Edema noted to bilateral lower extremities. Xerosis noted bilaterally.  Skin. No lacerations or abrasions bilateral feet. Nails 1-5 bilateral  are thickened discolored and elongated with subungual debris. Scaling and erythema noted to platnar left midfoot with hyperkeratosis as well.  Musculoskeletal: MMT 5/5 bilateral lower extremities in DF, PF, Inversion and Eversion. Deceased ROM in DF of ankle joint.  Neurological: Sensation intact to light touch. Protective sensation diminished bilateral.    Assessment:   1. Tinea pedis of left foot   2. Diabetes mellitus type 2 with complications (HCC)       Plan:  Patient was evaluated and treated and all questions answered. -Discussed and educated patient on diabetic foot care, especially with  regards to the vascular, neurological and musculoskeletal systems.  -Stressed the importance of good glycemic control and the detriment of not  controlling glucose levels in relation to the foot. -Discussed supportive shoes at all times and checking feet regularly.  -Mechanically debrided all nails 1-5 bilateral using sterile nail nipper and filed with dremel without incident  -Answered all patient questions -Continue triamcinalone in morning and ketoconazole at night.  Refill provided.  -Patient to return  in 3 months for rfc.   Louann Sjogren, DPM

## 2023-06-12 ENCOUNTER — Other Ambulatory Visit: Payer: Self-pay

## 2023-06-12 ENCOUNTER — Emergency Department (HOSPITAL_BASED_OUTPATIENT_CLINIC_OR_DEPARTMENT_OTHER): Payer: Medicare HMO | Admitting: Radiology

## 2023-06-12 ENCOUNTER — Observation Stay (HOSPITAL_BASED_OUTPATIENT_CLINIC_OR_DEPARTMENT_OTHER)
Admission: EM | Admit: 2023-06-12 | Discharge: 2023-06-15 | Disposition: A | Discharge status: Home / Self Care | Payer: Medicare HMO | Attending: General Surgery | Admitting: General Surgery

## 2023-06-12 ENCOUNTER — Emergency Department (HOSPITAL_BASED_OUTPATIENT_CLINIC_OR_DEPARTMENT_OTHER): Payer: Medicare HMO

## 2023-06-12 ENCOUNTER — Encounter (HOSPITAL_BASED_OUTPATIENT_CLINIC_OR_DEPARTMENT_OTHER): Payer: Self-pay | Admitting: Radiology

## 2023-06-12 DIAGNOSIS — S299XXA Unspecified injury of thorax, initial encounter: Secondary | ICD-10-CM

## 2023-06-12 DIAGNOSIS — M5441 Lumbago with sciatica, right side: Secondary | ICD-10-CM | POA: Diagnosis not present

## 2023-06-12 DIAGNOSIS — L03116 Cellulitis of left lower limb: Secondary | ICD-10-CM | POA: Insufficient documentation

## 2023-06-12 DIAGNOSIS — W010XXA Fall on same level from slipping, tripping and stumbling without subsequent striking against object, initial encounter: Secondary | ICD-10-CM | POA: Diagnosis not present

## 2023-06-12 DIAGNOSIS — I872 Venous insufficiency (chronic) (peripheral): Secondary | ICD-10-CM | POA: Diagnosis not present

## 2023-06-12 DIAGNOSIS — E785 Hyperlipidemia, unspecified: Secondary | ICD-10-CM | POA: Diagnosis not present

## 2023-06-12 DIAGNOSIS — R42 Dizziness and giddiness: Secondary | ICD-10-CM | POA: Insufficient documentation

## 2023-06-12 DIAGNOSIS — I10 Essential (primary) hypertension: Secondary | ICD-10-CM | POA: Insufficient documentation

## 2023-06-12 DIAGNOSIS — Z0001 Encounter for general adult medical examination with abnormal findings: Secondary | ICD-10-CM | POA: Diagnosis not present

## 2023-06-12 DIAGNOSIS — M79662 Pain in left lower leg: Secondary | ICD-10-CM | POA: Diagnosis not present

## 2023-06-12 DIAGNOSIS — M25561 Pain in right knee: Secondary | ICD-10-CM | POA: Insufficient documentation

## 2023-06-12 DIAGNOSIS — M79661 Pain in right lower leg: Secondary | ICD-10-CM | POA: Insufficient documentation

## 2023-06-12 DIAGNOSIS — F17210 Nicotine dependence, cigarettes, uncomplicated: Secondary | ICD-10-CM | POA: Insufficient documentation

## 2023-06-12 DIAGNOSIS — Z79899 Other long term (current) drug therapy: Secondary | ICD-10-CM | POA: Insufficient documentation

## 2023-06-12 DIAGNOSIS — Z7984 Long term (current) use of oral hypoglycemic drugs: Secondary | ICD-10-CM | POA: Insufficient documentation

## 2023-06-12 DIAGNOSIS — S2249XA Multiple fractures of ribs, unspecified side, initial encounter for closed fracture: Principal | ICD-10-CM | POA: Diagnosis present

## 2023-06-12 DIAGNOSIS — E1169 Type 2 diabetes mellitus with other specified complication: Secondary | ICD-10-CM | POA: Diagnosis not present

## 2023-06-12 DIAGNOSIS — M79672 Pain in left foot: Secondary | ICD-10-CM | POA: Insufficient documentation

## 2023-06-12 DIAGNOSIS — W19XXXA Unspecified fall, initial encounter: Principal | ICD-10-CM

## 2023-06-12 LAB — CBC
HCT: 33.1 % — ABNORMAL LOW (ref 36.0–46.0)
Hemoglobin: 10.4 g/dL — ABNORMAL LOW (ref 12.0–15.0)
MCH: 24.6 pg — ABNORMAL LOW (ref 26.0–34.0)
MCHC: 31.4 g/dL (ref 30.0–36.0)
MCV: 78.4 fL — ABNORMAL LOW (ref 80.0–100.0)
Platelets: 164 10*3/uL (ref 150–400)
RBC: 4.22 MIL/uL (ref 3.87–5.11)
RDW: 18 % — ABNORMAL HIGH (ref 11.5–15.5)
WBC: 4.9 10*3/uL (ref 4.0–10.5)
nRBC: 0 % (ref 0.0–0.2)

## 2023-06-12 LAB — BASIC METABOLIC PANEL
Anion gap: 7 (ref 5–15)
BUN: 31 mg/dL — ABNORMAL HIGH (ref 8–23)
CO2: 30 mmol/L (ref 22–32)
Calcium: 9.7 mg/dL (ref 8.9–10.3)
Chloride: 102 mmol/L (ref 98–111)
Creatinine, Ser: 0.78 mg/dL (ref 0.44–1.00)
GFR, Estimated: >60 mL/min (ref >60)
Glucose, Bld: 102 mg/dL — ABNORMAL HIGH (ref 70–99)
Potassium: 4.7 mmol/L (ref 3.5–5.1)
Sodium: 139 mmol/L (ref 135–145)

## 2023-06-12 MED ORDER — ACETAMINOPHEN 325 MG PO TABS
650.0000 mg | ORAL_TABLET | Freq: Once | ORAL | Status: AC
  Administered 2023-06-12: 650 mg via ORAL
  Filled 2023-06-12: qty 2

## 2023-06-12 MED ORDER — MORPHINE SULFATE (PF) 4 MG/ML IV SOLN
4.0000 mg | Freq: Once | INTRAVENOUS | Status: AC
  Administered 2023-06-12: 4 mg via INTRAVENOUS
  Filled 2023-06-12: qty 1

## 2023-06-12 MED ORDER — IOHEXOL 300 MG/ML  SOLN
100.0000 mL | Freq: Once | INTRAMUSCULAR | Status: AC | PRN
  Administered 2023-06-12: 75 mL via INTRAVENOUS

## 2023-06-12 NOTE — ED Triage Notes (Signed)
 Fell yesterday. Tripped over feet. No thinners. No head neck injury. Left bicep pain and left rib cage pain. Ambulatory after- no hip, pelvis, leg pain.

## 2023-06-12 NOTE — ED Notes (Signed)
 RT educated pt on proper technique/ use of IS. Pt able to perform without difficulty. Pt goal set to 1250 mLs and able to achieve 900 mLs w/good effort.    06/12/23 2302  Incentive Spirometry Tx  Level of Service Assisted by RCP  Frequency q1hr W/A  Treatment Tolerance Tolerated well  IS Goal (mL) (RN or RT) 1250 mL  IS - Achieved (mL) (RN, NT, or RT) 900 mL

## 2023-06-12 NOTE — ED Notes (Signed)
 Pt ambulatory to restroom, stand-by assist. Pt placed back on monitor in room.

## 2023-06-12 NOTE — ED Provider Notes (Signed)
 [facility] EMERGENCY DEPARTMENT AT [facility] Provider Note   CSN: [id number] Arrival date & time: 06/12/23  1501     History  No chief complaint on file.   Cheryl Delgado is a 78 y.o. female who presents the emergency department after mechanical fall yesterday.  Patient states that she tripped over her feet, and struck a stool on the left side of her ribs.  Denies any head injury or loss of consciousness.  She is not on blood thinners.  She is mainly complaining of pain in her left arm and her left ribs.  Pain is worse with deep breathing.  HPI     Home Medications Prior to Admission medications   Medication Sig Start Date End Date Taking? Authorizing Provider  acetaminophen (TYLENOL) 500 MG tablet Take 1,000 mg by mouth every 6 (six) hours as needed for moderate pain.    [provider]  cyclobenzaprine (FLEXERIL) 5 MG tablet Take 1 tablet (5 mg total) by mouth 2 (two) times daily as needed for muscle spasms. 04/22/23   Myrlene Broker, MD  empagliflozin (JARDIANCE) 25 MG TABS tablet TAKE 1 TABLET(25 MG) BY MOUTH DAILY 04/07/23   Myrlene Broker, MD  glimepiride (AMARYL) 4 MG tablet Take 1 tablet (4 mg total) by mouth daily with breakfast. 04/22/23   Myrlene Broker, MD  ketoconazole (NIZORAL) 2 % cream Apply 1 Application topically daily. 06/09/23   Louann Sjogren, DPM  losartan (COZAAR) 50 MG tablet Take 1 tablet (50 mg total) by mouth daily. 04/22/23   Myrlene Broker, MD  metFORMIN (GLUCOPHAGE) 1000 MG tablet Take 1 tablet (1,000 mg total) by mouth 2 (two) times daily with a meal. 04/22/23   Myrlene Broker, MD  mupirocin ointment (BACTROBAN) 2 % Apply topically 2 (two) times daily. Top of left foot 04/22/23   Myrlene Broker, MD  pioglitazone (ACTOS) 45 MG tablet Take 1 tablet (45 mg total) by mouth daily. 04/22/23   Myrlene Broker, MD  rosuvastatin (CRESTOR) 10 MG tablet TAKE 1 TABLET(10 MG) BY MOUTH DAILY 04/22/23   Myrlene Broker, MD  sennosides-docusate sodium (SENOKOT-S) 8.6-50 MG tablet Take 1 tablet by mouth 2 (two) times daily as needed for constipation. 02/26/21   Myrlene Broker, MD  torsemide (DEMADEX) 20 MG tablet Take 1 tablet (20 mg total) by mouth daily. 04/22/23   Myrlene Broker, MD  triamcinolone cream (KENALOG) 0.1 % Apply 1 Application topically 2 (two) times daily. 04/22/23   Myrlene Broker, MD      Allergies    Patient has no known allergies.    Review of Systems   Review of Systems  Cardiovascular:  Positive for chest pain.  Musculoskeletal:  Positive for arthralgias.  All other systems reviewed and are negative.   Physical Exam Updated Vital Signs BP (!) 140/65   Pulse 78   Temp 98.7 F (37.1 C)   Resp 18   SpO2 98%  Physical Exam Vitals and nursing note reviewed.  Constitutional:      Appearance: Normal appearance.  HENT:     Head: Normocephalic and atraumatic.  Eyes:     Conjunctiva/sclera: Conjunctivae normal.  Neck:     Comments: No midline tenderness Cardiovascular:     Rate and Rhythm: Normal rate and regular rhythm.  Pulmonary:     Effort: Pulmonary effort is normal. No respiratory distress.     Breath sounds: Normal breath sounds.  Chest:     Comments:  Tenderness to palpation over the left lateral chest wall, around the breast line Abdominal:     General: There is no distension.     Palpations: Abdomen is soft.     Tenderness: There is no abdominal tenderness.  Musculoskeletal:     Comments: Pelvis stable  Skin:    General: Skin is warm and dry.  Neurological:     General: No focal deficit present.     Mental Status: She is alert.     ED Results / Procedures / Treatments   Labs (all labs ordered are listed, but only abnormal results are displayed) Labs Reviewed  CBC - Abnormal; Notable for the following components:      Result Value   Hemoglobin 10.4 (*)    HCT 33.1 (*)    MCV 78.4 (*)    MCH 24.6 (*)    RDW 18.0 (*)     All other components within normal limits  BASIC METABOLIC PANEL - Abnormal; Notable for the following components:   Glucose, Bld 102 (*)    BUN 31 (*)    All other components within normal limits    EKG EKG Interpretation Date/Time:  Thursday June 12 2023 15:38:08 EST Ventricular Rate:  69 PR Interval:  188 QRS Duration:  86 QT Interval:  394 QTC Calculation: 422 R Axis:   62  Text Interpretation: Normal sinus rhythm with sinus arrhythmia Cannot rule out Anterior infarct , age undetermined Abnormal ECG No previous ECGs available no prior ECG for comparison No STEMI Confirmed by Theda Belfast ([id number]) on 06/12/2023 4:33:34 PM  Radiology DG Humerus Left Result Date: 06/12/2023 CLINICAL DATA:  Pain after fall. EXAM: LEFT HUMERUS - 2+ VIEW COMPARISON:  Left humerus radiographs dated 03/16/2008. FINDINGS: There is no evidence of fracture or other focal bone lesions. Soft tissues are unremarkable. IMPRESSION: No acute osseous abnormality. Electronically Signed   By: Hart Robinsons M.D.   On: 06/12/2023 18:38   DG Ribs Unilateral W/Chest Left Result Date: 06/12/2023 CLINICAL DATA:  Pain after fall. EXAM: LEFT RIBS AND CHEST - 3+ VIEW COMPARISON:  Chest radiograph dated 01/29/2016. FINDINGS: Suspected contour irregularity along the left lateral eighth and ninth ribs could represent nondisplaced fractures. Increased density at the left lung base and costophrenic angle. No pneumothorax. Heart size is borderline enlarged, unchanged. Mediastinal contours are within normal limits. IMPRESSION: Suspected contour irregularity along the left lateral eighth and ninth ribs could represent nondisplaced fractures. Increased density at the left lung base and costophrenic angle may reflect atelectasis, small effusion, or contusion. No pneumothorax identified. Consider further evaluation with CT chest. Electronically Signed   By: Hart Robinsons M.D.   On: 06/12/2023 18:37    Procedures Procedures     Medications Ordered in ED Medications  acetaminophen (TYLENOL) tablet 650 mg (650 mg Oral Given 06/12/23 1534)  morphine (PF) 4 MG/ML injection 4 mg (4 mg Intravenous Given 06/12/23 1955)  iohexol (OMNIPAQUE) 300 MG/ML solution 100 mL (75 mLs Intravenous Contrast Given 06/12/23 2047)    ED Course/ Medical Decision Making/ A&P                                 Medical Decision Making Amount and/or Complexity of Data Reviewed Labs: ordered. Radiology: ordered.  Risk OTC drugs. Prescription drug management.   This patient is a 78 y.o. female  who presents to the ED for concern of mechanical fall with rib injury.  Differential diagnoses prior to evaluation: The emergent differential diagnosis includes, but is not limited to, fracture, dislocation, ligamentous injury, internal bleeding. This is not an exhaustive differential.   Past Medical History / Co-morbidities / Social History: Hypertension, type 2 diabetes  Physical Exam: Physical exam performed. The pertinent findings include: Hypertensive, otherwise normal vital signs, no acute distress.  Left lateral chest wall tenderness to palpation, pelvis stable, head atraumatic and no cervical midline tenderness  Lab Tests/Imaging studies: I personally interpreted labs/imaging and the pertinent results include: X-ray of the left humerus unremarkable, left rib x-ray shows irregularity along the left lateral eighth and ninth ribs, possibly nondisplaced fracture, recommended further evaluation with CT chest.. I agree with the radiologist interpretation.  CT chest pending at time of shift change.  CBC and BMP at baseline.  Cardiac monitoring: EKG obtained and interpreted by myself and attending physician which shows: Normal sinus rhythm   Medications: I ordered medication including Tylenol and morphine.  I have reviewed the patients home medicines and have made adjustments as needed.   Disposition: Patient discussed and care  transferred to [facility] at shift change. Please see his/her note for further details regarding further ED course and disposition. Plan at time of handoff is follow up on CT. Ordered incentive spirometer. Anticipate dc to home with treatment of rib fractures.   Final Clinical Impression(s) / ED Diagnoses Final diagnoses:  Fall, initial encounter  Rib injury    Rx / DC Orders ED Discharge Orders     None      Portions of this report may have been transcribed using voice recognition software. Every effort was made to ensure accuracy; however, inadvertent computerized transcription errors may be present.    Jeanella Flattery 06/12/23 2155    Tegeler, Canary Brim, MD 06/13/23 [phone]

## 2023-06-12 NOTE — ED Provider Notes (Signed)
  Accepted handoff at shift change from [facility]. Please see prior provider note for more detail.   Briefly: Patient is 78 y.o. "presents the emergency department after mechanical fall yesterday. Patient states that she tripped over her feet, and struck a stool on the left side of her ribs. Denies any head injury or loss of consciousness. She is not on blood thinners. She is mainly complaining of pain in her left arm and her left ribs. Pain is worse with deep breathing."  DDX: concern for , fracture, dislocation, ligamentous injury, internal bleeding. This is not an exhaustive differential.   Plan:  -dispo pending CT chest - CT chest showing multiple rib fractures. Patient is overall stable with appropriate oxygen saturations on RA. -Consulted with Dr. Andrey Campanile with trauma who is admitting patient for multiple rib fractures.  I appreciate his help.   .Critical Care  Performed by: Dorthy Cooler, PA-C Authorized by: Dorthy Cooler, PA-C   Critical care provider statement:    Critical care time (minutes):  30   Critical care was necessary to treat or prevent imminent or life-threatening deterioration of the following conditions: multiple rib fractures and pulmonary contusion.   Critical care was time spent personally by me on the following activities:  Development of treatment plan with patient or surrogate, discussions with consultants, evaluation of patient's response to treatment, examination of patient, ordering and review of laboratory studies, ordering and review of radiographic studies, ordering and performing treatments and interventions, pulse oximetry, re-evaluation of patient's condition and review of old charts   Care discussed with: admitting provider         Dorthy Cooler, PA-C 06/12/23 2345    Tegeler, Canary Brim, MD 06/13/23 [phone]

## 2023-06-13 ENCOUNTER — Encounter (HOSPITAL_BASED_OUTPATIENT_CLINIC_OR_DEPARTMENT_OTHER): Payer: Self-pay

## 2023-06-13 DIAGNOSIS — S299XXA Unspecified injury of thorax, initial encounter: Secondary | ICD-10-CM | POA: Diagnosis present

## 2023-06-13 DIAGNOSIS — M25561 Pain in right knee: Secondary | ICD-10-CM | POA: Diagnosis not present

## 2023-06-13 DIAGNOSIS — M79662 Pain in left lower leg: Secondary | ICD-10-CM | POA: Diagnosis not present

## 2023-06-13 DIAGNOSIS — F17210 Nicotine dependence, cigarettes, uncomplicated: Secondary | ICD-10-CM | POA: Diagnosis not present

## 2023-06-13 DIAGNOSIS — W010XXA Fall on same level from slipping, tripping and stumbling without subsequent striking against object, initial encounter: Secondary | ICD-10-CM | POA: Diagnosis not present

## 2023-06-13 DIAGNOSIS — Z79899 Other long term (current) drug therapy: Secondary | ICD-10-CM | POA: Diagnosis not present

## 2023-06-13 DIAGNOSIS — L03116 Cellulitis of left lower limb: Secondary | ICD-10-CM | POA: Diagnosis not present

## 2023-06-13 DIAGNOSIS — Z7984 Long term (current) use of oral hypoglycemic drugs: Secondary | ICD-10-CM | POA: Diagnosis not present

## 2023-06-13 DIAGNOSIS — I872 Venous insufficiency (chronic) (peripheral): Secondary | ICD-10-CM | POA: Diagnosis not present

## 2023-06-13 DIAGNOSIS — E1169 Type 2 diabetes mellitus with other specified complication: Secondary | ICD-10-CM | POA: Diagnosis not present

## 2023-06-13 DIAGNOSIS — M79672 Pain in left foot: Secondary | ICD-10-CM | POA: Diagnosis not present

## 2023-06-13 DIAGNOSIS — Z0001 Encounter for general adult medical examination with abnormal findings: Secondary | ICD-10-CM | POA: Diagnosis not present

## 2023-06-13 DIAGNOSIS — S2249XA Multiple fractures of ribs, unspecified side, initial encounter for closed fracture: Secondary | ICD-10-CM | POA: Diagnosis not present

## 2023-06-13 DIAGNOSIS — M79661 Pain in right lower leg: Secondary | ICD-10-CM | POA: Diagnosis not present

## 2023-06-13 DIAGNOSIS — M5441 Lumbago with sciatica, right side: Secondary | ICD-10-CM | POA: Diagnosis not present

## 2023-06-13 DIAGNOSIS — I10 Essential (primary) hypertension: Secondary | ICD-10-CM | POA: Diagnosis not present

## 2023-06-13 DIAGNOSIS — R42 Dizziness and giddiness: Secondary | ICD-10-CM | POA: Diagnosis not present

## 2023-06-13 DIAGNOSIS — E785 Hyperlipidemia, unspecified: Secondary | ICD-10-CM | POA: Diagnosis not present

## 2023-06-13 LAB — GLUCOSE, CAPILLARY
Glucose-Capillary: 122 mg/dL — ABNORMAL HIGH (ref 70–99)
Glucose-Capillary: 228 mg/dL — ABNORMAL HIGH (ref 70–99)
Glucose-Capillary: 76 mg/dL (ref 70–99)

## 2023-06-13 LAB — BASIC METABOLIC PANEL
Anion gap: 5 (ref 5–15)
BUN: 25 mg/dL — ABNORMAL HIGH (ref 8–23)
CO2: 29 mmol/L (ref 22–32)
Calcium: 9.3 mg/dL (ref 8.9–10.3)
Chloride: 104 mmol/L (ref 98–111)
Creatinine, Ser: 0.72 mg/dL (ref 0.44–1.00)
GFR, Estimated: >60 mL/min (ref >60)
Glucose, Bld: 176 mg/dL — ABNORMAL HIGH (ref 70–99)
Potassium: 4 mmol/L (ref 3.5–5.1)
Sodium: 138 mmol/L (ref 135–145)

## 2023-06-13 LAB — CBC
HCT: 31.8 % — ABNORMAL LOW (ref 36.0–46.0)
Hemoglobin: 10 g/dL — ABNORMAL LOW (ref 12.0–15.0)
MCH: 24.8 pg — ABNORMAL LOW (ref 26.0–34.0)
MCHC: 31.4 g/dL (ref 30.0–36.0)
MCV: 78.7 fL — ABNORMAL LOW (ref 80.0–100.0)
Platelets: 162 10*3/uL (ref 150–400)
RBC: 4.04 MIL/uL (ref 3.87–5.11)
RDW: 18 % — ABNORMAL HIGH (ref 11.5–15.5)
WBC: 5.2 10*3/uL (ref 4.0–10.5)
nRBC: 0 % (ref 0.0–0.2)

## 2023-06-13 LAB — CBG MONITORING, ED: Glucose-Capillary: 130 mg/dL — ABNORMAL HIGH (ref 70–99)

## 2023-06-13 MED ORDER — HYDRALAZINE HCL 20 MG/ML IJ SOLN: 10.0000 mg | INTRAMUSCULAR | Status: DC | PRN

## 2023-06-13 MED ORDER — METOPROLOL TARTRATE 5 MG/5ML IV SOLN: 5.0000 mg | Freq: Four times a day (QID) | INTRAVENOUS | Status: DC | PRN

## 2023-06-13 MED ORDER — MELATONIN 3 MG PO TABS: 3.0000 mg | ORAL_TABLET | Freq: Every evening | ORAL | Status: DC | PRN

## 2023-06-13 MED ORDER — LOSARTAN POTASSIUM 50 MG PO TABS
50.0000 mg | ORAL_TABLET | Freq: Every day | ORAL | Status: DC
Start: 2023-06-13 — End: 2023-06-15
  Administered 2023-06-13 – 2023-06-15 (×3): 50 mg via ORAL
  Filled 2023-06-13 (×3): qty 1

## 2023-06-13 MED ORDER — PHENOL 1.4 % MT LIQD
1.0000 | OROMUCOSAL | Status: DC | PRN
Start: 2023-06-13 — End: 2023-06-15
  Administered 2023-06-13: 1 via OROMUCOSAL
  Filled 2023-06-13: qty 177

## 2023-06-13 MED ORDER — SODIUM CHLORIDE 0.9 % IV SOLN: INTRAVENOUS | Status: AC

## 2023-06-13 MED ORDER — GLIMEPIRIDE 4 MG PO TABS
4.0000 mg | ORAL_TABLET | Freq: Every day | ORAL | Status: DC
  Administered 2023-06-14 – 2023-06-15 (×2): 4 mg via ORAL
  Filled 2023-06-13 (×2): qty 1

## 2023-06-13 MED ORDER — ACETAMINOPHEN 500 MG PO TABS
1000.0000 mg | ORAL_TABLET | Freq: Four times a day (QID) | ORAL | Status: DC
Start: 2023-06-13 — End: 2023-06-15
  Administered 2023-06-13 – 2023-06-15 (×9): 1000 mg via ORAL
  Filled 2023-06-13 (×11): qty 2

## 2023-06-13 MED ORDER — METFORMIN HCL 500 MG PO TABS
1000.0000 mg | ORAL_TABLET | Freq: Two times a day (BID) | ORAL | Status: DC
  Administered 2023-06-15: 1000 mg via ORAL
  Filled 2023-06-13: qty 2

## 2023-06-13 MED ORDER — PIOGLITAZONE HCL 30 MG PO TABS
45.0000 mg | ORAL_TABLET | Freq: Every day | ORAL | Status: DC
  Administered 2023-06-13 – 2023-06-15 (×3): 45 mg via ORAL
  Filled 2023-06-13 (×3): qty 1

## 2023-06-13 MED ORDER — METHOCARBAMOL 1000 MG/10ML IJ SOLN: 500.0000 mg | Freq: Three times a day (TID) | INTRAMUSCULAR | Status: DC

## 2023-06-13 MED ORDER — GUAIFENESIN ER 600 MG PO TB12: 600.0000 mg | ORAL_TABLET | Freq: Two times a day (BID) | ORAL | Status: DC | PRN

## 2023-06-13 MED ORDER — INSULIN ASPART 100 UNIT/ML IJ SOLN
0.0000 [IU] | Freq: Three times a day (TID) | INTRAMUSCULAR | Status: DC
  Administered 2023-06-13: 5 [IU] via SUBCUTANEOUS
  Administered 2023-06-14: 3 [IU] via SUBCUTANEOUS
  Administered 2023-06-14 (×2): 2 [IU] via SUBCUTANEOUS
  Administered 2023-06-15: 3 [IU] via SUBCUTANEOUS
  Administered 2023-06-15: 2 [IU] via SUBCUTANEOUS

## 2023-06-13 MED ORDER — POLYETHYLENE GLYCOL 3350 17 G PO PACK: 17.0000 g | PACK | Freq: Every day | ORAL | Status: DC | PRN

## 2023-06-13 MED ORDER — METFORMIN HCL 500 MG PO TABS: 1000.0000 mg | ORAL_TABLET | Freq: Two times a day (BID) | ORAL | Status: DC

## 2023-06-13 MED ORDER — LIDOCAINE 5 % EX PTCH
1.0000 | MEDICATED_PATCH | CUTANEOUS | Status: DC
  Administered 2023-06-13 – 2023-06-14 (×2): 1 via TRANSDERMAL
  Filled 2023-06-13 (×2): qty 1

## 2023-06-13 MED ORDER — SENNOSIDES-DOCUSATE SODIUM 8.6-50 MG PO TABS: 1.0000 | ORAL_TABLET | Freq: Two times a day (BID) | ORAL | Status: DC | PRN

## 2023-06-13 MED ORDER — ONDANSETRON 4 MG PO TBDP: 4.0000 mg | ORAL_TABLET | Freq: Four times a day (QID) | ORAL | Status: DC | PRN

## 2023-06-13 MED ORDER — EMPAGLIFLOZIN 25 MG PO TABS
25.0000 mg | ORAL_TABLET | Freq: Every day | ORAL | Status: DC
  Administered 2023-06-15: 25 mg via ORAL
  Filled 2023-06-13 (×3): qty 1

## 2023-06-13 MED ORDER — MORPHINE SULFATE (PF) 2 MG/ML IV SOLN
1.0000 mg | INTRAVENOUS | Status: DC | PRN
  Administered 2023-06-13 – 2023-06-14 (×2): 1 mg via INTRAVENOUS
  Filled 2023-06-13 (×2): qty 1

## 2023-06-13 MED ORDER — TRAMADOL HCL 50 MG PO TABS
50.0000 mg | ORAL_TABLET | Freq: Four times a day (QID) | ORAL | Status: DC | PRN
  Administered 2023-06-13 – 2023-06-14 (×3): 50 mg via ORAL
  Filled 2023-06-13 (×4): qty 1

## 2023-06-13 MED ORDER — TRIAMCINOLONE ACETONIDE 0.1 % EX CREA
1.0000 | TOPICAL_CREAM | Freq: Two times a day (BID) | CUTANEOUS | Status: DC
  Administered 2023-06-13 – 2023-06-15 (×4): 1 via TOPICAL
  Filled 2023-06-13 (×2): qty 15

## 2023-06-13 MED ORDER — ONDANSETRON HCL 4 MG/2ML IJ SOLN: 4.0000 mg | Freq: Four times a day (QID) | INTRAMUSCULAR | Status: DC | PRN

## 2023-06-13 MED ORDER — TORSEMIDE 20 MG PO TABS
20.0000 mg | ORAL_TABLET | Freq: Every day | ORAL | Status: DC
  Administered 2023-06-14 – 2023-06-15 (×2): 20 mg via ORAL
  Filled 2023-06-13 (×3): qty 1

## 2023-06-13 MED ORDER — TRAMADOL HCL 50 MG PO TABS: 25.0000 mg | ORAL_TABLET | Freq: Four times a day (QID) | ORAL | Status: DC | PRN

## 2023-06-13 MED ORDER — PIOGLITAZONE HCL 45 MG PO TABS
45.0000 mg | ORAL_TABLET | Freq: Every day | ORAL | Status: DC
  Filled 2023-06-13: qty 1

## 2023-06-13 MED ORDER — ENOXAPARIN SODIUM 30 MG/0.3ML IJ SOSY
30.0000 mg | PREFILLED_SYRINGE | Freq: Two times a day (BID) | INTRAMUSCULAR | Status: DC
  Administered 2023-06-13 – 2023-06-15 (×4): 30 mg via SUBCUTANEOUS
  Filled 2023-06-13 (×5): qty 0.3

## 2023-06-13 MED ORDER — METHOCARBAMOL 500 MG PO TABS
500.0000 mg | ORAL_TABLET | Freq: Three times a day (TID) | ORAL | Status: DC
  Administered 2023-06-13 – 2023-06-15 (×8): 500 mg via ORAL
  Filled 2023-06-13 (×8): qty 1

## 2023-06-13 NOTE — Evaluation (Signed)
 Physical Therapy Evaluation Patient Details Name: Cheryl Delgado MRN: [id number] DOB: 11-23-1945 Today's Date: 06/13/2023  History of Present Illness  Patient is 78 y.o. female presented to [facility] due to a fall 2 days prior resulting in Lt sided chest pain. Pt found to have Lt rib fx's 6-10 and trace pleural effusion with no pneumothorax. Pt admitted for pain management and mobility. PMH significant for HTN, DMII, vertigo.   Clinical Impression  Cheryl Delgado is 78 y.o. female admitted with above HPI and diagnosis. Patient is currently limited by functional impairments below (see PT problem list). Patient lives with spouse and is independent with no AD at baseline. Currently pt mobilizing at [facility] level for transfers and gait with no AD. Patient will benefit from continued skilled PT interventions to address impairments and progress independence with mobility, recommend OPPT follow up for balance training. Acute PT will follow and progress as able.      If plan is discharge home, recommend the following: Assist for transportation;Help with stairs or ramp for entrance   Can travel by private vehicle        Equipment Recommendations None recommended by PT  Recommendations for Other Services       Functional Status Assessment Patient has had a recent decline in their functional status and demonstrates the ability to make significant improvements in function in a reasonable and predictable amount of time.     Precautions / Restrictions Precautions Precautions: Fall Recall of Precautions/Restrictions: Intact Restrictions Weight Bearing Restrictions Per Provider Order: No      Mobility  Bed Mobility               General bed mobility comments: pt OOB in recliner    Transfers Overall transfer level: Needs assistance Equipment used: None Transfers: Sit to/from Stand Sit to Stand: Contact guard assist           General transfer comment: pt taking extra time, limited by pain  in Lt side, relying on Rt>Lt UE to power up. CGA for safety.    Ambulation/Gait Ambulation/Gait assistance: Contact guard assist Gait Distance (Feet): 75 Feet Assistive device: None Gait Pattern/deviations: Step-to pattern, Step-through pattern, Decreased stride length, Wide base of support, Drifts right/left Gait velocity: decr     General Gait Details: slow cautious pace,  Stairs            Wheelchair Mobility     Tilt Bed    Modified Rankin (Stroke Patients Only)       Balance Overall balance assessment: Needs assistance Sitting-balance support: Feet supported Sitting balance-Leahy Scale: Good     Standing balance support: No upper extremity supported, During functional activity Standing balance-Leahy Scale: Good                               Pertinent Vitals/Pain Pain Assessment Pain Assessment: Faces Faces Pain Scale: Hurts even more Pain Location: Lt sid eof chest with transitional movement Pain Descriptors / Indicators: Discomfort, Grimacing, Guarding Pain Intervention(s): Limited activity within patient's tolerance, Monitored during session, Repositioned    Home Living Family/patient expects to be discharged to:: Private residence Living Arrangements: Spouse/significant other Available Help at Discharge: Family Type of Home: House Home Access: Stairs to enter Entrance Stairs-Rails: Right Entrance Stairs-Number of Steps: 2   Home Layout: One level Home Equipment: None      Prior Function Prior Level of Function : Independent/Modified Independent  Mobility Comments: ind no AD ADLs Comments: ind with ADL's and house tasks     Extremity/Trunk Assessment   Upper Extremity Assessment Upper Extremity Assessment: Overall WFL for tasks assessed    Lower Extremity Assessment Lower Extremity Assessment: Overall WFL for tasks assessed    Cervical / Trunk Assessment Cervical / Trunk Assessment: Other  exceptions Cervical / Trunk Exceptions: habitus, Lt rib fx's  Communication   Communication Communication: No apparent difficulties    Cognition Arousal: Alert Behavior During Therapy: WFL for tasks assessed/performed   PT - Cognitive impairments: No apparent impairments                         Following commands: Intact       Cueing Cueing Techniques: Verbal cues     General Comments      Exercises     Assessment/Plan    PT Assessment Patient needs continued PT services  PT Problem List Decreased activity tolerance;Decreased balance;Decreased mobility;Decreased knowledge of use of DME;Obesity;Pain       PT Treatment Interventions Patient/family education;Balance training;Therapeutic exercise;Therapeutic activities;Functional mobility training;Stair training;Gait training;DME instruction    PT Goals (Current goals can be found in the Care Plan section)  Acute Rehab PT Goals Patient Stated Goal: recover strength PT Goal Formulation: With patient Time For Goal Achievement: 06/27/23 Potential to Achieve Goals: Good    Frequency Min 1X/week     Co-evaluation               AM-PAC PT "6 Clicks" Mobility  Outcome Measure Help needed turning from your back to your side while in a flat bed without using bedrails?: A Lot Help needed moving from lying on your back to sitting on the side of a flat bed without using bedrails?: A Lot Help needed moving to and from a bed to a chair (including a wheelchair)?: A Little Help needed standing up from a chair using your arms (e.g., wheelchair or bedside chair)?: A Little Help needed to walk in hospital room?: A Little Help needed climbing 3-5 steps with a railing? : A Little 6 Click Score: 16    End of Session Equipment Utilized During Treatment: Gait belt Activity Tolerance: Patient tolerated treatment well Patient left: in chair;with call bell/phone within reach Nurse Communication: Mobility status PT Visit  Diagnosis: Other abnormalities of gait and mobility (R26.89);Difficulty in walking, not elsewhere classified (R26.2);Pain Pain - Right/Left: Left Pain - part of body:  (ribs)    Time: 4098-1191 PT Time Calculation (min) (ACUTE ONLY): 18 min   Charges:   PT Evaluation $PT Eval Low Complexity: 1 Low   PT General Charges $$ ACUTE PT VISIT: 1 Visit         Wynn Maudlin, DPT Acute Rehabilitation Services Office [phone]  06/13/23 2:12 PM
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
## 2023-06-13 NOTE — ED Notes (Signed)
 Called to check on transport.  S/w [facility] and was advised they are on the way.

## 2023-06-13 NOTE — ED Notes (Signed)
 Called [facility] to transport patient to [facility] Rm#[id number]

## 2023-06-13 NOTE — Progress Notes (Signed)
 Transition of Care [facility]) - CAGE-AID Screening   Patient Details  Name: Cheryl Delgado MRN: [id number] Date of Birth: [birth year]   Hewitt Shorts, RN Trauma Response Nurse Phone Number: [phone] 06/13/2023, 6:57 PM   CAGE-AID Screening:    Have You Ever Felt You Ought to Cut Down on Your Drinking or Drug Use?: No Have People Annoyed You By Critizing Your Drinking Or Drug Use?: No Have You Felt Bad Or Guilty About Your Drinking Or Drug Use?: No Have You Ever Had a Drink or Used Drugs First Thing In The Morning to Steady Your Nerves or to Get Rid of a Hangover?: No CAGE-AID Score: 0  Substance Abuse Education Offered: No (No services needed)

## 2023-06-13 NOTE — ED Notes (Signed)
 Pt sp02 dropped into the 88% . RT assessed and order placed for [facility]. Pt placed on 2L [place].

## 2023-06-14 LAB — GLUCOSE, CAPILLARY
Glucose-Capillary: 131 mg/dL — ABNORMAL HIGH (ref 70–99)
Glucose-Capillary: 163 mg/dL — ABNORMAL HIGH (ref 70–99)
Glucose-Capillary: 132 mg/dL — ABNORMAL HIGH (ref 70–99)
Glucose-Capillary: 162 mg/dL — ABNORMAL HIGH (ref 70–99)

## 2023-06-14 MED ORDER — ROSUVASTATIN CALCIUM 5 MG PO TABS
10.0000 mg | ORAL_TABLET | Freq: Every day | ORAL | Status: DC
  Administered 2023-06-14 – 2023-06-15 (×2): 10 mg via ORAL
  Filled 2023-06-14 (×2): qty 2

## 2023-06-14 NOTE — Progress Notes (Signed)
 Subjective: Sitting in bedside chair. She reports worsening pain this morning as well as some cramping that she had not felt before. She was able to ambulate in the halls.   ROS: See above, otherwise other systems negative  Objective: Vital signs in last 24 hours: Temp:  [97.8 F (36.6 C)-98.7 F (37.1 C)] 98.7 F (37.1 C) (03/01 0735) Pulse Rate:  [67-90] 83 (03/01 0735) Resp:  [16-19] 16 (03/01 0735) BP: (118-155)/(52-77) 135/63 (03/01 0735) SpO2:  [90 %-98 %] 90 % (03/01 0735)    Intake/Output from previous day: 02/28 0701 - 03/01 0700 In: 193.5 [I.V.:193.5] Out: -  Intake/Output this shift: No intake/output data recorded.  PE: Gen: female, NAD Resp: equal chest rise, on RA Abd: soft, non-distended  Lab Results:  Recent Labs    06/12/23 1954 06/13/23 0519  WBC 4.9 5.2  HGB 10.4* 10.0*  HCT 33.1* 31.8*  PLT 164 162   BMET Recent Labs    06/12/23 1954 06/13/23 0519  NA 139 138  K 4.7 4.0  CL 102 104  CO2 30 29  GLUCOSE 102* 176*  BUN 31* 25*  CREATININE 0.78 0.72  CALCIUM 9.7 9.3   PT/INR No results for input(s): "LABPROT", "INR" in the last 72 hours. CMP     Component Value Date/Time   NA 138 06/13/2023 0519   K 4.0 06/13/2023 0519   CL 104 06/13/2023 0519   CO2 29 06/13/2023 0519   GLUCOSE 176 (H) 06/13/2023 0519   BUN 25 (H) 06/13/2023 0519   CREATININE 0.72 06/13/2023 0519   CALCIUM 9.3 06/13/2023 0519   PROT 7.2 04/22/2023 1334   ALBUMIN 4.1 04/22/2023 1334   AST 17 04/22/2023 1334   ALT 8 04/22/2023 1334   ALKPHOS 66 04/22/2023 1334   BILITOT 0.4 04/22/2023 1334   GFRNONAA >60 06/13/2023 0519   GFRAA 161 12/04/2007 1347   Lipase  No results found for: "LIPASE"  Studies/Results: CT Chest W Contrast Result Date: 06/12/2023 CLINICAL DATA:  Chest trauma.  Fall. EXAM: CT CHEST WITH CONTRAST TECHNIQUE: Multidetector CT imaging of the chest was performed during intravenous contrast administration. RADIATION DOSE REDUCTION: This  exam was performed according to the departmental dose-optimization program which includes automated exposure control, adjustment of the mA and/or kV according to patient size and/or use of iterative reconstruction technique. CONTRAST:  75mL OMNIPAQUE IOHEXOL 300 MG/ML  SOLN COMPARISON:  None Available. FINDINGS: Cardiovascular: The heart is mildly enlarged. There is no pericardial effusion. Aorta is normal in size. Mediastinum/Nodes: No enlarged mediastinal, hilar, or axillary lymph nodes. Thyroid gland, trachea, and esophagus demonstrate no significant findings. There is a small hiatal hernia. Lungs/Pleura: There is a trace left pleural effusion. There is a small amount of atelectasis/airspace consolidation in the inferior left lower lobe. The lungs are otherwise clear. No pneumothorax visualized. Upper Abdomen: No acute abnormality. Musculoskeletal: There are acute nondisplaced anterior left sixth, seventh, eighth and ninth rib fractures. There are acute posterior left eighth, ninth and tenth rib fractures. There is inferior left chest wall edema near fracture sites. IMPRESSION: 1. Acute left sixth through tenth rib fractures. 2. Trace left pleural effusion. No pneumothorax. 3. Small amount of atelectasis/airspace consolidation in the inferior left lower lobe. 4. Mild cardiomegaly. 5. Small hiatal hernia. Electronically Signed   By: Darliss Cheney M.D.   On: 06/12/2023 22:38   DG Humerus Left Result Date: 06/12/2023 CLINICAL DATA:  Pain after fall. EXAM: LEFT HUMERUS - 2+ VIEW COMPARISON:  Left  humerus radiographs dated 03/16/2008. FINDINGS: There is no evidence of fracture or other focal bone lesions. Soft tissues are unremarkable. IMPRESSION: No acute osseous abnormality. Electronically Signed   By: Hart Robinsons M.D.   On: 06/12/2023 18:38   DG Ribs Unilateral W/Chest Left Result Date: 06/12/2023 CLINICAL DATA:  Pain after fall. EXAM: LEFT RIBS AND CHEST - 3+ VIEW COMPARISON:  Chest radiograph dated  01/29/2016. FINDINGS: Suspected contour irregularity along the left lateral eighth and ninth ribs could represent nondisplaced fractures. Increased density at the left lung base and costophrenic angle. No pneumothorax. Heart size is borderline enlarged, unchanged. Mediastinal contours are within normal limits. IMPRESSION: Suspected contour irregularity along the left lateral eighth and ninth ribs could represent nondisplaced fractures. Increased density at the left lung base and costophrenic angle may reflect atelectasis, small effusion, or contusion. No pneumothorax identified. Consider further evaluation with CT chest. Electronically Signed   By: Hart Robinsons M.D.   On: 06/12/2023 18:37    Anti-infectives: Anti-infectives (From admission, onward)    None       Assessment/Plan  78 y/o F who presented after a mechanical fall  R 6-10 rib fx - multimodal pain control, aggressive pulmonary toilet, mobilize DM2  - home meds HTN - home meds reordered  HLD - home statin ordered    FEN - carb controlled VTE - lovenox ID - none Dispo - home when pain is better controlled, possibly tomorrow   I reviewed last 24 h vitals and pain scores, last 48 h intake and output, last 24 h labs and trends, and last 24 h imaging results.  This care required moderate level of medical decision making.    LOS: 0 days   Tacy Learn Surgery 06/14/2023, 10:21 AM Please see Amion for pager number during day hours 7:00am-4:30pm or 7:00am -11:30am on weekends

## 2023-06-14 NOTE — Plan of Care (Signed)
  Problem: Safety: Goal: Ability to remain free from injury will improve Outcome: Progressing   Problem: Pain Managment: Goal: General experience of comfort will improve and/or be controlled Outcome: Progressing

## 2023-06-14 NOTE — Progress Notes (Signed)
 Mobility Specialist Progress Note:    06/14/23 1110  Mobility  Activity Ambulated with assistance in room  Level of Assistance Contact guard assist, steadying assist  Assistive Device None  Distance Ambulated (ft) 80 ft  Activity Response Tolerated well  Mobility Referral Yes  Mobility visit 1 Mobility  Mobility Specialist Start Time (ACUTE ONLY) 1110  Mobility Specialist Stop Time (ACUTE ONLY) 1118  Mobility Specialist Time Calculation (min) (ACUTE ONLY) 8 min   Pt received in chair, agreeable to ambulate in room. Tolerated well, CGA for safety, no AD required. Pt reported 10/10 rib fx pain. Returned pt to chair. NT in room. Left with all needs met.   Feliciana Rossetti Mobility Specialist Please contact via Special educational needs teacher or  Rehab office at [phone]

## 2023-06-14 NOTE — Evaluation (Addendum)
 Occupational Therapy Evaluation and Discharge Patient Details Name: Cheryl Delgado MRN: [id number] DOB: 10-Dec-1945 Today's Date: 06/14/2023   History of Present Illness   Patient is 78 y.o. female presented to [facility] due to a fall 2 days prior resulting in Lt sided chest pain. Pt found to have Lt rib fx's 6-10 and trace pleural effusion with no pneumothorax. Pt admitted for pain management and mobility. PMH significant for HTN, DMII, vertigo.     Clinical Impressions Pt admitted with the above diagnoses and presents with below problem list. Pt will benefit from continued acute OT to address the below listed deficits and maximize independence with basic ADLs. At baseline, pt is independent with ADLs. Pt currently needs up to CGA with LB ADLs and functional mobility. Discussed compensatory strategies for UB/LB ADLs for comfort (L flank pain). No further acute OT needs indicated. OT signing off.       If plan is discharge home, recommend the following:         Functional Status Assessment   Patient has not had a recent decline in their functional status     Equipment Recommendations   None recommended by OT     Recommendations for Other Services         Precautions/Restrictions   Precautions Precautions: Fall Recall of Precautions/Restrictions: Intact Restrictions Weight Bearing Restrictions Per Provider Order: No     Mobility Bed Mobility               General bed mobility comments: pt OOB in recliner    Transfers Overall transfer level: Needs assistance Equipment used: None Transfers: Sit to/from Stand Sit to Stand: Contact guard assist                  Balance Overall balance assessment: Needs assistance Sitting-balance support: Feet supported Sitting balance-Leahy Scale: Good     Standing balance support: No upper extremity supported, During functional activity Standing balance-Leahy Scale: Good                              ADL either performed or assessed with clinical judgement   ADL Overall ADL's : Needs assistance/impaired Eating/Feeding: Set up;Sitting   Grooming: Set up;Sitting   Upper Body Bathing: Set up;Sitting   Lower Body Bathing: Contact guard assist;Sit to/from stand   Upper Body Dressing : Set up;Sitting   Lower Body Dressing: Contact guard assist;Sit to/from stand   Toilet Transfer: Contact guard assist   Toileting- Clothing Manipulation and Hygiene: Contact guard assist   Tub/ Shower Transfer: Contact guard assist   Functional mobility during ADLs: Contact guard assist General ADL Comments: Education provided on compensatory strategies with UB/LB ADLs.     Vision Baseline Vision/History: 1 Wears glasses       Perception         Praxis         Pertinent Vitals/Pain Pain Assessment Pain Assessment: Faces Faces Pain Scale: Hurts whole lot Pain Location: Left flank at rest, sitting in recliner Pain Descriptors / Indicators: Discomfort, Grimacing, Guarding Pain Intervention(s): Limited activity within patient's tolerance, Monitored during session, Repositioned, RN gave pain meds during session     Extremity/Trunk Assessment Upper Extremity Assessment Upper Extremity Assessment: Overall WFL for tasks assessed   Lower Extremity Assessment Lower Extremity Assessment: Overall WFL for tasks assessed;Defer to PT evaluation   Cervical / Trunk Assessment Cervical / Trunk Assessment: Other exceptions Cervical / Trunk Exceptions: habitus, Lt rib  fx's   Communication Communication Communication: No apparent difficulties   Cognition Arousal: Alert Behavior During Therapy: WFL for tasks assessed/performed Cognition: No apparent impairments                               Following commands: Intact       Cueing  General Comments          Exercises     Shoulder Instructions      Home Living Family/patient expects to be discharged to:: Private  residence Living Arrangements: Spouse/significant other Available Help at Discharge: Family Type of Home: House Home Access: Stairs to enter Secretary/administrator of Steps: 2 Entrance Stairs-Rails: Right Home Layout: One level     Bathroom Shower/Tub: Engineer, production Accessibility: Yes   Home Equipment: None          Prior Functioning/Environment Prior Level of Function : Independent/Modified Independent             Mobility Comments: ind no AD ADLs Comments: ind with ADL's and house tasks    OT Problem List:     OT Treatment/Interventions:        OT Goals(Current goals can be found in the care plan section)   Acute Rehab OT Goals Patient Stated Goal: not stated   OT Frequency:       Co-evaluation              AM-PAC OT "6 Clicks" Daily Activity     Outcome Measure Help from another person eating meals?: None Help from another person taking care of personal grooming?: None Help from another person toileting, which includes using toliet, bedpan, or urinal?: A Little Help from another person bathing (including washing, rinsing, drying)?: None Help from another person to put on and taking off regular upper body clothing?: A Little Help from another person to put on and taking off regular lower body clothing?: None 6 Click Score: 22   End of Session    Activity Tolerance: Patient limited by pain Patient left: in chair;with call bell/phone within reach  OT Visit Diagnosis: Pain Pain - Right/Left: Left Pain - part of body:  (flank)                Time: 0272-5366 OT Time Calculation (min): 8 min Charges:  OT General Charges $OT Visit: 1 Visit OT Evaluation $OT Eval Low Complexity: 1 Low  Raynald Kemp, OT Acute Rehabilitation Services Office: [phone]   Pilar Grammes 06/14/2023, 1:43 PM

## 2023-06-14 NOTE — Care Management Obs Status (Signed)
 MEDICARE OBSERVATION STATUS NOTIFICATION   Patient Details  Name: Cheryl Delgado MRN: [id number] Date of Birth: 03/11/1946   Medicare Observation Status Notification Given:  Yes    Ronny Bacon, RN 06/14/2023, 8:21 AM

## 2023-06-15 LAB — GLUCOSE, CAPILLARY
Glucose-Capillary: 128 mg/dL — ABNORMAL HIGH (ref 70–99)
Glucose-Capillary: 152 mg/dL — ABNORMAL HIGH (ref 70–99)

## 2023-06-15 MED ORDER — POLYETHYLENE GLYCOL 3350 17 G PO PACK: 17.0000 g | PACK | Freq: Every day | ORAL | Status: AC | PRN

## 2023-06-15 MED ORDER — TRAMADOL HCL 50 MG PO TABS: 50.0000 mg | ORAL_TABLET | Freq: Four times a day (QID) | ORAL | 0 refills | Status: DC | PRN

## 2023-06-15 MED ORDER — DOCUSATE SODIUM 100 MG PO CAPS: 100.0000 mg | ORAL_CAPSULE | Freq: Two times a day (BID) | ORAL | Status: AC | PRN
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
## 2023-06-15 NOTE — TOC Transition Note (Signed)
 Transition of Care [facility]) - Discharge Note   Patient Details  Name: Cheryl Delgado MRN: [id number] Date of Birth: 09/09/1945  Transition of Care [facility]) CM/SW Contact:  Ronny Bacon, RN Phone Number: 06/15/2023, 11:16 AM   Clinical Narrative:   Secure message received from provider that patient is being discharged today and needs outpatient physical therapy.  Referral # [id number] placed for [facility]. Contact information placed on AVS,    Final next level of care: Home/Self Care Barriers to Discharge: No Barriers Identified   Patient Goals and CMS Choice            Discharge Placement                       Discharge Plan and Services Additional resources added to the After Visit Summary for                                       Social Drivers of Health (SDOH) Interventions SDOH Screenings   Food Insecurity: No Food Insecurity (06/13/2023)  Housing: Low Risk  (06/13/2023)  Transportation Needs: No Transportation Needs (06/13/2023)  Utilities: Not At Risk (06/13/2023)  Alcohol Screen: Low Risk  (09/17/2022)  Depression (PHQ2-9): Low Risk  (05/05/2023)  Financial Resource Strain: Low Risk  (09/17/2022)  Physical Activity: Inactive (09/17/2022)  Social Connections: Socially Integrated (06/13/2023)  Stress: No Stress Concern Present (09/17/2022)  Tobacco Use: High Risk (06/13/2023)     Readmission Risk Interventions     No data to display

## 2023-06-15 NOTE — Discharge Instructions (Signed)
RIB FRACTURES  HOME INSTRUCTIONS   PAIN CONTROL:  Pain is best controlled by a usual combination of three different methods TOGETHER:  Ice/Heat Over the counter pain medication Prescription pain medication You may experience some swelling and bruising in area of broken ribs. Ice packs or heating pads (30-60 minutes up to 6 times a day) will help. Use ice for the first few days to help decrease swelling and bruising, then switch to heat to help relax tight/sore spots and speed recovery. Some people prefer to use ice alone, heat alone, alternating between ice & heat. Experiment to what works for you. Swelling and bruising can take several weeks to resolve.  It is helpful to take an over-the-counter pain medication regularly for the first few weeks. Choose one of the following that works best for you:  Naproxen (Aleve, etc) Two 280m tabs twice a day Ibuprofen (Advil, etc) Three 2043mtabs four times a day (every meal & bedtime) Acetaminophen (Tylenol, etc) 500-6503mour times a day (every meal & bedtime) A prescription for pain medication (such as oxycodone, hydrocodone, etc) may be given to you upon discharge. Take your pain medication as prescribed.  If you are having problems/concerns with the prescription medicine (does not control pain, nausea, vomiting, rash, itching, etc), please call us [phone] see if we need to switch you to a different pain medicine that will work better for you and/or control your side effect better. If you need a refill on your pain medication, please contact your pharmacy. They will contact our office to request authorization. Prescriptions will not be filled after 5 pm or on week-ends. Avoid getting constipated. When taking pain medications, it is common to experience some constipation. Increasing fluid intake and taking a fiber supplement (such as Metamucil, Citrucel, FiberCon, MiraLax, etc) 1-2 times a day regularly will usually help prevent this problem  from occurring. A mild laxative (prune juice, Milk of Magnesia, MiraLax, etc) should be taken according to package directions if there are no bowel movements after 48 hours.  Watch out for diarrhea. If you have many loose bowel movements, simplify your diet to bland foods & liquids for a few days. Stop any stool softeners and decrease your fiber supplement. Switching to mild anti-diarrheal medications (Kayopectate, Pepto Bismol) can help. If this worsens or does not improve, please call us.K[place]OLLOW UP  If a follow up appointment is needed one will be scheduled for you. If none is needed with our trauma team, please follow up with your primary care provider within 2-3 weeks from discharge. Please call [facility] at [phone] if you have any questions about follow up.  If you have any orthopedic or other injuries you will need to follow up as outlined in your follow up instructions.   WHEN TO CALL US [phone] pain control Reactions / problems with new medications (rash/itching, nausea, etc)  Fever over 101.5 F (38.5 C) Worsening swelling or bruising Worsening pain, productive cough, difficulty breathing or any other concerning symptoms  The clinic staff is available to answer your questions during regular business hours (8:30am-5pm). Please don't hesitate to call and ask to speak to one of our nurses for clinical concerns.  If you have a medical emergency, go to the nearest emergency room or call 911.  A surgeon from [facility] is always on call at the [facility] [place] [id number] MAIN: [phone] ? TOLL FREE: [phone] ?  FAX [phone]  [email or web address]      Information on Rib Fractures  A rib fracture is a break or crack in one of the bones of the ribs. The ribs are long, curved bones that wrap around your chest and attach to your spine and your breastbone. The  ribs protect your heart, lungs, and other organs in the chest. A broken or cracked rib is often painful but is not usually serious. Most rib fractures heal on their own over time. However, rib fractures can be more serious if multiple ribs are broken or if broken ribs move out of place and push against other structures or organs. What are the causes? This condition is caused by: Repetitive movements with high force, such as pitching a baseball or having severe coughing spells. A direct blow to the chest, such as a sports injury, a car accident, or a fall. Cancer that has spread to the bones, which can weaken bones and cause them to break. What are the signs or symptoms? Symptoms of this condition include: Pain when you breathe in or cough. Pain when someone presses on the injured area. Feeling short of breath. How is this diagnosed? This condition is diagnosed with a physical exam and medical history. Imaging tests may also be done, such as: Chest X-ray. CT scan. MRI. Lohn scan. Chest ultrasound. How is this treated? Treatment for this condition depends on the severity of the fracture. Most rib fractures usually heal on their own in 1-3 months. Sometimes healing takes longer if there is a cough that does not stop or if there are other activities that make the injury worse (aggravating factors). While you heal, you will be given medicines to control the pain. You will also be taught deep breathing exercises. Severe injuries may require hospitalization or surgery. Follow these instructions at home: Managing pain, stiffness, and swelling If directed, apply ice to the injured area. Put ice in a plastic bag. Place a towel between your skin and the bag. Leave the ice on for 20 minutes, 2-3 times a day. Take over-the-counter and prescription medicines only as told by your health care provider. Activity Avoid a lot of activity and any activities or movements that cause pain. Be careful during  activities and avoid bumping the injured rib. Slowly increase your activity as told by your health care provider. General instructions Do deep breathing exercises as told by your health care provider. This helps prevent pneumonia, which is a common complication of a broken rib. Your health care provider may instruct you to: Take deep breaths several times a day. Try to cough several times a day, holding a pillow against the injured area. Use a device called incentive spirometer to practice deep breathing several times a day. Drink enough fluid to keep your urine pale yellow. Do not wear a rib belt or binder. These restrict breathing, which can lead to pneumonia. Keep all follow-up visits as told by your health care provider. This is important. Contact a health care provider if: You have a fever. Get help right away if: You have difficulty breathing or you are short of breath. You develop a cough that does not stop, or you cough up thick or bloody sputum. You have nausea, vomiting, or pain in your abdomen. Your pain gets worse and medicine does not help. Summary A rib fracture is a break or crack in one of the bones of the ribs. A broken or cracked rib is  often painful but is not usually serious. Most rib fractures heal on their own over time. Treatment for this condition depends on the severity of the fracture. Avoid a lot of activity and any activities or movements that cause pain. This information is not intended to replace advice given to you by your health care provider. Make sure you discuss any questions you have with your health care provider. Document Released: 04/01/2005 Document Revised: 07/01/2016 Document Reviewed: 07/01/2016 Elsevier Interactive Patient Education  2019 Elsevier Inc.  

## 2023-06-15 NOTE — Progress Notes (Signed)
Pt discharged home in stable condition after going over discharge instructions with no concerns voiced 

## 2023-06-15 NOTE — Plan of Care (Signed)
  Problem: Pain Managment: Goal: General experience of comfort will improve and/or be controlled Outcome: Progressing   Problem: Safety: Goal: Ability to remain free from injury will improve Outcome: Progressing   Problem: Skin Integrity: Goal: Risk for impaired skin integrity will decrease Outcome: Progressing

## 2023-06-19 ENCOUNTER — Encounter: Payer: Self-pay | Admitting: Internal Medicine

## 2023-06-19 ENCOUNTER — Ambulatory Visit: Admitting: Internal Medicine

## 2023-06-19 VITALS — BP 102/80 | HR 89 | Temp 97.7°F | Ht 62.0 in | Wt 231.2 lb

## 2023-06-19 DIAGNOSIS — S2242XA Multiple fractures of ribs, left side, initial encounter for closed fracture: Secondary | ICD-10-CM | POA: Diagnosis not present

## 2023-06-19 DIAGNOSIS — E118 Type 2 diabetes mellitus with unspecified complications: Secondary | ICD-10-CM

## 2023-06-19 DIAGNOSIS — Z7984 Long term (current) use of oral hypoglycemic drugs: Secondary | ICD-10-CM

## 2023-06-19 MED ORDER — RYBELSUS 7 MG PO TABS: 7.0000 mg | ORAL_TABLET | Freq: Every day | ORAL | 3 refills | Status: AC

## 2023-06-19 MED ORDER — RYBELSUS 7 MG PO TABS
7.0000 mg | ORAL_TABLET | Freq: Every day | ORAL | 3 refills | Status: DC
Start: 2023-06-19 — End: 2023-06-19

## 2023-06-19 MED ORDER — CYCLOBENZAPRINE HCL 5 MG PO TABS: 5.0000 mg | ORAL_TABLET | Freq: Three times a day (TID) | ORAL | 1 refills | Status: AC | PRN

## 2023-06-19 MED ORDER — TRAMADOL HCL 50 MG PO TABS: 50.0000 mg | ORAL_TABLET | Freq: Four times a day (QID) | ORAL | 0 refills | Status: AC | PRN

## 2023-06-19 NOTE — Assessment & Plan Note (Signed)
 She had to stop jardiance due to skin yeast infection and this has cleared. We will continue metformin 1000 mg BID and actos 45 mg daily and amaryl 4 mg daily. We will add rybelsus 7 mg daily (she did well on this previously stopped due to cost). Follow up 2-3 months for HgA1c.

## 2023-06-19 NOTE — Patient Instructions (Signed)
 We will send in the tramadol and the muscle relaxer to use for pain.  We have sent in rybelsus to take for the sugars instead.

## 2023-06-19 NOTE — Assessment & Plan Note (Signed)
 She is in moderate to severe pain. Tramadol is helping refilled today. She does not have muscle relaxer as the hospital did not prescribe this (they mistakenly thought she had although we had prescribed a 2 week course in January she did not have any). Refilled cyclobenzaprine to help with pain and use tylenol as well. Reviewed expected timeline and expectation. Using incentive spirometer and encouraged continued use.

## 2023-06-19 NOTE — Progress Notes (Signed)
   Subjective:   Patient ID: Cheryl Delgado, female    DOB: 25-Dec-1945, 78 y.o.   MRN: [id number]  HPI The patient is a 78 YO female coming in for hospital follow up (admitted for multiple rib fractures needing pain control and respiratory observation). She was discharged about 4 days ago and is still having moderate pain at rest and severe pain with movement. Using incentive spirometer.   PMH, [facility], social history reviewed and updated  Medication reconciliation done at visit from discharge list and updated in epic.  Review of Systems  Constitutional: Negative.   HENT: Negative.    Eyes: Negative.   Respiratory:  Positive for chest tightness. Negative for cough and shortness of breath.   Cardiovascular:  Positive for chest pain. Negative for palpitations and leg swelling.  Gastrointestinal:  Negative for abdominal distention, abdominal pain, constipation, diarrhea, nausea and vomiting.  Musculoskeletal: Negative.   Skin: Negative.   Neurological: Negative.   Psychiatric/Behavioral: Negative.      Objective:  Physical Exam Constitutional:      Appearance: She is well-developed.  HENT:     Head: Normocephalic and atraumatic.  Cardiovascular:     Rate and Rhythm: Normal rate and regular rhythm.  Pulmonary:     Effort: Pulmonary effort is normal. No respiratory distress.     Breath sounds: Normal breath sounds. No wheezing or rales.  Chest:     Chest wall: Tenderness present.  Abdominal:     General: Bowel sounds are normal. There is no distension.     Palpations: Abdomen is soft.     Tenderness: There is no abdominal tenderness. There is no rebound.  Musculoskeletal:     Cervical back: Normal range of motion.     Right lower leg: Edema present.     Left lower leg: Edema present.  Skin:    General: Skin is warm and dry.  Neurological:     Mental Status: She is alert and oriented to person, place, and time.     Coordination: Coordination abnormal.     Comments: Slow to stand  and in pain with standing and moving     Vitals:   06/19/23 1038  BP: 102/80  Pulse: 89  Temp: 97.7 F (36.5 C)  SpO2: 93%  Weight: 231 lb 3.2 oz (104.9 kg)  Height: 5\' 2"  (1.575 m)    Assessment & Plan:

## 2023-06-27 ENCOUNTER — Inpatient Hospital Stay: Admitting: Internal Medicine

## 2023-08-11 ENCOUNTER — Telehealth: Payer: Self-pay | Admitting: Internal Medicine

## 2023-08-11 DIAGNOSIS — S2249XS Multiple fractures of ribs, unspecified side, sequela: Secondary | ICD-10-CM

## 2023-08-11 NOTE — Telephone Encounter (Signed)
 Copied from CRM [id number]. Topic: Clinical - Request for Lab/Test Order >> Aug 11, 2023  2:15 PM Albertha Alosa wrote: Reason for CRM: Starling Eck from [place] called in wanting to know if a bone density test could be order for this patient      Her call back number is [phone] 6 month from fracture

## 2023-08-12 NOTE — Telephone Encounter (Signed)
 What fracture did she have and when so we can code properly?
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
## 2023-08-27 NOTE — Telephone Encounter (Signed)
 Multiple rib fractures

## 2023-09-01 NOTE — Telephone Encounter (Signed)
Placed order for dexa

## 2023-09-10 ENCOUNTER — Ambulatory Visit (INDEPENDENT_AMBULATORY_CARE_PROVIDER_SITE_OTHER): Payer: Medicare HMO | Admitting: Podiatry

## 2023-09-10 DIAGNOSIS — M79674 Pain in right toe(s): Secondary | ICD-10-CM | POA: Diagnosis not present

## 2023-09-10 DIAGNOSIS — B351 Tinea unguium: Secondary | ICD-10-CM

## 2023-09-10 DIAGNOSIS — E118 Type 2 diabetes mellitus with unspecified complications: Secondary | ICD-10-CM

## 2023-09-10 DIAGNOSIS — B353 Tinea pedis: Secondary | ICD-10-CM

## 2023-09-10 DIAGNOSIS — M79675 Pain in left toe(s): Secondary | ICD-10-CM | POA: Diagnosis not present

## 2023-09-10 MED ORDER — TRIAMCINOLONE ACETONIDE 0.1 % EX CREA: 1.0000 | TOPICAL_CREAM | Freq: Every day | CUTANEOUS | 0 refills | Status: DC

## 2023-09-10 MED ORDER — KETOCONAZOLE 2 % EX CREA: 1.0000 | TOPICAL_CREAM | Freq: Every day | CUTANEOUS | 2 refills | Status: DC

## 2023-09-10 NOTE — Progress Notes (Signed)
  Subjective:  Patient ID: Cheryl Delgado, female    DOB: 1946-02-05,   MRN: [id number]  Chief Complaint  Patient presents with   Nail Problem    Nail trim     78 y.o. female presents for follow-up of tinea pedis. Relates it is doing better. Has been using triamcinaolone and ketoconazole .    PCP:  Adelia Homestead, MD    . Denies any other pedal complaints. Denies n/v/f/c.   Past Medical History:  Diagnosis Date   Chest pain, unspecified    Edema    peripheral   Hypertension    Type II or unspecified type diabetes mellitus without mention of complication, not stated as uncontrolled    Vertigo     Objective:  Physical Exam: Vascular: DP/PT pulses 2/4 bilateral. CFT <3 seconds. Absent hair growth on digits. Edema noted to bilateral lower extremities. Xerosis noted bilaterally.  Skin. No lacerations or abrasions bilateral feet. Nails 1-5 bilateral  are thickened discolored and elongated with subungual debris. Scaling and erythema noted to platnar left midfoot with hyperkeratosis as well.  Musculoskeletal: MMT 5/5 bilateral lower extremities in DF, PF, Inversion and Eversion. Deceased ROM in DF of ankle joint.  Neurological: Sensation intact to light touch. Protective sensation diminished bilateral.    Assessment:   1. Pain due to onychomycosis of toenails of both feet   2. Diabetes mellitus type 2 with complications (HCC)   3. Tinea pedis of left foot        Plan:  Patient was evaluated and treated and all questions answered. -Discussed and educated patient on diabetic foot care, especially with  regards to the vascular, neurological and musculoskeletal systems.  -Stressed the importance of good glycemic control and the detriment of not  controlling glucose levels in relation to the foot. -Discussed supportive shoes at all times and checking feet regularly.  -Mechanically debrided all nails 1-5 bilateral using sterile nail nipper and filed with dremel without  incident  -Answered all patient questions -Continue triamcinalone in morning and ketoconazole  at night. Refill provided.  -Patient to return  in 3 months for rfc.   Jennefer Moats, DPM

## 2023-09-15 IMAGING — CR DG TIBIA/FIBULA 2V*L*
2 series · 2 of 2 positions shown · non-contrast
Comparison: None.

CLINICAL DATA: Left leg wound with redness and swelling x2 weeks.

EXAM:
LEFT TIBIA AND FIBULA - 2 VIEW

[x tib-fib ap left]
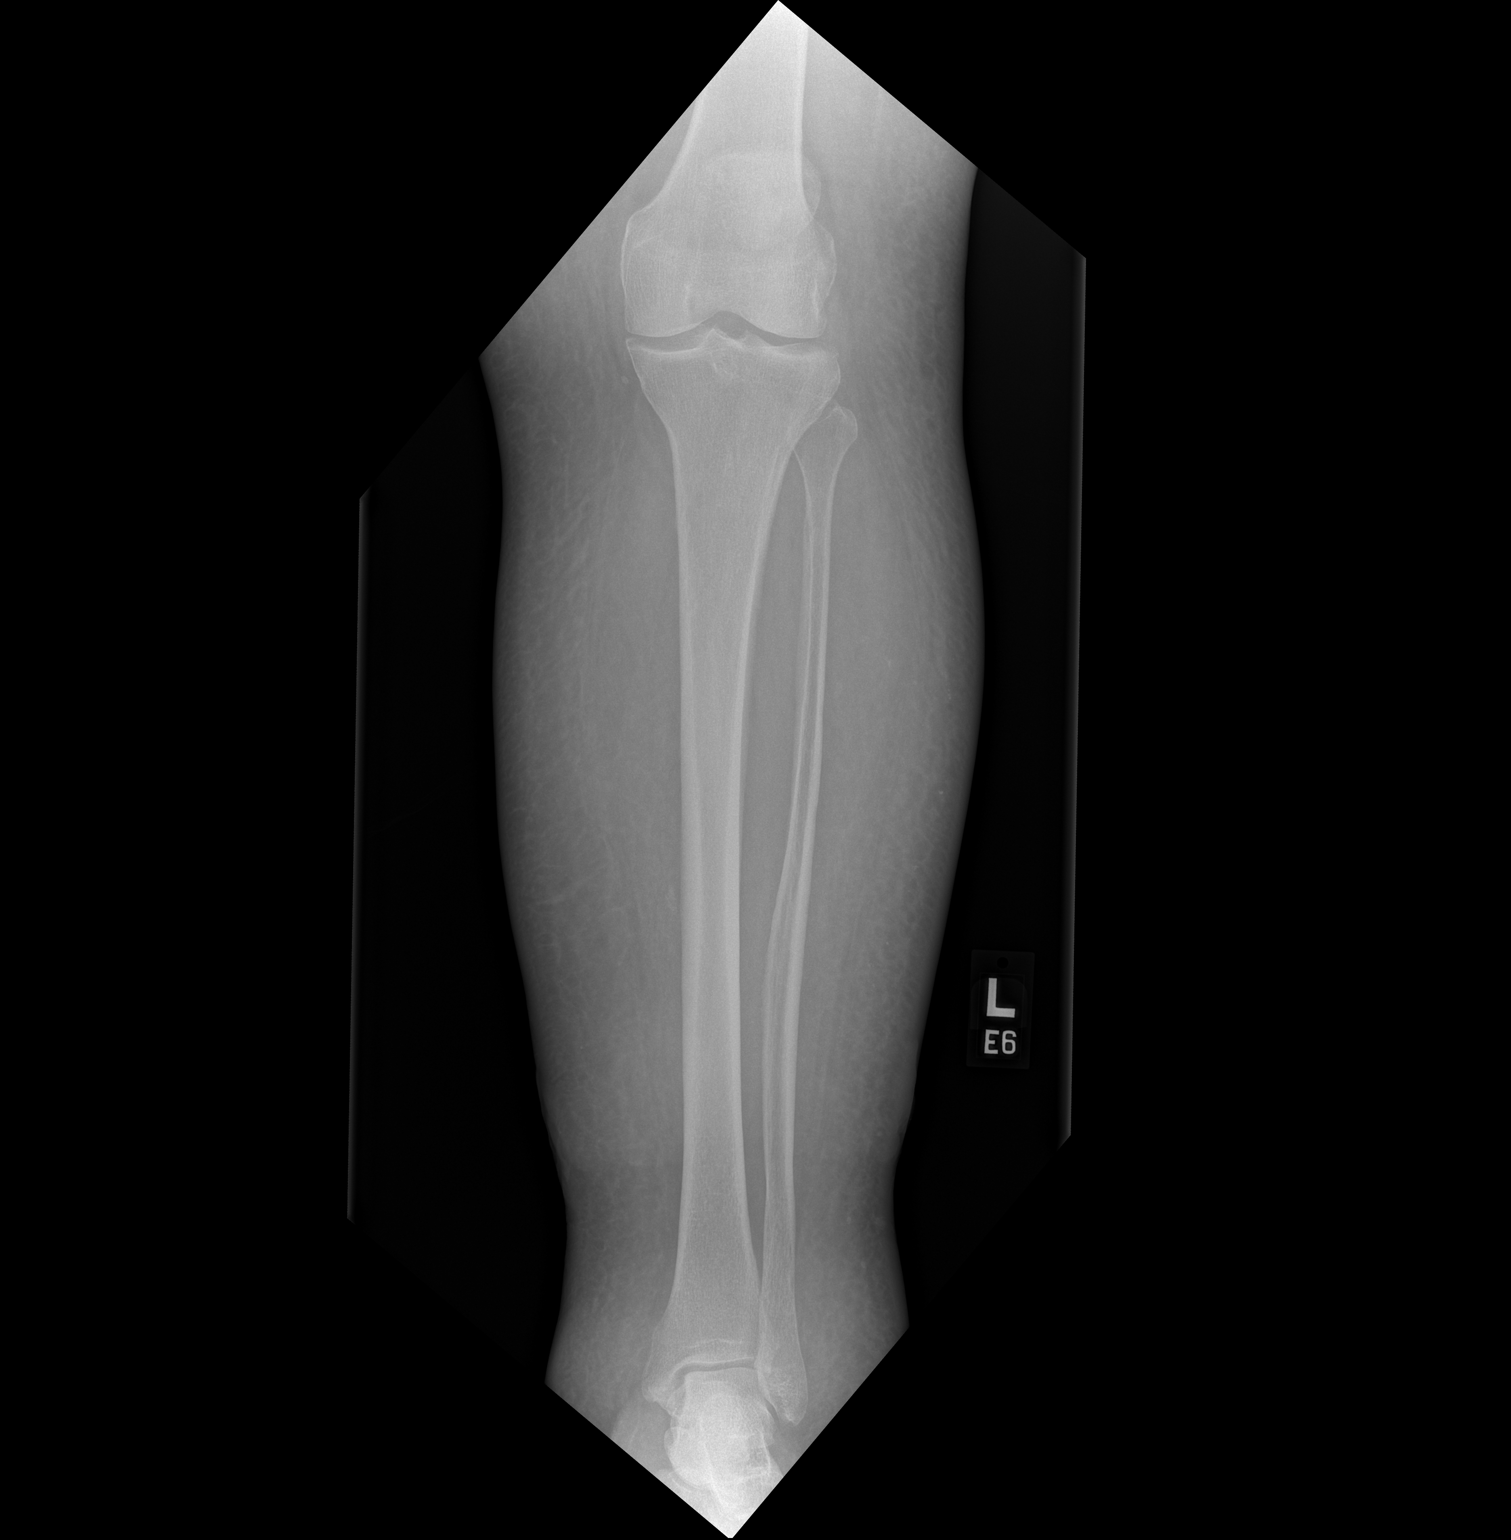

[x tib-fib lat left]
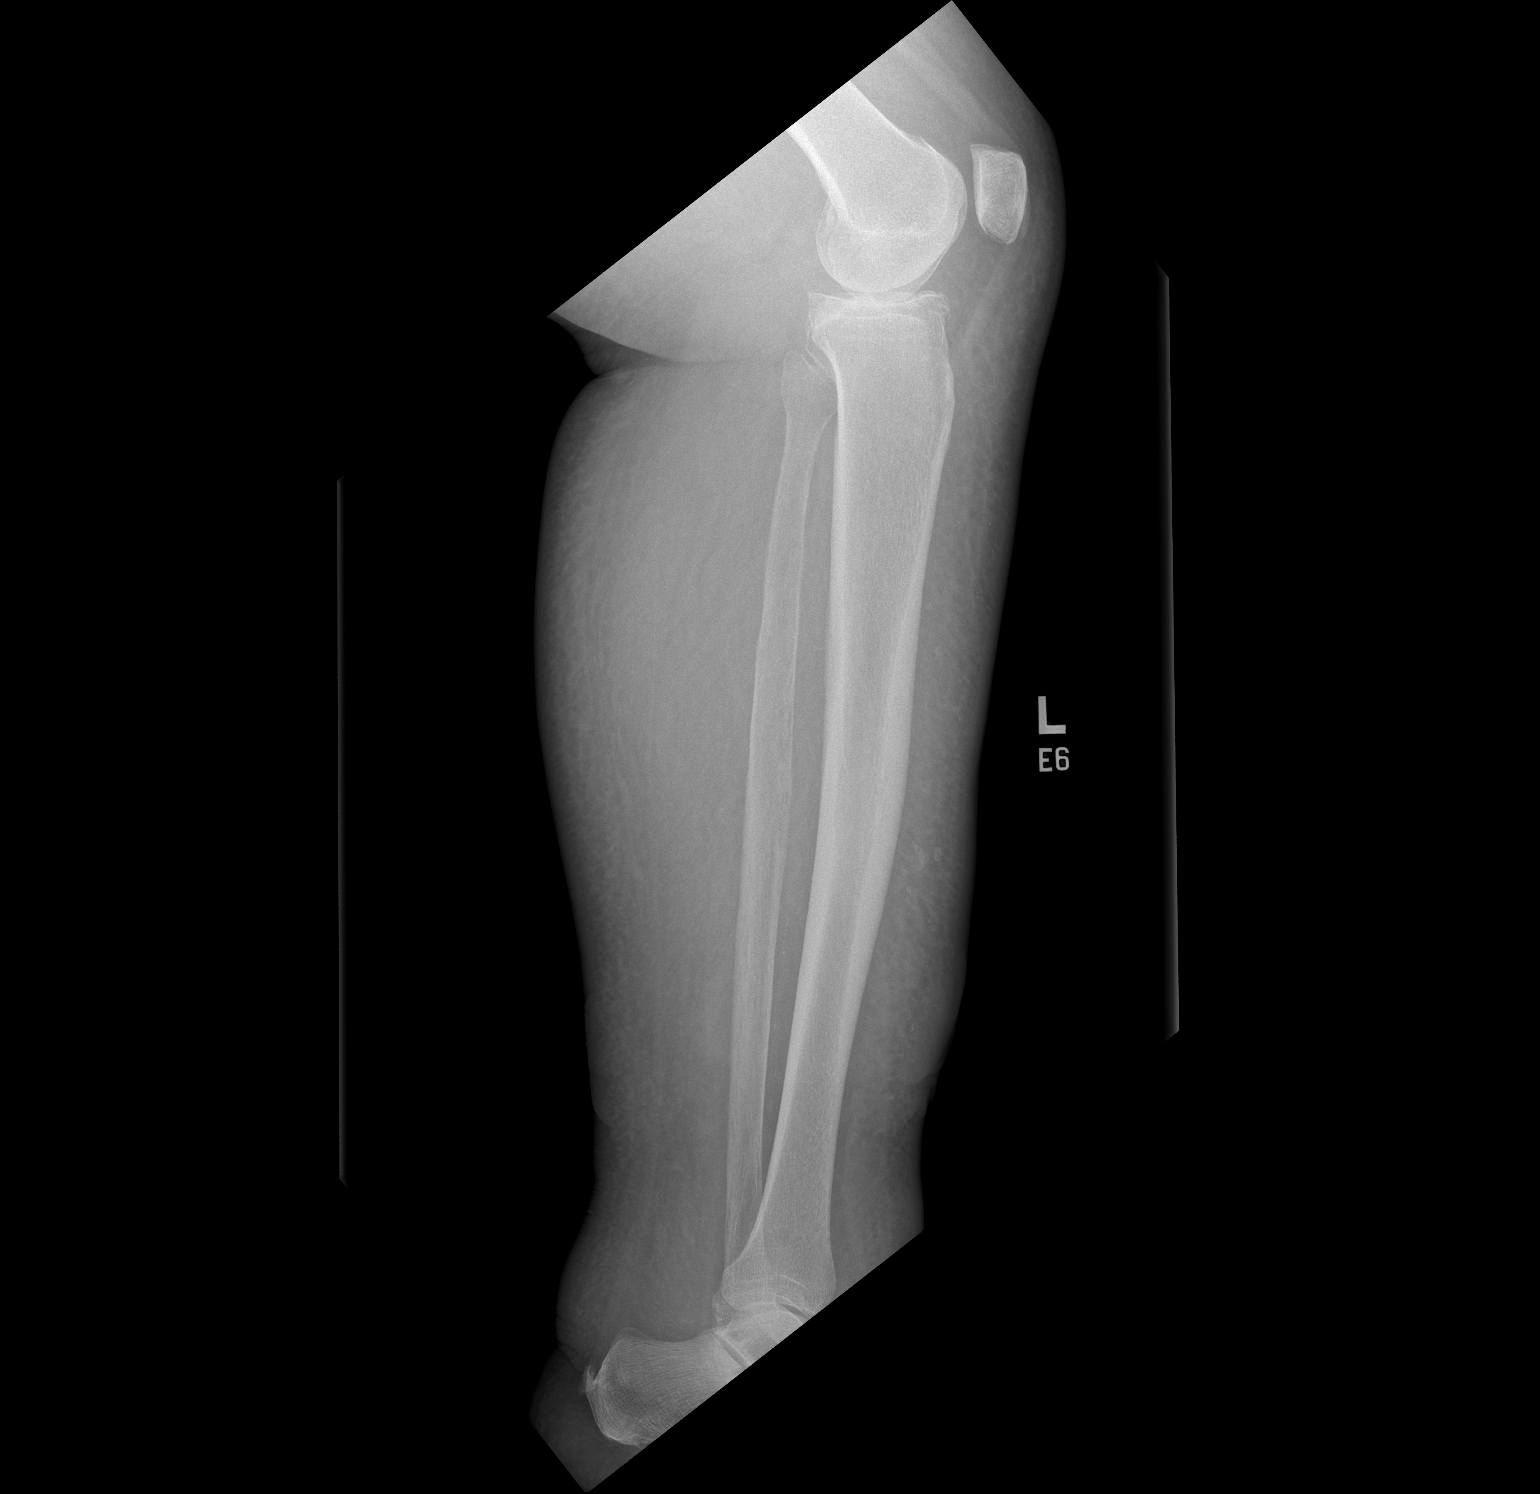

[2 of 2 positions shown; findings below may reference images not displayed]

FINDINGS: There is no evidence of fracture or other focal bone lesions.
Diffuse soft tissue swelling is seen.
IMPRESSION: 1. No acute osseous abnormality.
2. Diffuse soft tissue swelling.

## 2023-09-18 ENCOUNTER — Ambulatory Visit: Payer: Medicare HMO

## 2023-09-18 VITALS — Ht 62.0 in | Wt 231.0 lb

## 2023-09-18 DIAGNOSIS — Z1231 Encounter for screening mammogram for malignant neoplasm of breast: Secondary | ICD-10-CM | POA: Diagnosis not present

## 2023-09-18 DIAGNOSIS — Z0000 Encounter for general adult medical examination without abnormal findings: Secondary | ICD-10-CM | POA: Diagnosis not present

## 2023-09-18 NOTE — Progress Notes (Signed)
 Subjective:   Cheryl Delgado is a 78 y.o. who presents for a Medicare Wellness preventive visit.  As a reminder, Annual Wellness Visits don't include a physical exam, and some assessments may be limited, especially if this visit is performed virtually. We may recommend an in-person follow-up visit with your provider if needed.  Visit Complete: Virtual I connected with  Cheryl Delgado on 09/18/23 by a audio enabled telemedicine application and verified that I am speaking with the correct person using two identifiers.  Patient Location: Home  Provider Location: Office/Clinic  I discussed the limitations of evaluation and management by telemedicine. The patient expressed understanding and agreed to proceed.  Vital Signs: Because this visit was a virtual/telehealth visit, some criteria may be missing or patient reported. Any vitals not documented were not able to be obtained and vitals that have been documented are patient reported.  VideoDeclined- This patient declined Librarian, academic. Therefore the visit was completed with audio only.  Persons Participating in Visit: Patient.  AWV Questionnaire: No: Patient Medicare AWV questionnaire was not completed prior to this visit.  Cardiac Risk Factors include: advanced age (>32men, >81 women);hypertension;diabetes mellitus;dyslipidemia;obesity (BMI >30kg/m2)     Objective:     Today's Vitals   09/18/23 1313  Weight: 231 lb (104.8 kg)  Height: 5\' 2"  (1.575 m)   Body mass index is 42.25 kg/m.     06/12/2023    3:20 PM 09/17/2022    2:03 PM 03/02/2021    3:46 PM  Advanced Directives  Does Patient Have a Medical Advance Directive? No No No  Would patient like information on creating a medical advance directive?  Yes (MAU/Ambulatory/Procedural Areas - Information given) No - Patient declined    Current Medications (verified) Outpatient Encounter Medications as of 09/18/2023  Medication Sig    acetaminophen  (TYLENOL ) 500 MG tablet Take 1,000 mg by mouth 2 (two) times daily as needed for moderate pain (pain score 4-6), fever or headache.   cyclobenzaprine  (FLEXERIL ) 5 MG tablet Take 1 tablet (5 mg total) by mouth 3 (three) times daily as needed for muscle spasms.   docusate sodium  (COLACE) 100 MG capsule Take 1 capsule (100 mg total) by mouth 2 (two) times daily as needed for mild constipation.   glimepiride  (AMARYL ) 4 MG tablet Take 1 tablet (4 mg total) by mouth daily with breakfast.   ibuprofen (ADVIL) 200 MG tablet Take 400 mg by mouth 2 (two) times daily as needed for headache, fever or moderate pain (pain score 4-6).   ketoconazole  (NIZORAL ) 2 % cream Apply 1 Application topically daily.   losartan  (COZAAR ) 50 MG tablet Take 1 tablet (50 mg total) by mouth daily.   metFORMIN  (GLUCOPHAGE ) 1000 MG tablet Take 1 tablet (1,000 mg total) by mouth 2 (two) times daily with a meal.   pioglitazone  (ACTOS ) 45 MG tablet Take 1 tablet (45 mg total) by mouth daily.   polyethylene glycol (MIRALAX  / GLYCOLAX ) 17 g packet Take 17 g by mouth daily as needed for moderate constipation (constipation).   rosuvastatin  (CRESTOR ) 10 MG tablet TAKE 1 TABLET(10 MG) BY MOUTH DAILY   Semaglutide  (RYBELSUS ) 7 MG TABS Take 1 tablet (7 mg total) by mouth daily.   torsemide  (DEMADEX ) 20 MG tablet Take 1 tablet (20 mg total) by mouth daily.   traMADol  (ULTRAM ) 50 MG tablet Take 1 tablet (50 mg total) by mouth every 6 (six) hours as needed for severe pain (pain score 7-10).   triamcinolone  cream (KENALOG )  0.1 % Apply 1 Application topically at bedtime.   No facility-administered encounter medications on file as of 09/18/2023.    Allergies (verified) Patient has no known allergies.   History: Past Medical History:  Diagnosis Date   Chest pain, unspecified    Edema    peripheral   Hypertension    Type II or unspecified type diabetes mellitus without mention of complication, not stated as uncontrolled     Vertigo    Past Surgical History:  Procedure Laterality Date   COLONOSCOPY  [id number]   dialtion and curettage     TOTAL ABDOMINAL HYSTERECTOMY W/ BILATERAL SALPINGOOPHORECTOMY     due to fibroid tumors and uterine bleeding   Family History  Problem Relation Age of Onset   Hyperlipidemia Mother    Diabetes Mother    Heart disease Mother    Breast cancer Neg Hx    Colon cancer Neg Hx    Stroke Neg Hx    Amblyopia Neg Hx    Blindness Neg Hx    Cataracts Neg Hx    Glaucoma Neg Hx    Macular degeneration Neg Hx    Retinal detachment Neg Hx    Strabismus Neg Hx    Retinitis pigmentosa Neg Hx    Social History   Socioeconomic History   Marital status: Married    Spouse name: Antonio Baumgarten   Number of children: 1   Years of education: 15   Highest education level: Not on file  Occupational History   Occupation: retired    Comment: Science writer  Tobacco Use   Smoking status: Every Day    Current packs/day: 0.50    Average packs/day: 0.5 packs/day for 30.0 years (15.0 ttl pk-yrs)    Types: Cigarettes   Smokeless tobacco: Never   Tobacco comments:    pt states she has smoked for a long time  Vaping Use   Vaping status: Never Used  Substance and Sexual Activity   Alcohol use: No   Drug use: No   Sexual activity: Yes    Partners: Male  Other Topics Concern   Not on file  Social History Narrative   HSG, 3 years college. Married 1971. 1 son - '78.  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.org   Social Drivers of Corporate investment banker Strain: Low Risk  (09/18/2023)   Overall Financial Resource Strain (CARDIA)    Difficulty of Paying Living Expenses: Not hard at all  Food Insecurity: No Food Insecurity (09/18/2023)   Hunger Vital Sign    Worried About Running Out of Food in the Last Year: Never true    Ran Out of Food in the Last Year: Never true  Transportation Needs:  No Transportation Needs (09/18/2023)   PRAPARE - Administrator, Civil Service (Medical): No    Lack of Transportation (Non-Medical): No  Physical Activity: Inactive (09/18/2023)   Exercise Vital Sign    Days of Exercise per Week: 0 days    Minutes of Exercise per Session: 0 min  Stress: No Stress Concern Present (09/18/2023)   [employer] of Occupational Health - Occupational Stress Questionnaire    Feeling of Stress : Not at all  Social Connections: Socially Integrated (09/18/2023)   Social Connection and Isolation Panel [NHANES]    Frequency of Communication with Friends and Family: More than three times a week    Frequency of Social  Gatherings with Friends and Family: Three times a week    Attends Religious Services: More than 4 times per year    Active Member of Clubs or Organizations: Yes    Attends Banker Meetings: Never    Marital Status: Married    Tobacco Counseling Ready to quit: Not Answered Counseling given: Not Answered Tobacco comments: pt states she has smoked for a long time    Clinical Intake:  Pre-visit preparation completed: Yes  Pain : No/denies pain     BMI - recorded: 42.25 Nutritional Status: BMI > 30  Obese Nutritional Risks: None Diabetes: Yes CBG done?: No Did pt. bring in CBG monitor from home?: No  Lab Results  Component Value Date   HGBA1C 7.9 (H) 04/22/2023   HGBA1C 7.4 09/03/2022   HGBA1C 7.9 (H) 12/07/2021     How often do you need to have someone help you when you read instructions, pamphlets, or other written materials from your doctor or pharmacy?: 1 - Never  Interpreter Needed?: No  Information entered by :: Savien Mamula, RMA   Activities of Daily Living     09/18/2023    1:14 PM  In your present state of health, do you have any difficulty performing the following activities:  Hearing? 0  Vision? 0  Difficulty concentrating or making decisions? 0  Walking or climbing stairs? 0  Dressing or  bathing? 0  Doing errands, shopping? 0  Comment husband drives her  Preparing Food and eating ? N  Using the Toilet? N  In the past six months, have you accidently leaked urine? N  Do you have problems with loss of bowel control? N  Managing your Medications? N  Managing your Finances? N  Housekeeping or managing your Housekeeping? N    Patient Care Team: Adelia Homestead, MD as PCP - General (Internal Medicine) Dorie Garfinkel, MD (Ophthalmology) Silverio Drought, MD as Consulting Physician (Ophthalmology)  I have updated your Care Teams any recent Medical Services you may have received from other providers in the past year.     Assessment:    This is a routine wellness examination for [place].  Hearing/Vision screen Hearing Screening - Comments:: Denies hearing difficulties   Vision Screening - Comments:: Wears eyeglasses/ Dr. Mason Sole   Goals Addressed             This Visit's Progress    Remain active and independent   On track      Depression Screen     09/18/2023    1:36 PM 05/05/2023    3:45 PM 09/17/2022    2:01 PM 09/03/2022    9:27 AM 08/05/2022    1:23 PM 02/12/2022   11:51 AM 12/07/2021    2:06 PM  PHQ 2/9 Scores  PHQ - 2 Score 0 0 0 0 0 0 0  PHQ- 9 Score 2  0 1       Fall Risk     09/18/2023    1:32 PM 05/05/2023    3:45 PM 04/22/2023    1:07 PM 09/17/2022    2:03 PM 09/03/2022    9:27 AM  Fall Risk   Falls in the past year? 1 1 0 0 0  Number falls in past yr: 0 0 0 0 0  Injury with Fall? 1 0 0 0 0  Comment cracked 5 ribs      Risk for fall due to : Impaired balance/gait;Medication side effect History of fall(s)  No Fall Risks  Follow up Falls evaluation completed;Falls prevention discussed Falls evaluation completed  Falls prevention discussed;Education provided;Falls evaluation completed Falls evaluation completed    MEDICARE RISK AT HOME:  Medicare Risk at Home Any stairs in or around the home?: No If so, are there any without  handrails?: No Home free of loose throw rugs in walkways, pet beds, electrical cords, etc?: Yes Adequate lighting in your home to reduce risk of falls?: Yes Life alert?: No Use of a cane, walker or w/c?: No Grab bars in the bathroom?: No Shower chair or bench in shower?: No Elevated toilet seat or a handicapped toilet?: No  TIMED UP AND GO:  Was the test performed?  No  Cognitive Function: 6CIT completed        09/18/2023    1:33 PM 09/17/2022    2:03 PM  6CIT Screen  What Year? 0 points 0 points  What month? 0 points 0 points  What time? 0 points 0 points  Count back from 20 2 points 0 points  Months in reverse 0 points 0 points  Repeat phrase 0 points 0 points  Total Score 2 points 0 points    Immunizations Immunization History  Administered Date(s) Administered   Fluad Quad(high Dose 65+) 03/14/2020, 02/12/2022   Fluad Trivalent(High Dose 65+) 01/13/2023   Influenza, High Dose Seasonal PF 01/29/2016, 03/13/2017, 03/03/2018, 03/13/2019   Influenza,inj,Quad PF,6+ Mos 01/06/2014   Influenza-Unspecified 03/15/2017, 01/28/2019   PFIZER Comirnaty(Gray Top)Covid-19 Tri-Sucrose Vaccine 11/10/2020   PFIZER(Purple Top)SARS-COV-2 Vaccination 05/04/2019, 05/25/2019   Pneumococcal Conjugate-13 01/28/2014   Pneumococcal Polysaccharide-23 10/16/2010, 06/26/2018   Td 03/16/2008    Screening Tests Health Maintenance  Topic Date Due   Zoster Vaccines- Shingrix (1 of 2) Never done   DTaP/Tdap/Td (2 - Tdap) 03/16/2018   OPHTHALMOLOGY EXAM  08/28/2018   COVID-19 Vaccine (4 - 2024-25 season) 12/15/2022   HEMOGLOBIN A1C  10/20/2023   INFLUENZA VACCINE  11/14/2023   Diabetic kidney evaluation - Urine ACR  04/21/2024   FOOT EXAM  04/21/2024   Diabetic kidney evaluation - eGFR measurement  06/12/2024   Medicare Annual Wellness (AWV)  09/17/2024   Pneumonia Vaccine 105+ Years old  Completed   DEXA SCAN  Completed   Hepatitis C Screening  Completed   HPV VACCINES  Aged Out    Meningococcal B Vaccine  Aged Out   Fecal DNA (Cologuard)  Discontinued    Health Maintenance  Health Maintenance Due  Topic Date Due   Zoster Vaccines- Shingrix (1 of 2) Never done   DTaP/Tdap/Td (2 - Tdap) 03/16/2018   OPHTHALMOLOGY EXAM  08/28/2018   COVID-19 Vaccine (4 - 2024-25 season) 12/15/2022   Health Maintenance Items Addressed: Mammogram ordered, DEXA ordered, See Nurse Notes at the end of this note  Additional Screening:  Vision Screening: Recommended annual ophthalmology exams for early detection of glaucoma and other disorders of the eye. Would you like a referral to an eye doctor? No    Dental Screening: Recommended annual dental exams for proper oral hygiene  Community Resource Referral / Chronic Care Management: CRR required this visit?  No   CCM required this visit?  No   Plan:    I have personally reviewed and noted the following in the patient's chart:   Medical and social history Use of alcohol, tobacco or illicit drugs  Current medications and supplements including opioid prescriptions. Patient is not currently taking opioid prescriptions. Functional ability and status Nutritional status Physical activity Advanced directives List of other physicians Hospitalizations, surgeries,  and ER visits in previous 12 months Vitals Screenings to include cognitive, depression, and falls Referrals and appointments  In addition, I have reviewed and discussed with patient certain preventive protocols, quality metrics, and best practice recommendations. A written personalized care plan for preventive services as well as general preventive health recommendations were provided to patient.   Paulena Servais L Adelaine Roppolo, CMA   09/18/2023   After Visit Summary: (MyChart) Due to this being a telephonic visit, the after visit summary with patients personalized plan was offered to patient via MyChart   Notes: Please refer to Routing Comments.

## 2023-09-18 NOTE — Patient Instructions (Signed)
 Ms. Thornsberry , Thank you for taking time out of your busy schedule to complete your Annual Wellness Visit with me. I enjoyed our conversation and look forward to speaking with you again next year. I, as well as your care team,  appreciate your ongoing commitment to your health goals. Please review the following plan we discussed and let me know if I can assist you in the future. Your Game plan/ To Do List    Referrals: If you haven't heard from the office you've been referred to, please reach out to them at the phone provided.  You have an order for:  [x]   3D Mammogram  [x]   Bone Density     Please call for appointment:  [facility] [address] [place], [place] [address] [phone]   Make sure to wear two-piece clothing.  No lotions, powders, or deodorants the day of the appointment. Make sure to bring picture ID and insurance card.  Bring list of medications you are currently taking including any supplements.   Follow up Visits: Next Medicare AWV with our clinical staff: 09/21/2024.   Have you seen your provider in the last 6 months (3 months if uncontrolled diabetes)? Yes Next Office Visit with your provider: Last office visit on 06/19/2023. Patient stated she will call the office for a follow up visit soon.  Clinician Recommendations:  Aim for 30 minutes of exercise or brisk walking, 6-8 glasses of water, and 5 servings of fruits and vegetables each day. You are due for a diabetic eye exam, please schedule one as soon as you can.        This is a list of the screening recommended for you and due dates:  Health Maintenance  Topic Date Due   Zoster (Shingles) Vaccine (1 of 2) Never done   DTaP/Tdap/Td vaccine (2 - Tdap) 03/16/2018   Eye exam for diabetics  08/28/2018   COVID-19 Vaccine (4 - 2024-25 season) 12/15/2022   Hemoglobin A1C  10/20/2023   Flu Shot  11/14/2023   Yearly kidney health urinalysis for diabetes  04/21/2024   Complete foot exam    04/21/2024   Yearly kidney function blood test for diabetes  06/12/2024   Medicare Annual Wellness Visit  09/17/2024   Pneumonia Vaccine  Completed   DEXA scan (bone density measurement)  Completed   Hepatitis C Screening  Completed   HPV Vaccine  Aged Out   Meningitis B Vaccine  Aged Out   Cologuard (Stool DNA test)  Discontinued    Advanced directives: (Declined) Advance directive discussed with you today. Even though you declined this today, please call our office should you change your mind, and we can give you the proper paperwork for you to fill out. Advance Care Planning is important because it:  [x]  Makes sure you receive the medical care that is consistent with your values, goals, and preferences  [x]  It provides guidance to your family and loved ones and reduces their decisional burden about whether or not they are making the right decisions based on your wishes.  Follow the link provided in your after visit summary or read over the paperwork we have mailed to you to help you started getting your Advance Directives in place. If you need assistance in completing these, please reach out to us  so that we can help you!  See attachments for Preventive Care and Fall Prevention Tips.

## 2023-09-19 ENCOUNTER — Other Ambulatory Visit: Payer: Self-pay | Admitting: Internal Medicine

## 2023-10-14 ENCOUNTER — Ambulatory Visit
Admission: RE | Admit: 2023-10-14 | Discharge: 2023-10-14 | Disposition: A | Discharge status: Home / Self Care | Source: Ambulatory Visit | Attending: Emergency Medicine

## 2023-10-14 DIAGNOSIS — Z1231 Encounter for screening mammogram for malignant neoplasm of breast: Secondary | ICD-10-CM

## 2023-10-30 ENCOUNTER — Inpatient Hospital Stay
Admission: RE | Admit: 2023-10-30 | Discharge: 2023-10-30 | Discharge status: Home / Self Care | Source: Ambulatory Visit | Attending: Internal Medicine | Admitting: Internal Medicine

## 2023-10-30 DIAGNOSIS — M8008XA Age-related osteoporosis with current pathological fracture, vertebra(e), initial encounter for fracture: Secondary | ICD-10-CM

## 2023-10-30 DIAGNOSIS — S2249XS Multiple fractures of ribs, unspecified side, sequela: Secondary | ICD-10-CM

## 2023-11-10 ENCOUNTER — Ambulatory Visit: Payer: Self-pay | Admitting: Internal Medicine

## 2023-11-13 LAB — HM DEXA SCAN: HM Dexa Scan: 0.3

## 2023-12-10 ENCOUNTER — Ambulatory Visit (INDEPENDENT_AMBULATORY_CARE_PROVIDER_SITE_OTHER): Admitting: Podiatry

## 2023-12-10 ENCOUNTER — Encounter: Payer: Self-pay | Admitting: Podiatry

## 2023-12-10 DIAGNOSIS — M79674 Pain in right toe(s): Secondary | ICD-10-CM | POA: Diagnosis not present

## 2023-12-10 DIAGNOSIS — E118 Type 2 diabetes mellitus with unspecified complications: Secondary | ICD-10-CM | POA: Diagnosis not present

## 2023-12-10 DIAGNOSIS — B351 Tinea unguium: Secondary | ICD-10-CM | POA: Diagnosis not present

## 2023-12-10 DIAGNOSIS — M79675 Pain in left toe(s): Secondary | ICD-10-CM | POA: Diagnosis not present

## 2023-12-10 MED ORDER — KETOCONAZOLE 2 % EX CREA: 1.0000 | TOPICAL_CREAM | Freq: Every day | CUTANEOUS | 2 refills | Status: AC

## 2023-12-10 MED ORDER — TRIAMCINOLONE ACETONIDE 0.1 % EX CREA: 1.0000 | TOPICAL_CREAM | Freq: Every day | CUTANEOUS | 0 refills | Status: AC

## 2023-12-10 NOTE — Progress Notes (Unsigned)
  Subjective:  Patient ID: Cheryl Delgado, female    DOB: 1945-05-27,   MRN: [id number]  Chief Complaint  Patient presents with   Diabetes    [facility] nail trim calluses on L plantar . A1c 7.9  no anti coag    78 y.o. female presents for concern of thickened elongated and painful nails that are difficult to trim. Requesting to have them trimmed today. Relates burning and tingling in their feet. Patient is diabetic and last A1c was  Lab Results  Component Value Date   HGBA1C 7.9 (H) 04/22/2023   .   PCP:  Rollene Almarie LABOR, MD       PCP:  Rollene Almarie LABOR, MD    . Denies any other pedal complaints. Denies n/v/f/c.   Past Medical History:  Diagnosis Date   Chest pain, unspecified    Edema    peripheral   Hypertension    Type II or unspecified type diabetes mellitus without mention of complication, not stated as uncontrolled    Vertigo     Objective:  Physical Exam: Vascular: DP/PT pulses 2/4 bilateral. CFT <3 seconds. Absent hair growth on digits. Edema noted to bilateral lower extremities. Xerosis noted bilaterally.  Skin. No lacerations or abrasions bilateral feet. Nails 1-5 bilateral  are thickened discolored and elongated with subungual debris. Scaling and erythema noted to platnar left midfoot with hyperkeratosis as well.  Musculoskeletal: MMT 5/5 bilateral lower extremities in DF, PF, Inversion and Eversion. Deceased ROM in DF of ankle joint.  Neurological: Sensation intact to light touch. Protective sensation diminished bilateral.    Assessment:   1. Pain due to onychomycosis of toenails of both feet        Plan:  Patient was evaluated and treated and all questions answered. -Discussed and educated patient on diabetic foot care, especially with  regards to the vascular, neurological and musculoskeletal systems.  -Stressed the importance of good glycemic control and the detriment of not  controlling glucose levels in relation to the foot. -Discussed  supportive shoes at all times and checking feet regularly.  -Mechanically debrided all nails 1-5 bilateral using sterile nail nipper and filed with dremel without incident  -Answered all patient questions -Continue triamcinalone in morning and ketoconazole  at night. Refill provided.  -Patient to return  in 3 months for rfc.   [place]berry Failing, DPM

## 2023-12-11 ENCOUNTER — Telehealth: Payer: Self-pay

## 2023-12-11 ENCOUNTER — Other Ambulatory Visit: Payer: Self-pay

## 2023-12-11 MED ORDER — ECONAZOLE NITRATE 1 % EX CREA: TOPICAL_CREAM | Freq: Every day | CUTANEOUS | 0 refills | Status: AC

## 2023-12-11 NOTE — Telephone Encounter (Signed)
 Received a faxed request from [facility] ketoconazole  (NIZORAL ) 2 % cream     Not covered by insurance - alternatives include clotrimazole cream, econazole cream, or oxiconazole cream. Please advise if any oof these would be appropriate. thanks

## 2023-12-22 ENCOUNTER — Other Ambulatory Visit: Payer: Self-pay | Admitting: Internal Medicine

## 2024-03-10 ENCOUNTER — Ambulatory Visit (INDEPENDENT_AMBULATORY_CARE_PROVIDER_SITE_OTHER): Admitting: Podiatry

## 2024-03-10 DIAGNOSIS — M79674 Pain in right toe(s): Secondary | ICD-10-CM

## 2024-03-10 DIAGNOSIS — M79675 Pain in left toe(s): Secondary | ICD-10-CM | POA: Diagnosis not present

## 2024-03-10 DIAGNOSIS — E118 Type 2 diabetes mellitus with unspecified complications: Secondary | ICD-10-CM

## 2024-03-10 DIAGNOSIS — B351 Tinea unguium: Secondary | ICD-10-CM | POA: Diagnosis not present

## 2024-03-10 DIAGNOSIS — S90822A Blister (nonthermal), left foot, initial encounter: Secondary | ICD-10-CM

## 2024-03-10 NOTE — Progress Notes (Signed)
  Subjective:  Patient ID: Cheryl Delgado, female    DOB: 30-Jun-1945,   MRN: [id number]  Chief Complaint  Patient presents with   RFC    RFC Diabetes A1c 7.9. no anti coag    78 y.o. female presents for concern of thickened elongated and painful nails that are difficult to trim. Requesting to have them trimmed today. Relates burning and tingling in their feet. Patient is diabetic and last A1c was  Lab Results  Component Value Date   HGBA1C 7.9 (H) 04/22/2023   .  Nuclear  Blister on the bottom of the left foot.  That has been present for a few weeks.  Denies much pain. PCP:  Rollene Almarie LABOR, MD       PCP:  Rollene Almarie LABOR, MD    . Denies any other pedal complaints. Denies n/v/f/c.   Past Medical History:  Diagnosis Date   Chest pain, unspecified    Edema    peripheral   Hypertension    Type II or unspecified type diabetes mellitus without mention of complication, not stated as uncontrolled    Vertigo     Objective:  Physical Exam: Vascular: DP/PT pulses 2/4 bilateral. CFT <3 seconds. Absent hair growth on digits. Edema noted to bilateral lower extremities. Xerosis noted bilaterally.  Skin. No lacerations or abrasions bilateral feet. Nails 1-5 bilateral  are thickened discolored and elongated with subungual debris. Scaling and erythema noted to platnar left midfoot with hyperkeratosis as well.  Blister noted in middle plantar left foot area upon debridement skin raw but well-healed. Musculoskeletal: MMT 5/5 bilateral lower extremities in DF, PF, Inversion and Eversion. Deceased ROM in DF of ankle joint.  Neurological: Sensation intact to light touch. Protective sensation diminished bilateral.    Assessment:   1. Pain due to onychomycosis of toenails of both feet   2. Diabetes mellitus type 2 with complications (HCC)         Plan:  Patient was evaluated and treated and all questions answered. -Discussed and educated patient on diabetic foot care,  especially with  regards to the vascular, neurological and musculoskeletal systems.  -Stressed the importance of good glycemic control and the detriment of not  controlling glucose levels in relation to the foot. -Discussed supportive shoes at all times and checking feet regularly.  -Mechanically debrided all nails 1-5 bilateral using sterile nail nipper and filed with dremel without incident  -Blister lanced and drained of fluid.  Area debrided and underlying skin well-healed.  Advised to keep Neosporin and a Band-Aid on it until skin fully healed. -Answered all patient questions -Continue triamcinalone in morning and ketoconazole  at night. Refill provided.  -Patient to return  in 3 months for rfc.   Asberry Failing, DPM

## 2024-03-21 ENCOUNTER — Other Ambulatory Visit: Payer: Self-pay | Admitting: Internal Medicine

## 2024-05-03 ENCOUNTER — Other Ambulatory Visit: Payer: Self-pay | Admitting: Internal Medicine

## 2024-06-16 ENCOUNTER — Ambulatory Visit: Admitting: Podiatry

## 2024-09-21 ENCOUNTER — Ambulatory Visit
